# Patient Record
Sex: Female | Born: 1935
Health system: Southern US, Community
[De-identification: ages and names within clinical notes are randomized; demographics above are authoritative.]

## PROBLEM LIST (undated history)

## (undated) DIAGNOSIS — R8281 Pyuria: Secondary | ICD-10-CM

## (undated) DIAGNOSIS — I110 Hypertensive heart disease with heart failure: Secondary | ICD-10-CM

## (undated) DIAGNOSIS — G459 Transient cerebral ischemic attack, unspecified: Secondary | ICD-10-CM

## (undated) DIAGNOSIS — D649 Anemia, unspecified: Secondary | ICD-10-CM

## (undated) DIAGNOSIS — D0512 Intraductal carcinoma in situ of left breast: Secondary | ICD-10-CM

## (undated) DIAGNOSIS — R2990 Unspecified symptoms and signs involving the nervous system: Secondary | ICD-10-CM

## (undated) DIAGNOSIS — S83281A Other tear of lateral meniscus, current injury, right knee, initial encounter: Secondary | ICD-10-CM

## (undated) DIAGNOSIS — E871 Hypo-osmolality and hyponatremia: Secondary | ICD-10-CM

## (undated) DIAGNOSIS — I2510 Atherosclerotic heart disease of native coronary artery without angina pectoris: Secondary | ICD-10-CM

## (undated) DIAGNOSIS — Z9889 Other specified postprocedural states: Secondary | ICD-10-CM

## (undated) DIAGNOSIS — E785 Hyperlipidemia, unspecified: Secondary | ICD-10-CM

## (undated) DIAGNOSIS — N814 Uterovaginal prolapse, unspecified: Secondary | ICD-10-CM

## (undated) DIAGNOSIS — E876 Hypokalemia: Secondary | ICD-10-CM

## (undated) DIAGNOSIS — E118 Type 2 diabetes mellitus with unspecified complications: Secondary | ICD-10-CM

## (undated) DIAGNOSIS — Z6831 Body mass index (BMI) 31.0-31.9, adult: Secondary | ICD-10-CM

## (undated) DIAGNOSIS — S83104A Unspecified dislocation of right knee, initial encounter: Secondary | ICD-10-CM

## (undated) DIAGNOSIS — E119 Type 2 diabetes mellitus without complications: Secondary | ICD-10-CM

## (undated) DIAGNOSIS — I5042 Chronic combined systolic (congestive) and diastolic (congestive) heart failure: Secondary | ICD-10-CM

## (undated) DIAGNOSIS — I10 Essential (primary) hypertension: Secondary | ICD-10-CM

## (undated) DIAGNOSIS — I6523 Occlusion and stenosis of bilateral carotid arteries: Secondary | ICD-10-CM

## (undated) DIAGNOSIS — I639 Cerebral infarction, unspecified: Secondary | ICD-10-CM

## (undated) DIAGNOSIS — K219 Gastro-esophageal reflux disease without esophagitis: Secondary | ICD-10-CM

## (undated) DIAGNOSIS — E669 Obesity, unspecified: Secondary | ICD-10-CM

## (undated) DIAGNOSIS — M25561 Pain in right knee: Secondary | ICD-10-CM

## (undated) DIAGNOSIS — I160 Hypertensive urgency: Secondary | ICD-10-CM

## (undated) DIAGNOSIS — C50919 Malignant neoplasm of unspecified site of unspecified female breast: Secondary | ICD-10-CM

## (undated) HISTORY — DX: Cerebral infarction, unspecified: I63.9

## (undated) HISTORY — DX: Chronic combined systolic (congestive) and diastolic (congestive) heart failure: I50.42

## (undated) HISTORY — PX: BREAST BIOPSY: SHX20

## (undated) HISTORY — PX: EYE SURGERY: SHX253

## (undated) HISTORY — DX: Body mass index (bmi) 31.0-31.9, adult: Z68.31

## (undated) HISTORY — DX: Other specified postprocedural states: Z98.890

## (undated) HISTORY — DX: Obesity, unspecified: E66.9

## (undated) HISTORY — PX: CHOLECYSTECTOMY: SHX55

## (undated) HISTORY — DX: Other tear of lateral meniscus, current injury, right knee, initial encounter: S83.281A

## (undated) HISTORY — DX: Type 2 diabetes mellitus with unspecified complications: E11.8

## (undated) HISTORY — DX: Pyuria: R82.81

## (undated) HISTORY — DX: Atherosclerotic heart disease of native coronary artery without angina pectoris: I25.10

## (undated) HISTORY — DX: Hypokalemia: E87.6

## (undated) HISTORY — DX: Unspecified symptoms and signs involving the nervous system: R29.90

## (undated) HISTORY — DX: Hypertensive heart disease with heart failure: I11.0

## (undated) HISTORY — DX: Unspecified dislocation of right knee, initial encounter: S83.104A

## (undated) HISTORY — PX: CORONARY ANGIOPLASTY WITH STENT PLACEMENT: SHX49

## (undated) HISTORY — DX: Pain in right knee: M25.561

## (undated) HISTORY — DX: Intraductal carcinoma in situ of left breast: D05.12

## (undated) HISTORY — PX: TUBAL LIGATION: SHX77

## (undated) HISTORY — DX: Anemia, unspecified: D64.9

## (undated) HISTORY — DX: Occlusion and stenosis of bilateral carotid arteries: I65.23

## (undated) HISTORY — DX: Uterovaginal prolapse, unspecified: N81.4

## (undated) HISTORY — DX: Hypo-osmolality and hyponatremia: E87.1

## (undated) HISTORY — DX: Hypertensive urgency: I16.0

## (undated) HISTORY — PX: CATARACT EXTRACTION: SUR2

## (undated) HISTORY — DX: Hyperlipidemia, unspecified: E78.5

## (undated) MED FILL — Ferumoxytol Inj 510 MG/17ML (30 MG/ML) (Elemental Fe): INTRAVENOUS | Qty: 17 | Status: AC

---

## 1999-05-20 ENCOUNTER — Encounter: Admission: RE | Admit: 1999-05-20 | Discharge: 1999-05-20 | Payer: Self-pay | Admitting: Obstetrics and Gynecology

## 1999-05-20 ENCOUNTER — Encounter: Payer: Self-pay | Admitting: Obstetrics and Gynecology

## 1999-06-26 ENCOUNTER — Other Ambulatory Visit: Admission: RE | Admit: 1999-06-26 | Discharge: 1999-06-26 | Payer: Self-pay | Admitting: Obstetrics and Gynecology

## 2000-06-07 ENCOUNTER — Encounter: Payer: Self-pay | Admitting: Obstetrics and Gynecology

## 2000-06-07 ENCOUNTER — Encounter: Admission: RE | Admit: 2000-06-07 | Discharge: 2000-06-07 | Payer: Self-pay | Admitting: Obstetrics and Gynecology

## 2000-07-09 ENCOUNTER — Other Ambulatory Visit: Admission: RE | Admit: 2000-07-09 | Discharge: 2000-07-09 | Payer: Self-pay | Admitting: Obstetrics and Gynecology

## 2001-06-08 ENCOUNTER — Encounter: Payer: Self-pay | Admitting: Obstetrics and Gynecology

## 2001-06-08 ENCOUNTER — Encounter: Admission: RE | Admit: 2001-06-08 | Discharge: 2001-06-08 | Payer: Self-pay | Admitting: Obstetrics and Gynecology

## 2002-07-03 ENCOUNTER — Encounter: Payer: Self-pay | Admitting: Obstetrics and Gynecology

## 2002-07-03 ENCOUNTER — Encounter: Admission: RE | Admit: 2002-07-03 | Discharge: 2002-07-03 | Payer: Self-pay | Admitting: Obstetrics and Gynecology

## 2008-07-10 DIAGNOSIS — S32009A Unspecified fracture of unspecified lumbar vertebra, initial encounter for closed fracture: Secondary | ICD-10-CM

## 2008-07-10 HISTORY — DX: Unspecified fracture of unspecified lumbar vertebra, initial encounter for closed fracture: S32.009A

## 2014-03-15 DIAGNOSIS — S83104A Unspecified dislocation of right knee, initial encounter: Secondary | ICD-10-CM

## 2014-03-15 DIAGNOSIS — S83281A Other tear of lateral meniscus, current injury, right knee, initial encounter: Secondary | ICD-10-CM

## 2014-03-15 HISTORY — DX: Other tear of lateral meniscus, current injury, right knee, initial encounter: S83.281A

## 2014-03-15 HISTORY — DX: Unspecified dislocation of right knee, initial encounter: S83.104A

## 2014-05-17 DIAGNOSIS — M25561 Pain in right knee: Secondary | ICD-10-CM | POA: Insufficient documentation

## 2014-05-17 DIAGNOSIS — Z9889 Other specified postprocedural states: Secondary | ICD-10-CM

## 2014-05-17 HISTORY — DX: Other specified postprocedural states: Z98.890

## 2014-05-17 HISTORY — DX: Pain in right knee: M25.561

## 2014-07-17 DIAGNOSIS — I50812 Chronic right heart failure: Secondary | ICD-10-CM | POA: Insufficient documentation

## 2014-07-17 DIAGNOSIS — E785 Hyperlipidemia, unspecified: Secondary | ICD-10-CM

## 2014-07-17 DIAGNOSIS — I2510 Atherosclerotic heart disease of native coronary artery without angina pectoris: Secondary | ICD-10-CM

## 2014-07-17 DIAGNOSIS — I110 Hypertensive heart disease with heart failure: Secondary | ICD-10-CM

## 2014-07-17 DIAGNOSIS — I5042 Chronic combined systolic (congestive) and diastolic (congestive) heart failure: Secondary | ICD-10-CM

## 2014-07-17 HISTORY — DX: Hypertensive heart disease with heart failure: I11.0

## 2014-07-17 HISTORY — DX: Chronic combined systolic (congestive) and diastolic (congestive) heart failure: I50.42

## 2014-07-17 HISTORY — DX: Hyperlipidemia, unspecified: E78.5

## 2014-07-17 HISTORY — DX: Atherosclerotic heart disease of native coronary artery without angina pectoris: I25.10

## 2015-01-02 DIAGNOSIS — Z86000 Personal history of in-situ neoplasm of breast: Secondary | ICD-10-CM | POA: Diagnosis not present

## 2015-05-06 DIAGNOSIS — D0512 Intraductal carcinoma in situ of left breast: Secondary | ICD-10-CM

## 2015-05-06 DIAGNOSIS — Z6831 Body mass index (BMI) 31.0-31.9, adult: Secondary | ICD-10-CM | POA: Insufficient documentation

## 2015-05-06 DIAGNOSIS — E669 Obesity, unspecified: Secondary | ICD-10-CM

## 2015-05-06 HISTORY — DX: Intraductal carcinoma in situ of left breast: D05.12

## 2015-05-06 HISTORY — DX: Body mass index (BMI) 31.0-31.9, adult: Z68.31

## 2015-05-06 HISTORY — DX: Obesity, unspecified: E66.9

## 2015-05-29 ENCOUNTER — Observation Stay (HOSPITAL_COMMUNITY)
Admission: EM | Admit: 2015-05-29 | Discharge: 2015-05-31 | Disposition: A | Discharge status: Home / Self Care | Payer: Medicare PPO | Attending: Internal Medicine | Admitting: Internal Medicine

## 2015-05-29 ENCOUNTER — Encounter (HOSPITAL_COMMUNITY): Payer: Self-pay | Admitting: Neurology

## 2015-05-29 ENCOUNTER — Observation Stay (HOSPITAL_COMMUNITY): Payer: Medicare PPO

## 2015-05-29 DIAGNOSIS — R4781 Slurred speech: Secondary | ICD-10-CM | POA: Diagnosis not present

## 2015-05-29 DIAGNOSIS — E119 Type 2 diabetes mellitus without complications: Secondary | ICD-10-CM | POA: Diagnosis not present

## 2015-05-29 DIAGNOSIS — I639 Cerebral infarction, unspecified: Secondary | ICD-10-CM | POA: Diagnosis not present

## 2015-05-29 DIAGNOSIS — I10 Essential (primary) hypertension: Secondary | ICD-10-CM | POA: Diagnosis not present

## 2015-05-29 DIAGNOSIS — Z8673 Personal history of transient ischemic attack (TIA), and cerebral infarction without residual deficits: Secondary | ICD-10-CM | POA: Insufficient documentation

## 2015-05-29 DIAGNOSIS — Z87891 Personal history of nicotine dependence: Secondary | ICD-10-CM | POA: Insufficient documentation

## 2015-05-29 DIAGNOSIS — Z7984 Long term (current) use of oral hypoglycemic drugs: Secondary | ICD-10-CM | POA: Diagnosis not present

## 2015-05-29 DIAGNOSIS — R531 Weakness: Secondary | ICD-10-CM | POA: Diagnosis present

## 2015-05-29 DIAGNOSIS — E871 Hypo-osmolality and hyponatremia: Secondary | ICD-10-CM

## 2015-05-29 DIAGNOSIS — K219 Gastro-esophageal reflux disease without esophagitis: Secondary | ICD-10-CM | POA: Diagnosis present

## 2015-05-29 DIAGNOSIS — E785 Hyperlipidemia, unspecified: Secondary | ICD-10-CM | POA: Insufficient documentation

## 2015-05-29 DIAGNOSIS — Z79899 Other long term (current) drug therapy: Secondary | ICD-10-CM | POA: Diagnosis not present

## 2015-05-29 DIAGNOSIS — Z853 Personal history of malignant neoplasm of breast: Secondary | ICD-10-CM | POA: Diagnosis not present

## 2015-05-29 DIAGNOSIS — Z7902 Long term (current) use of antithrombotics/antiplatelets: Secondary | ICD-10-CM | POA: Diagnosis not present

## 2015-05-29 DIAGNOSIS — R29818 Other symptoms and signs involving the nervous system: Secondary | ICD-10-CM | POA: Diagnosis not present

## 2015-05-29 DIAGNOSIS — R2990 Unspecified symptoms and signs involving the nervous system: Secondary | ICD-10-CM | POA: Insufficient documentation

## 2015-05-29 DIAGNOSIS — E118 Type 2 diabetes mellitus with unspecified complications: Secondary | ICD-10-CM | POA: Diagnosis not present

## 2015-05-29 HISTORY — DX: Unspecified symptoms and signs involving the nervous system: R29.90

## 2015-05-29 HISTORY — DX: Gastro-esophageal reflux disease without esophagitis: K21.9

## 2015-05-29 HISTORY — DX: Essential (primary) hypertension: I10

## 2015-05-29 HISTORY — DX: Type 2 diabetes mellitus without complications: E11.9

## 2015-05-29 HISTORY — DX: Transient cerebral ischemic attack, unspecified: G45.9

## 2015-05-29 HISTORY — DX: Malignant neoplasm of unspecified site of unspecified female breast: C50.919

## 2015-05-29 HISTORY — DX: Hypo-osmolality and hyponatremia: E87.1

## 2015-05-29 LAB — BASIC METABOLIC PANEL
Anion gap: 12 (ref 5–15)
BUN: 16 mg/dL (ref 6–20)
CO2: 21 mmol/L — ABNORMAL LOW (ref 22–32)
Calcium: 9.2 mg/dL (ref 8.9–10.3)
Chloride: 94 mmol/L — ABNORMAL LOW (ref 101–111)
Creatinine, Ser: 0.81 mg/dL (ref 0.44–1.00)
GFR calc Af Amer: >60 mL/min (ref >60)
GFR calc non Af Amer: >60 mL/min (ref >60)
Glucose, Bld: 106 mg/dL — ABNORMAL HIGH (ref 65–99)
Potassium: 4.5 mmol/L (ref 3.5–5.1)
Sodium: 127 mmol/L — ABNORMAL LOW (ref 135–145)

## 2015-05-29 LAB — LIPID PANEL
CHOLESTEROL: 119 mg/dL (ref 0–200)
HDL: 36 mg/dL — ABNORMAL LOW (ref >40)
LDL Cholesterol: 61 mg/dL (ref 0–99)
TRIGLYCERIDES: 108 mg/dL (ref <150)
Total CHOL/HDL Ratio: 3.3 RATIO
VLDL: 22 mg/dL (ref 0–40)

## 2015-05-29 LAB — GLUCOSE, CAPILLARY: GLUCOSE-CAPILLARY: 136 mg/dL — AB (ref 65–99)

## 2015-05-29 LAB — CBG MONITORING, ED: GLUCOSE-CAPILLARY: 104 mg/dL — AB (ref 65–99)

## 2015-05-29 LAB — OSMOLALITY: Osmolality: 273 mOsm/kg — ABNORMAL LOW (ref 275–295)

## 2015-05-29 MED ORDER — CLOPIDOGREL BISULFATE 75 MG PO TABS
75.0000 mg | ORAL_TABLET | Freq: Every morning | ORAL | Status: DC
  Administered 2015-05-30 – 2015-05-31 (×2): 75 mg via ORAL
  Filled 2015-05-29 (×2): qty 1

## 2015-05-29 MED ORDER — ROSUVASTATIN CALCIUM 10 MG PO TABS
10.0000 mg | ORAL_TABLET | ORAL | Status: DC
  Administered 2015-05-31: 10 mg via ORAL
  Filled 2015-05-29: qty 1

## 2015-05-29 MED ORDER — SODIUM CHLORIDE 0.9 % IV SOLN
INTRAVENOUS | Status: AC
  Administered 2015-05-29: 21:00:00 via INTRAVENOUS

## 2015-05-29 MED ORDER — STROKE: EARLY STAGES OF RECOVERY BOOK
Freq: Once | Status: DC
  Filled 2015-05-29 (×2): qty 1

## 2015-05-29 MED ORDER — INSULIN ASPART 100 UNIT/ML ~~LOC~~ SOLN
0.0000 [IU] | Freq: Three times a day (TID) | SUBCUTANEOUS | Status: DC
  Administered 2015-05-30: 1 [IU] via SUBCUTANEOUS

## 2015-05-29 MED ORDER — ACETAMINOPHEN 325 MG PO TABS: 650.0000 mg | ORAL_TABLET | ORAL | Status: DC | PRN

## 2015-05-29 MED ORDER — PANTOPRAZOLE SODIUM 20 MG PO TBEC
20.0000 mg | DELAYED_RELEASE_TABLET | Freq: Every day | ORAL | Status: DC
  Administered 2015-05-30 – 2015-05-31 (×2): 20 mg via ORAL
  Filled 2015-05-29 (×2): qty 1

## 2015-05-29 MED ORDER — SENNOSIDES-DOCUSATE SODIUM 8.6-50 MG PO TABS
1.0000 | ORAL_TABLET | Freq: Every evening | ORAL | Status: DC | PRN
  Administered 2015-05-30: 1 via ORAL
  Filled 2015-05-29: qty 1

## 2015-05-29 MED ORDER — IOPAMIDOL (ISOVUE-370) INJECTION 76%
INTRAVENOUS | Status: AC
  Administered 2015-05-29: 50 mL
  Filled 2015-05-29: qty 50

## 2015-05-29 MED ORDER — HYDRALAZINE HCL 20 MG/ML IJ SOLN: 5.0000 mg | INTRAMUSCULAR | Status: DC | PRN

## 2015-05-29 MED ORDER — ACETAMINOPHEN 650 MG RE SUPP: 650.0000 mg | RECTAL | Status: DC | PRN

## 2015-05-29 MED ORDER — INSULIN ASPART 100 UNIT/ML ~~LOC~~ SOLN: 0.0000 [IU] | Freq: Every day | SUBCUTANEOUS | Status: DC

## 2015-05-29 NOTE — ED Provider Notes (Signed)
CSN: [id number]     Arrival date & time 05/29/15  1518 History   None    Chief Complaint  Patient presents with  . Stroke Symptoms     (Consider location/radiation/quality/duration/timing/severity/associated sxs/prior Treatment) Patient is a 82 y.o. female presenting with general illness. The history is provided by the patient and medical records.  Illness Location:  Stroke like symptoms Severity:  Moderate Onset quality:  Sudden Duration:  1 hour Timing:  Constant Progression:  Resolved Chronicity:  New Context:  Patient had acute onset of right hand weakness and slurred speech at 11 AM. Last 1 hour. Spontaneously resolved. Went outside [place]. CT head without acute abnormality. Sodium down to be 127. Patient does have a history of chronic hyponatremia. Sent here. Associated symptoms: no abdominal pain, no chest pain, no cough, no diarrhea, no fever, no headaches, no nausea, no shortness of breath and no vomiting     Past Medical History  Diagnosis Date  . Hypertension   . Diabetes mellitus without complication ([place])   . TIA (transient ischemic attack)   . GERD (gastroesophageal reflux disease)   . Breast cancer [facility])    Past Surgical History  Procedure Laterality Date  . Cholecystectomy    . Tubal ligation    . Eye surgery    . Cataract extraction     No family history on file. Social History  Substance Use Topics  . Smoking status: Former Research scientist (life sciences)  . Smokeless tobacco: None  . Alcohol Use: No   OB History    No data available     Review of Systems  Constitutional: Negative for fever and chills.  Respiratory: Negative for cough and shortness of breath.   Cardiovascular: Negative for chest pain.  Gastrointestinal: Negative for nausea, vomiting, abdominal pain and diarrhea.  Musculoskeletal: Negative for back pain and neck pain.  Neurological: Positive for speech difficulty and weakness. Negative for syncope, facial asymmetry, light-headedness, numbness  and headaches.  All other systems reviewed and are negative.     Allergies  Antihistamines, diphenhydramine-type; Aspirin; Lipitor; Penicillins; and Allopurinol  Home Medications   Prior to Admission medications   Not on File   BP 194/66 mmHg  Pulse 56  Temp(Src) 98.7 F (37.1 C) (Oral)  Resp 18  SpO2 100% Physical Exam  Constitutional: She is oriented to person, place, and time. She appears well-developed and well-nourished. No distress.  HENT:  Head: Normocephalic and atraumatic.  Eyes: EOM are normal. Pupils are equal, round, and reactive to light.  Neck: Normal range of motion. No rigidity. Normal range of motion present.  Cardiovascular: Normal rate and regular rhythm.   Pulmonary/Chest: No tachypnea. No respiratory distress.  Abdominal: Soft. Normal appearance. There is no tenderness.  Neurological: She is alert and oriented to person, place, and time. She has normal strength and normal reflexes. No cranial nerve deficit or sensory deficit. She displays a negative Romberg sign. Coordination normal. GCS eye subscore is 4. GCS verbal subscore is 5. GCS motor subscore is 6.  Patient does have some decreased strength in her left lower extremity compared to her right lower extremity; she reports this is consistent with her baseline since she had polio when she was a child.  Skin: Skin is warm and dry.    ED Course  Procedures (including critical care time) Labs Review Labs Reviewed  BASIC METABOLIC PANEL    Imaging Review No results found. I have personally reviewed and evaluated these images and lab results as part of my  medical decision-making.   EKG Interpretation None      MDM   Final diagnoses:  Stroke-like symptom  Stroke-like symptom    Transfer from OSH. R hand weakness and change in speech, lasted 1 hr, resolved. CTH at OSH - normal. Na at OSH - 127. Sent here.   Symptom-free on arrival. No abnormal findings on exam. Afebrile and well appearing. No  meningeal signs. No HA. No n/v. Doubt intracranial bleed, meningitis or encephalitis.   Spoke with neurology Dr Silverio Decamp, who agreed with plan to get CTA H+N, and to admit to hospitalist for TIA r/o.  Maryan Puls, MD 05/29/15 2035  Leo Grosser, MD 05/30/15 [phone]

## 2015-05-29 NOTE — Consult Note (Signed)
Requesting Physician: Dr. Laneta Simmers    Chief Complaint: CVA    HPI:                                                                                                                                         Marilyn Robinson is an 82 y.o. female who was drinking her coffee at 10:45 and noted she was unable to control her right hand and then noted she had slurred speech.  She called her daughter who called EMS and she was brought to [facility]. She was then transferred to [facility] ED. Her symptoms resolved in about one hour and she is asymptomatic. She has had similar symptoms in 2010 but on the right arm and left leg. This was attributed to hyponatremia. She does take Plavix daily  Date last known well: 5.17.2017 Time last known well: Time: 10:45 tPA Given: No: resolved   Past Medical History  Diagnosis Date  . Hypertension   . Diabetes mellitus without complication ([place])   . TIA (transient ischemic attack)   . GERD (gastroesophageal reflux disease)   . Breast cancer [facility])     Past Surgical History  Procedure Laterality Date  . Cholecystectomy    . Tubal ligation    . Eye surgery    . Cataract extraction      No family history on file. Social History:  reports that she has quit smoking. She does not have any smokeless tobacco history on file. She reports that she does not drink alcohol. Her drug history is not on file.  Allergies:  Allergies  Allergen Reactions  . Antihistamines, Diphenhydramine-Type Other (See Comments)    Causes headaches  . Aspirin Nausea And Vomiting  . Lipitor [Atorvastatin] Other (See Comments)    Caused muscle weakness, but can tolerate a low dose of Crestor  . Penicillins     Has patient had a PCN reaction causing immediate rash, facial/tongue/throat swelling, SOB or lightheadedness with hypotension: Yes Has patient had a PCN reaction causing severe rash involving [id number] membranes or skin necrosis: No Has patient had a PCN reaction that  required hospitalization: No Has patient had a PCN reaction occurring within the last 10 years: No If all of the above answers are "NO", then may proceed with Cephalosporin use.   . Allopurinol Rash    Medications:  No current facility-administered medications for this encounter.   No current outpatient prescriptions on file.     ROS:                                                                                                                                       History obtained from the patient  General ROS: negative for - chills, fatigue, fever, night sweats, weight gain or weight loss Psychological ROS: negative for - behavioral disorder, hallucinations, memory difficulties, mood swings or suicidal ideation Ophthalmic ROS: negative for - blurry vision, double vision, eye pain or loss of vision ENT ROS: negative for - epistaxis, nasal discharge, oral lesions, sore throat, tinnitus or vertigo Allergy and Immunology ROS: negative for - hives or itchy/watery eyes Hematological and Lymphatic ROS: negative for - bleeding problems, bruising or swollen lymph nodes Endocrine ROS: negative for - galactorrhea, hair pattern changes, polydipsia/polyuria or temperature intolerance Respiratory ROS: negative for - cough, hemoptysis, shortness of breath or wheezing Cardiovascular ROS: negative for - chest pain, dyspnea on exertion, edema or irregular heartbeat Gastrointestinal ROS: negative for - abdominal pain, diarrhea, hematemesis, nausea/vomiting or stool incontinence Genito-Urinary ROS: negative for - dysuria, hematuria, incontinence or urinary frequency/urgency Musculoskeletal ROS: negative for - joint swelling or muscular weakness Neurological ROS: as noted in HPI Dermatological ROS: negative for rash and skin lesion changes  Neurologic Examination:                                                                                                       Blood pressure 194/66, pulse 56, temperature 98.7 F (37.1 C), temperature source Oral, resp. rate 18, SpO2 100 %.  HEENT-  Normocephalic, no lesions, without obvious abnormality.  Normal external eye and conjunctiva.  Normal TM's bilaterally.  Normal auditory canals and external ears. Normal external nose, mucus membranes and septum.  Normal pharynx. Cardiovascular- S1, S2 normal, pulses palpable throughout   Lungs- chest clear, no wheezing, rales, normal symmetric air entry Abdomen- normal findings: bowel sounds normal Extremities- no edema Lymph-no adenopathy palpable Musculoskeletal-no joint tenderness, deformity or swelling Skin-warm and dry, no hyperpigmentation, vitiligo, or suspicious lesions  Neurological Examination Mental Status: Alert, oriented, thought content appropriate.  Speech fluent without evidence of aphasia.  Able to follow 3 step commands without difficulty. Cranial Nerves: II: Visual fields grossly normal, pupils equal, round, reactive to light and accommodation III,IV, VI: ptosis not present, extra-ocular motions intact bilaterally V,VII: smile asymmetric on the left (old bells), facial light touch  sensation normal bilaterally VIII: hearing normal bilaterally IX,X: uvula rises symmetrically XI: bilateral shoulder shrug XII: midline tongue extension Motor: Right : Upper extremity   5/5    Left:     Upper extremity   5/5  Lower extremity   4/5     Lower extremity   4/5 Tone and bulk:normal tone throughout; no atrophy noted Sensory: Pinprick and light touch intact throughout, bilaterally Deep Tendon Reflexes: 1+ and symmetric throughout Plantars: Right: downgoing   Left: downgoing Cerebellar: normal finger-to-nose Gait: not tested       Lab Results: Basic Metabolic Panel: No results for input(s): NA, K, CL, CO2, GLUCOSE, BUN, CREATININE, CALCIUM, MG, PHOS in the last 168  hours.  Liver Function Tests: No results for input(s): AST, ALT, ALKPHOS, BILITOT, PROT, ALBUMIN in the last 168 hours. No results for input(s): LIPASE, AMYLASE in the last 168 hours. No results for input(s): AMMONIA in the last 168 hours.  CBC: No results for input(s): WBC, NEUTROABS, HGB, HCT, MCV, PLT in the last 168 hours.  Cardiac Enzymes: No results for input(s): CKTOTAL, CKMB, CKMBINDEX, TROPONINI in the last 168 hours.  Lipid Panel: No results for input(s): CHOL, TRIG, HDL, CHOLHDL, VLDL, LDLCALC in the last 168 hours.  CBG: No results for input(s): GLUCAP in the last 168 hours.  Microbiology: No results found for this or any previous visit.  Coagulation Studies: No results for input(s): LABPROT, INR in the last 72 hours.  Imaging: No results found.     Assessment and plan discussed with with attending physician and they are in agreement.    Etta Quill PA-C [facility] [phone]  05/29/2015, 3:54 PM   Assessment: 82 y.o. female with transient slurred speech and right hand clumsiness. This has fully fully resolved. Ca nnot rule out TIA.   Stroke Risk Factors - diabetes mellitus and hypertension  1. HgbA1c, fasting lipid panel 2.  CTA head and neck, MRI brain 3. PT consult, OT consult, Speech consult 4. Echocardiogram 5. Prophylactic therapy-Antiplatelet med: Plavix - dose 75 mg daily 6. Risk factor modification 7. Telemetry monitoring 8. Frequent neuro checks 9 NPO until passes stroke swallow screen 10 please page stroke NP  Or  PA  Or MD from 8am -4 pm  as this patient from this time will be  followed by the stroke.   You can look them up on [email or web address]  Password [email or web address]

## 2015-05-29 NOTE — ED Notes (Signed)
Attempted report 

## 2015-05-29 NOTE — ED Notes (Signed)
Patient transported to X-ray 

## 2015-05-29 NOTE — ED Notes (Signed)
Per ems- Pt was LSN at 1055 while talking on the phone noticed her right hand wouldn't work right, and wouldn't do what she wanted it to do. Also had slurred speech. BP was 200, given 10 mg labetalol, recent BP [id number] systolic. Pt is a x 4. No deficits at current.

## 2015-05-29 NOTE — H&P (Signed)
[facility] History and Physical  Marilyn Robinson [id number] DOB: Nov 23, 1933 DOA: 05/29/2015  Referring physician:  PCP: No primary care provider on file. [place]  Chief Complaint:   HPI: Marilyn Robinson is a delightful 82 y.o. female with past medical history that includes hyponatremia, hypertension, diabetes, GERD since emergency department from [place] and [place] with the chief complaint of slurred speech. Initial evaluation concerning for CVA so patient was transported to the [facility] emergency department.  Information is obtained from the patient and the chart. She states she was in her usual state of health until this morning around 1045 when she noted difficulty maneuvering her right hand. She states it was week she had difficulty holding her coffee cup or using the mouse on her computer. Associated symptoms include slurred speech and generalized weakness. She denied any weakness of her right arm shoulder right leg right foot. She denied any gait disturbances. She denied headache visual disturbances numbness or tingling of extremities. She denies chest pain palpitation headache syncope or near-syncope. She denies abdominal pain nausea vomiting dysuria hematuria frequency or urgency. She does report some intermittent constipation. States she called her daughter who called EMS and the patient was taken to [facility]. The time of admission symptoms are resolved. She reports having had a similar episode 7 years ago and right arm and left leg that was attributed to low sodium. Reports this episode lasted about an hour.  Emergency department she's afebrile hemodynamically stable and not hypoxic. Volume [place] chest CT of the head that showed no acute abnormality EKG without acute changes with bradycardia. Lab work revealed a hyponatremia   Review of Systems:  10 point review of systems complete and all systems are negative except as indicated in the history of present  illness   Past Medical History  Diagnosis Date  . Hypertension   . Diabetes mellitus without complication ([place])   . TIA (transient ischemic attack)   . GERD (gastroesophageal reflux disease)   . Breast cancer [facility])    Past Surgical History  Procedure Laterality Date  . Cholecystectomy    . Tubal ligation    . Eye surgery    . Cataract extraction     Social History:  reports that she has quit smoking. She does not have any smokeless tobacco history on file. She reports that she does not drink alcohol. Her drug history is not on file. To his home alone she is independent with ADLs she ambulates unassisted Allergies  Allergen Reactions  . Antihistamines, Diphenhydramine-Type Other (See Comments)    Causes headaches  . Aspirin Nausea And Vomiting  . Lipitor [Atorvastatin] Other (See Comments)    Caused muscle weakness, but can tolerate a low dose of Crestor  . Penicillins     Has patient had a PCN reaction causing immediate rash, facial/tongue/throat swelling, SOB or lightheadedness with hypotension: Yes Has patient had a PCN reaction causing severe rash involving [id number] membranes or skin necrosis: No Has patient had a PCN reaction that required hospitalization: No Has patient had a PCN reaction occurring within the last 10 years: No If all of the above answers are "NO", then may proceed with Cephalosporin use.   . Allopurinol Rash    No family history on file. family medical hx reviewed and non contributory to admission of this elderly lady  Prior to Admission medications   Medication Sig Start Date End Date Taking? Authorizing Provider  acetaminophen (TYLENOL) 500 MG tablet Take 500 mg by mouth every  6 (six) hours as needed for mild pain.   Yes Historical Provider, MD  carvedilol (COREG) 25 MG tablet Take 37.5 mg by mouth 2 (two) times daily.   Yes Historical Provider, MD  celecoxib (CELEBREX) 200 MG capsule Take 200 mg by mouth daily as needed for mild pain.  05/17/14   Yes Historical Provider, MD  clopidogrel (PLAVIX) 75 MG tablet Take 75 mg by mouth every morning. 01/08/14  Yes Historical Provider, MD  cyanocobalamin (,VITAMIN B-12,) 1000 MCG/ML injection Inject 1 mL into the muscle every 30 (thirty) days.   Yes Historical Provider, MD  fluconazole (DIFLUCAN) 100 MG tablet Take 100 mg by mouth daily. 05/20/15  Yes Historical Provider, MD  furosemide (LASIX) 20 MG tablet Take 20 mg by mouth daily as needed.   Yes Historical Provider, MD  hydrALAZINE (APRESOLINE) 10 MG tablet Take 5 mg by mouth 2 (two) times daily.   Yes Historical Provider, MD  losartan (COZAAR) 50 MG tablet Take 50 mg by mouth 2 (two) times daily. 02/06/14  Yes Historical Provider, MD  metFORMIN (GLUCOPHAGE-XR) 500 MG 24 hr tablet Take 500-1,000 mg by mouth 2 (two) times daily. 500 mg in the morning and 1,000 at night 12/04/13  Yes Historical Provider, MD  pantoprazole (PROTONIX) 20 MG tablet Take 20 mg by mouth daily before breakfast.   Yes Historical Provider, MD  rosuvastatin (CRESTOR) 10 MG tablet Take 10 mg by mouth every other day. 01/26/14   Historical Provider, MD   Physical Exam: Filed Vitals:   05/29/15 1529 05/29/15 1600  BP: 194/66 156/65  Pulse: 56 57  Temp: 98.7 F (37.1 C)   TempSrc: Oral   Resp: 18 12  SpO2: 100% 100%    Wt Readings from Last 3 Encounters:  No data found for Wt    General:  Appears calm and comfortable, sitting up in bed Eyes: PERRL, normal lids, irises & conjunctiva ENT: grossly normal hearing, lips & tongue, his membranes of her mouth are moist and pink Neck: no LAD, masses or thyromegaly Cardiovascular: RRR, no m/r/g. No LE edema. Pedal pulses present and palpable  Respiratory: CTA bilaterally, no w/r/r. Normal respiratory effort. Abdomen: soft, ntnd positive bowel sounds no guarding or rebounding Skin: no rash or induration seen on limited exam Musculoskeletal: grossly normal tone BUE/BLE, joints without swelling/erythema Psychiatric: grossly  normal mood and affect, speech fluent and appropriate Neurologic: grossly non-focal. Speech clear facial symmetry tongue midline. Bilateral grip 5 out of 5 lower extremity strength bilaterally 5 out of 5 no pronator drift sensation intact          Labs on Admission:  Basic Metabolic Panel: No results for input(s): NA, K, CL, CO2, GLUCOSE, BUN, CREATININE, CALCIUM, MG, PHOS in the last 168 hours. Liver Function Tests: No results for input(s): AST, ALT, ALKPHOS, BILITOT, PROT, ALBUMIN in the last 168 hours. No results for input(s): LIPASE, AMYLASE in the last 168 hours. No results for input(s): AMMONIA in the last 168 hours. CBC: No results for input(s): WBC, NEUTROABS, HGB, HCT, MCV, PLT in the last 168 hours. Cardiac Enzymes: No results for input(s): CKTOTAL, CKMB, CKMBINDEX, TROPONINI in the last 168 hours.  BNP (last 3 results) No results for input(s): BNP in the last 8760 hours.  ProBNP (last 3 results) No results for input(s): PROBNP in the last 8760 hours.  CBG: No results for input(s): GLUCAP in the last 168 hours.  Radiological Exams on Admission: No results found.  EKG:   Assessment/Plan Principal Problem:  Stroke-like episode ([place]) Active Problems:   Hyponatremia   Hypertension   Diabetes mellitus without complication ([facility])   GERD (gastroesophageal reflux disease)  #1. Stroke like episode. History of same. Risk factors include hypertension and diabetes and currently has hyponatremia. CT head completed at [facility] reveals no acute abnormality. Slurred speech and right hand weakness resolved within one hour. Evaluated by neurology in the emergency department who recommend TIA workup -Admit to telemetry -MRI of the brain -CTA head and neck -2-D echo -Lipid panel and hemoglobin A1c -Continue home Plavix -Physical therapy, occupational therapy -She passed bedside swallow eval so we'll provide heart healthy carb modified diet -Continue statin  #2. Hyponatremia.  Sodium 127 her [employer]. Patient reports a history of hyponatremia is unsure of baseline. Home medications to include Lasix -Gentle IV fluids -Serum osmolality -urine sodium and osmolality -recheck in am  #3. Hypertension. Fair control while in the emergency department. Home medications include Coreg, Lasix, hydralazine, losartan. -We will hold these for now. Will likely start with hydralazine when indicated -Hold Lasix due to #2 hold beta blocker secondary to intermittent bradycardia -Monitor closely  4. Diabetes. She is on metformin at home. Serum glucose 130 on admission -We'll hold metformin for now -Carb modified diet -Obtain a hemoglobin A1c -Use sliding scale insulin for optimal control  #5. GERD. Stable at baseline. She reports she hasn't had her PPI in over a week. -Continue home medications   neurology  Code Status: limited DVT Prophylaxis: Family Communication: daughter at bedside Disposition Plan: home when ready  Time spent: 1 minutes  [facility]

## 2015-05-30 ENCOUNTER — Observation Stay (HOSPITAL_COMMUNITY): Payer: Medicare PPO

## 2015-05-30 DIAGNOSIS — R29818 Other symptoms and signs involving the nervous system: Secondary | ICD-10-CM | POA: Diagnosis not present

## 2015-05-30 DIAGNOSIS — I639 Cerebral infarction, unspecified: Secondary | ICD-10-CM | POA: Diagnosis not present

## 2015-05-30 DIAGNOSIS — E119 Type 2 diabetes mellitus without complications: Secondary | ICD-10-CM

## 2015-05-30 DIAGNOSIS — R4182 Altered mental status, unspecified: Secondary | ICD-10-CM | POA: Diagnosis not present

## 2015-05-30 DIAGNOSIS — E871 Hypo-osmolality and hyponatremia: Secondary | ICD-10-CM | POA: Diagnosis not present

## 2015-05-30 DIAGNOSIS — I10 Essential (primary) hypertension: Secondary | ICD-10-CM | POA: Diagnosis not present

## 2015-05-30 DIAGNOSIS — K219 Gastro-esophageal reflux disease without esophagitis: Secondary | ICD-10-CM | POA: Diagnosis not present

## 2015-05-30 LAB — ECHOCARDIOGRAM COMPLETE: WEIGHTICAEL: 2912 [oz_av]

## 2015-05-30 LAB — GLUCOSE, CAPILLARY
GLUCOSE-CAPILLARY: 119 mg/dL — AB (ref 65–99)
Glucose-Capillary: 144 mg/dL — ABNORMAL HIGH (ref 65–99)
Glucose-Capillary: 110 mg/dL — ABNORMAL HIGH (ref 65–99)
Glucose-Capillary: 218 mg/dL — ABNORMAL HIGH (ref 65–99)

## 2015-05-30 LAB — BASIC METABOLIC PANEL
Anion gap: 10 (ref 5–15)
BUN: 12 mg/dL (ref 6–20)
CALCIUM: 8.8 mg/dL — AB (ref 8.9–10.3)
CO2: 21 mmol/L — ABNORMAL LOW (ref 22–32)
CREATININE: 0.79 mg/dL (ref 0.44–1.00)
Chloride: 94 mmol/L — ABNORMAL LOW (ref 101–111)
GFR calc Af Amer: >60 mL/min (ref >60)
GLUCOSE: 189 mg/dL — AB (ref 65–99)
Potassium: 4.3 mmol/L (ref 3.5–5.1)
Sodium: 125 mmol/L — ABNORMAL LOW (ref 135–145)

## 2015-05-30 LAB — HEMOGLOBIN A1C
Hgb A1c MFr Bld: 7.2 % — ABNORMAL HIGH (ref 4.8–5.6)
MEAN PLASMA GLUCOSE: 160 mg/dL

## 2015-05-30 MED ORDER — SODIUM CHLORIDE 0.9 % IV SOLN
INTRAVENOUS | Status: DC
  Administered 2015-05-30 – 2015-05-31 (×2): via INTRAVENOUS

## 2015-05-30 MED ORDER — ASPIRIN EC 81 MG PO TBEC
81.0000 mg | DELAYED_RELEASE_TABLET | Freq: Every day | ORAL | Status: DC
  Administered 2015-05-30 – 2015-05-31 (×2): 81 mg via ORAL
  Filled 2015-05-30 (×2): qty 1

## 2015-05-30 NOTE — Progress Notes (Signed)
STROKE TEAM PROGRESS NOTE   HISTORY OF PRESENT ILLNESS Marilyn Robinson is an 82 y.o. female who was drinking her coffee at 10:45 05/29/2015 (LKW) and noted she was unable to control her right hand and then noted she had slurred speech. She called her daughter who called EMS and she was brought to [facility]. She was then transferred to [facility] ED. Her symptoms resolved in about one hour and she is asymptomatic. She has had similar symptoms in 2010 but on the right arm and left leg. This was attributed to hyponatremia. She does take Plavix daily. Patient was not administered IV t-PA secondary to resolved symptoms. She was admitted for further evaluation and treatment.   SUBJECTIVE (INTERVAL HISTORY) Her daughter is at the bedside.  Overall she feels her condition is completely resolved. She has right hand weakness with word finding difficulty and then generalized weakness lasted one hour yesterday. Her Na 125. Not sure about her Na baseline. She also had similar episode in 2010 with whole body weakness and difficulty words out. At that time her Na was low.    OBJECTIVE Temp:  [98.2 F (36.8 C)-98.9 F (37.2 C)] 98.9 F (37.2 C) (05/18 0758) Pulse Rate:  [51-65] 61 (05/18 0918) Cardiac Rhythm:  [-] Normal sinus rhythm (05/18 0704) Resp:  [12-19] 18 (05/18 0918) BP: (124-194)/(43-70) 155/51 mmHg (05/18 0918) SpO2:  [95 %-100 %] 95 % (05/18 0918) Weight:  [82.555 kg (182 lb)] 82.555 kg (182 lb) (05/17 2108)  CBC: No results for input(s): WBC, NEUTROABS, HGB, HCT, MCV, PLT in the last 168 hours.  Basic Metabolic Panel:  Recent Labs Lab 05/29/15 1549  NA 127*  K 4.5  CL 94*  CO2 21*  GLUCOSE 106*  BUN 16  CREATININE 0.81  CALCIUM 9.2    Lipid Panel:    Component Value Date/Time   CHOL 119 05/29/2015 1549   TRIG 108 05/29/2015 1549   HDL 36* 05/29/2015 1549   CHOLHDL 3.3 05/29/2015 1549   VLDL 22 05/29/2015 1549   LDLCALC 61 05/29/2015 1549   HgbA1c:  Lab Results   Component Value Date   HGBA1C 7.2* 05/29/2015   Urine Drug Screen: No results found for: LABOPIA, COCAINSCRNUR, LABBENZ, AMPHETMU, THCU, LABBARB    IMAGING I have personally reviewed the radiological images below and agree with the radiology interpretations.  Ct Angio Head & Neck W/cm &/or Wo/cm 05/29/2015  1. Moderate stenosis at the origin of the left common carotid artery. 2. Approximately 50% stenosis at the origin of the right internal carotid artery with atherosclerotic disease and a focal ulceration. A 3 mm pseudoaneurysm is present. 3. Atherosclerotic calcifications and ulceration at the proximal left internal carotid artery without a significant stenosis. 4. Atherosclerotic disease within the cavernous internal carotid arteries bilaterally without significant stenosis.  Mr Brain Wo Contrast 05/29/2015  1. No acute intracranial abnormality to explain the patient's acute symptoms. 2. Advanced diffuse white matter disease with multiple remote lacunar infarcts compatible with chronic microvascular ischemic change. 3. Moderate generalized atrophy.   TTE - - Left ventricle: The cavity size was normal. Wall thickness was  increased in a pattern of moderate LVH. There was moderate  concentric hypertrophy. Systolic function was normal. The  estimated ejection fraction was in the range of 55% to 60%. Wall  motion was normal; there were no regional wall motion  abnormalities. - Mitral valve: Mildly calcified annulus. There was mild  regurgitation. - Left atrium: The atrium was mildly dilated. Impressions: - No  cardiac source of emboli was indentified.  EEG pending  PHYSICAL EXAM  Temp:  [97.9 F (36.6 C)-98.9 F (37.2 C)] 98 F (36.7 C) (05/18 1800) Pulse Rate:  [51-88] 88 (05/18 1800) Resp:  [13-19] 18 (05/18 1800) BP: (124-166)/(43-78) 152/78 mmHg (05/18 1407) SpO2:  [95 %-100 %] 99 % (05/18 1800) Weight:  [182 lb (82.555 kg)] 182 lb (82.555 kg) (05/17 2108)  General -  Well nourished, well developed, in no apparent distress.  Ophthalmologic - Fundi not visualized due to eye movement.  Cardiovascular - Regular rate and rhythm.  Mental Status -  Level of arousal and orientation to time, place, and person were intact. Language including expression, naming, repetition, comprehension was assessed and found intact.  Cranial Nerves II - XII - II - Visual field intact OU. III, IV, VI - Extraocular movements intact. V - Facial sensation intact bilaterally. VII - Facial movement intact bilaterally. VIII - Hearing & vestibular intact bilaterally. X - Palate elevates symmetrically. XI - Chin turning & shoulder shrug intact bilaterally. XII - Tongue protrusion intact.  Motor Strength - The patient's strength was normal in all extremities and pronator drift was absent.  Bulk was normal and fasciculations were absent.   Motor Tone - Muscle tone was assessed at the neck and appendages and was normal.  Reflexes - The patient's reflexes were 1+ in all extremities and she had no pathological reflexes.  Sensory - Light touch, temperature/pinprick were assessed and were symmetrical.    Coordination - The patient had normal movements in the hands and feet with no ataxia or dysmetria.  Tremor was absent.  Gait and Station - not tested due to safety concerns.   ASSESSMENT/PLAN Ms. Marilyn Robinson is a 82 y.o. female with history of HTN, DM, TIA, GERD, breast cancer presenting with transient slurred speech and right hand clumsiness. She did not receive IV t-PA due to deficits resolved.   Left brain cortical TIA - with right hand weakness and aphasia. Pt has similar episode in 2010. Recommend 30 day cardiac event monitoring.  Resultant  Deficit resolved  MRI  No acute stroke, but microbleeds left BG  CTA H&N  R ICA 50% w/ ulceration , L ICA ulceration without stenosis  2D Echo  EF 55-60%  Recommend 30 day cardiac event monitoring as outpt  LDL 61  HgbA1c  7.2  SCDs for VTE prophylaxis.   Diet Heart Room service appropriate?: Yes; Fluid consistency:: Thin  clopidogrel 75 mg daily prior to admission, now on clopidogrel 75 mg daily. Due to intracranial stenosis, we recommend DAPT. However, pt seems not able to tolerate ASA in the past, but she is willing to try again ASA EC 81mg  for 3 months together with plavix. After 3 months, plavix alone.  Patient counseled to be compliant with her antithrombotic medications  Ongoing aggressive stroke risk factor management  Therapy recommendations:  pending  Disposition:  Pending  Hyponatremia  Pt stated that her first episode in 2010 was related with low Na  This admission, Na 125  Pt Na 10/2012 was 132 and 01/2015 was 130  EEG pending to rule out seizure due to low Na  Hypertension  Stable  Permissive hypertension (OK if < 220/120) but gradually normalize in 5-7 days  Hyperlipidemia  Home meds:  crestor 10, resumed in hospital  LDL 61, at goal < 70  Continue statin at discharge  Diabetes  HgbA1c 7.2 , at goal < 7.0  Controlled  SSI   CBG monitoring  Other Stroke Risk Factors  Advanced age  Former Cigarette smoker  Hx stroke/TIA  2010 word finding difficulty with generalized weakness  Other Active Problems  Hyponatremia  GERD  Hospital day # 1  Rosalin Hawking, MD PhD Stroke Neurology 05/30/2015 6:25 PM      To contact Stroke Continuity provider, please refer to [email or web address] After hours, contact General Neurology

## 2015-05-30 NOTE — Progress Notes (Signed)
EEG completed; results pending.    

## 2015-05-30 NOTE — Progress Notes (Signed)
Echocardiogram 2D Echocardiogram has been performed.  Marilyn Robinson 05/30/2015, 1:02 PM

## 2015-05-30 NOTE — Progress Notes (Signed)
PROGRESS NOTE    Marilyn Robinson  [id number] DOB: November 02, 1933 DOA: 05/29/2015 PCP: No primary care provider on file.   Brief Narrative:  On 05/29/2015 by Ms. Dyanne Carrel, NP Marilyn Robinson is a delightful 82 y.o. female with past medical history that includes hyponatremia, hypertension, diabetes, GERD since emergency department from [facility] and [place] with the chief complaint of slurred speech. Initial evaluation concerning for CVA so patient was transported to the [facility] emergency department.  Information is obtained from the patient and the chart. She states she was in her usual state of health until this morning around 1045 when she noted difficulty maneuvering her right hand. She states it was week she had difficulty holding her coffee cup or using the mouse on her computer. Associated symptoms include slurred speech and generalized weakness. She denied any weakness of her right arm shoulder right leg right foot. She denied any gait disturbances. She denied headache visual disturbances numbness or tingling of extremities. She denies chest pain palpitation headache syncope or near-syncope. She denies abdominal pain nausea vomiting dysuria hematuria frequency or urgency. She does report some intermittent constipation. States she called her daughter who called EMS and the patient was taken to [facility]. The time of admission symptoms are resolved. She reports having had a similar episode 7 years ago and right arm and left leg that was attributed to low sodium. Reports this episode lasted about an hour.  Emergency department she's afebrile hemodynamically stable and not hypoxic. Volume [place] chest CT of the head that showed no acute abnormality EKG without acute changes with bradycardia. Lab work revealed a hyponatremia Assessment & Plan   Slurred speech/right-hand weakness, suspect TIA versus CVA -CT head showed no acute abnormality -MRI brain: No acute intracranial  abnormality, advanced diffuse white matter disease with multiple remote lacunar infarcts -Neurology consulted and appreciated -Echocardiogram pending -Continue Plavix, statin -LDL 61 -Hemoglobin A1c 7.2  Hyponatremia -Sodium 125 -Will obtain TSH  Diabetes mellitus, type II -Metformin held -Placed on insulin sliding scale CBG monitoring -Hemoglobin A1c 7.2  Essential hypertension -Home medications held, allowing for permissive hypertension  GERD -Continue PPI  DVT Prophylaxis    Code Status: Limited: DNI/DNR, only BiPAP  Family Communication: Daughter at bedsdie  Disposition Plan: Admitted. Pending further workup  Consultants Neurology  Procedures  Echocardiogram  Antibiotics   Anti-infectives    None      Subjective:   Marilyn Robinson seen and examined today.  Patient denies any further confusion, speech problems or right hand weakness. Denies any headache, shortness of breath, chest pain,, pain, nausea or vomiting, diarrhea or constipation.  Objective:   Filed Vitals:   05/30/15 0423 05/30/15 0555 05/30/15 0758 05/30/15 0918  BP: 133/70 146/59 152/47 155/51  Pulse: 52 52 57 61  Temp: 98.2 F (36.8 C)  98.9 F (37.2 C)   TempSrc: Oral  Oral   Resp: 19 18 18 18   Weight:      SpO2: 100% 98%  95%    Intake/Output Summary (Last 24 hours) at 05/30/15 1321 Last data filed at 05/30/15 0931  Gross per 24 hour  Intake    240 ml  Output      0 ml  Net    240 ml   Filed Weights   05/29/15 2108  Weight: 82.555 kg (182 lb)    Exam  General: Well developed, well nourished, NAD, appears stated age  HEENT: NCAT, PERRLA, EOMI, Anicteic Sclera, mucous membranes moist.   Neck: Supple,  no JVD, no masses  Cardiovascular: S1 S2 auscultated, no rubs, murmurs or gallops. Regular rate and rhythm.  Respiratory: Clear to auscultation bilaterally  Abdomen: Soft, nontender, nondistended, + bowel sounds  Extremities: warm dry without cyanosis clubbing or  edema  Neuro: AAOx3, cranial nerves grossly intact. Strength 5/5 in patient's upper and lower extremities bilaterally  Psych: Normal affect and demeanor    Data Reviewed: I have personally reviewed following labs and imaging studies  CBC: No results for input(s): WBC, NEUTROABS, HGB, HCT, MCV, PLT in the last 168 hours. Basic Metabolic Panel:  Recent Labs Lab 05/29/15 1549 05/30/15 1036  NA 127* 125*  K 4.5 4.3  CL 94* 94*  CO2 21* 21*  GLUCOSE 106* 189*  BUN 16 12  CREATININE 0.81 0.79  CALCIUM 9.2 8.8*   GFR: CrCl cannot be calculated (Unknown ideal weight.). Liver Function Tests: No results for input(s): AST, ALT, ALKPHOS, BILITOT, PROT, ALBUMIN in the last 168 hours. No results for input(s): LIPASE, AMYLASE in the last 168 hours. No results for input(s): AMMONIA in the last 168 hours. Coagulation Profile: No results for input(s): INR, PROTIME in the last 168 hours. Cardiac Enzymes: No results for input(s): CKTOTAL, CKMB, CKMBINDEX, TROPONINI in the last 168 hours. BNP (last 3 results) No results for input(s): PROBNP in the last 8760 hours. HbA1C:  Recent Labs  05/29/15 1549  HGBA1C 7.2*   CBG:  Recent Labs Lab 05/29/15 1913 05/29/15 2124 05/30/15 0756 05/30/15 1149  GLUCAP 104* 136* 119* 144*   Lipid Profile:  Recent Labs  05/29/15 1549  CHOL 119  HDL 36*  LDLCALC 61  TRIG 108  CHOLHDL 3.3   Thyroid Function Tests: No results for input(s): TSH, T4TOTAL, FREET4, T3FREE, THYROIDAB in the last 72 hours. Anemia Panel: No results for input(s): VITAMINB12, FOLATE, FERRITIN, TIBC, IRON, RETICCTPCT in the last 72 hours. Urine analysis: No results found for: COLORURINE, APPEARANCEUR, LABSPEC, PHURINE, GLUCOSEU, HGBUR, BILIRUBINUR, KETONESUR, PROTEINUR, UROBILINOGEN, NITRITE, LEUKOCYTESUR Sepsis Labs: @LABRCNTIP (procalcitonin:4,lacticidven:4)  )No results found for this or any previous visit (from the past 240 hour(s)).    Radiology Studies: Ct  Angio Head W/cm &/or Wo Cm  05/29/2015  CLINICAL DATA:  Code stroke earlier today with right-sided weakness and dysphagia. Symptoms have since resolved. EXAM: CT ANGIOGRAPHY HEAD AND NECK TECHNIQUE: Multidetector CT imaging of the head and neck was performed using the standard protocol during bolus administration of intravenous contrast. Multiplanar CT image reconstructions and MIPs were obtained to evaluate the vascular anatomy. Carotid stenosis measurements (when applicable) are obtained utilizing NASCET criteria, using the distal internal carotid diameter as the denominator. CONTRAST:  50 mL Isovue 370 COMPARISON:  MRI brain and CT head without contrast from the same day. FINDINGS: CT HEAD Brain: Remote lacunar infarcts basal ganglia are again noted bilaterally. Extensive white matter disease is noted bilaterally. No acute cortical infarct, hemorrhage, or mass lesion is present. The ventricles are of normal size. No significant extra-axial fluid collection is present. Calvarium and skull base: Within normal limits. Atherosclerotic calcifications are seen within the cavernous internal carotid arteries bilaterally. Paranasal sinuses: Clear Orbits: Bilateral lens replacements are present. The globes and orbits are otherwise intact. CTA NECK Aortic arch: Atherosclerotic calcifications are present at the aortic arch. There is a moderate stenosis at the origin of the left common carotid artery. No other significant stenosis is present. Right carotid system: The right common carotid artery is tortuous. Calcifications are present in the distal right common carotid artery and at the bifurcation.  There is a focal ulceration at the right carotid bifurcation with a 3 mm pseudoaneurysm. The native lumen is narrowed to 2 mm. Focal tortuosity is present in the more distal cervical right ICA with some regularity but no other focal stenosis. Left carotid system: The left common carotid artery is within normal limits.  Calcifications are present at the carotid bifurcation. There is a focal ulceration without significant stenosis. The cervical left ICA is tortuous. Vertebral arteries:The vertebral arteries both originate from the subclavian arteries. There is no significant stenosis of either vertebral artery in the neck. Calcifications are present at dural margin on the left without significant stenosis. Skeleton: Grade 1 anterolisthesis at C3-4 measures 3 mm. Endplate change in uncovertebral disease is worse on the left at C4-5 and C5-6 on the right at C5-6 and C6-7. Vertebral body heights alignment are maintained. No focal lytic or blastic lesions are present. Other neck: The thyroid is mildly enlarged, without a dominant lesion. No significant cervical adenopathy is present. The a salivary glands are within normal limits. No focal mucosal or submucosal lesions are evident. The lung apices demonstrate centrilobular emphysematous change without focal nodule, mass, or airspace disease. CTA HEAD Anterior circulation: Dense calcifications are present within the cavernous internal carotid arteries bilaterally. There is no significant focal stenosis. The ICA termini are intact. The A1 and M1 segments are within normal limits. The right A1 segment is dominant. The anterior communicating artery is patent. MCA bifurcations are intact bilaterally. MCA and ACA branch vessels are within normal limits. Posterior circulation: Left vertebral artery is dominant vessel. The PICA origins are visualized and normal bilaterally. The vertebral basilar junction is intact. A fetal type left posterior cerebral artery is present. A smaller right posterior communicating artery is present with primary feeding to the right PCA from a P1 segment. The PCA branch vessels are intact. Venous sinuses: The dural sinuses are patent. The right transverse sinus is dominant. The straight sinus and deep cerebral veins are intact. The cortical veins are within normal  limits. Anatomic variants: Fetal type left posterior cerebral artery. Prominent right posterior communicating artery. Delayed phase: The postcontrast images demonstrate no pathologic enhancement. IMPRESSION: 1. Moderate stenosis at the origin of the left common carotid artery. 2. Approximately 50% stenosis at the origin of the right internal carotid artery with atherosclerotic disease and a focal ulceration. A 3 mm pseudoaneurysm is present. 3. Atherosclerotic calcifications and ulceration at the proximal left internal carotid artery without a significant stenosis. 4. Atherosclerotic disease within the cavernous internal carotid arteries bilaterally without significant stenosis. Electronically Signed   By: San Morelle M.D.   On: 05/29/2015 19:50   Ct Angio Neck W/cm &/or Wo/cm  05/29/2015  CLINICAL DATA:  Code stroke earlier today with right-sided weakness and dysphagia. Symptoms have since resolved. EXAM: CT ANGIOGRAPHY HEAD AND NECK TECHNIQUE: Multidetector CT imaging of the head and neck was performed using the standard protocol during bolus administration of intravenous contrast. Multiplanar CT image reconstructions and MIPs were obtained to evaluate the vascular anatomy. Carotid stenosis measurements (when applicable) are obtained utilizing NASCET criteria, using the distal internal carotid diameter as the denominator. CONTRAST:  50 mL Isovue 370 COMPARISON:  MRI brain and CT head without contrast from the same day. FINDINGS: CT HEAD Brain: Remote lacunar infarcts basal ganglia are again noted bilaterally. Extensive white matter disease is noted bilaterally. No acute cortical infarct, hemorrhage, or mass lesion is present. The ventricles are of normal size. No significant extra-axial fluid collection is present.  Calvarium and skull base: Within normal limits. Atherosclerotic calcifications are seen within the cavernous internal carotid arteries bilaterally. Paranasal sinuses: Clear Orbits: Bilateral  lens replacements are present. The globes and orbits are otherwise intact. CTA NECK Aortic arch: Atherosclerotic calcifications are present at the aortic arch. There is a moderate stenosis at the origin of the left common carotid artery. No other significant stenosis is present. Right carotid system: The right common carotid artery is tortuous. Calcifications are present in the distal right common carotid artery and at the bifurcation. There is a focal ulceration at the right carotid bifurcation with a 3 mm pseudoaneurysm. The native lumen is narrowed to 2 mm. Focal tortuosity is present in the more distal cervical right ICA with some regularity but no other focal stenosis. Left carotid system: The left common carotid artery is within normal limits. Calcifications are present at the carotid bifurcation. There is a focal ulceration without significant stenosis. The cervical left ICA is tortuous. Vertebral arteries:The vertebral arteries both originate from the subclavian arteries. There is no significant stenosis of either vertebral artery in the neck. Calcifications are present at dural margin on the left without significant stenosis. Skeleton: Grade 1 anterolisthesis at C3-4 measures 3 mm. Endplate change in uncovertebral disease is worse on the left at C4-5 and C5-6 on the right at C5-6 and C6-7. Vertebral body heights alignment are maintained. No focal lytic or blastic lesions are present. Other neck: The thyroid is mildly enlarged, without a dominant lesion. No significant cervical adenopathy is present. The a salivary glands are within normal limits. No focal mucosal or submucosal lesions are evident. The lung apices demonstrate centrilobular emphysematous change without focal nodule, mass, or airspace disease. CTA HEAD Anterior circulation: Dense calcifications are present within the cavernous internal carotid arteries bilaterally. There is no significant focal stenosis. The ICA termini are intact. The A1 and  M1 segments are within normal limits. The right A1 segment is dominant. The anterior communicating artery is patent. MCA bifurcations are intact bilaterally. MCA and ACA branch vessels are within normal limits. Posterior circulation: Left vertebral artery is dominant vessel. The PICA origins are visualized and normal bilaterally. The vertebral basilar junction is intact. A fetal type left posterior cerebral artery is present. A smaller right posterior communicating artery is present with primary feeding to the right PCA from a P1 segment. The PCA branch vessels are intact. Venous sinuses: The dural sinuses are patent. The right transverse sinus is dominant. The straight sinus and deep cerebral veins are intact. The cortical veins are within normal limits. Anatomic variants: Fetal type left posterior cerebral artery. Prominent right posterior communicating artery. Delayed phase: The postcontrast images demonstrate no pathologic enhancement. IMPRESSION: 1. Moderate stenosis at the origin of the left common carotid artery. 2. Approximately 50% stenosis at the origin of the right internal carotid artery with atherosclerotic disease and a focal ulceration. A 3 mm pseudoaneurysm is present. 3. Atherosclerotic calcifications and ulceration at the proximal left internal carotid artery without a significant stenosis. 4. Atherosclerotic disease within the cavernous internal carotid arteries bilaterally without significant stenosis. Electronically Signed   By: Marin Roberts M.D.   On: 05/29/2015 19:50   Mr Brain Wo Contrast  05/29/2015  CLINICAL DATA:  New onset slurred speech and difficulty using her right hand. She denies weakness. Symptoms were resolved at the time of presentation. EXAM: MRI HEAD WITHOUT CONTRAST TECHNIQUE: Multiplanar, multiecho pulse sequences of the brain and surrounding structures were obtained without intravenous contrast. COMPARISON:  CT head  without contrast 05/29/2015 FINDINGS: The  diffusion-weighted images demonstrate no evidence for acute or subacute infarction. No acute hemorrhage or mass lesion is present. Advanced confluent periventricular and subcortical white matter changes are present bilaterally. There are remote lacunar infarcts of the basal ganglia bilaterally, left thalamus, in the right lateral pons. A remote lacunar infarct is present in the posterior left cerebellum. White matter changes extend into the brainstem. No significant extraaxial fluid collection is present. The internal auditory canals are within normal limits bilaterally. Flow is present in the major intracranial arteries. Bilateral lens replacements are present. The globes and orbits are intact. The paranasal sinuses and the mastoid air cells are clear. IMPRESSION: 1. No acute intracranial abnormality to explain the patient's acute symptoms. 2. Advanced diffuse white matter disease with multiple remote lacunar infarcts compatible with chronic microvascular ischemic change. 3. Moderate generalized atrophy. Electronically Signed   By: San Morelle M.D.   On: 05/29/2015 17:55     Scheduled Meds: .  stroke: mapping our early stages of recovery book   Does not apply Once  . clopidogrel  75 mg Oral q morning - 10a  . insulin aspart  0-5 Units Subcutaneous QHS  . insulin aspart  0-9 Units Subcutaneous TID WC  . pantoprazole  20 mg Oral QAC breakfast  . rosuvastatin  10 mg Oral QODAY   Continuous Infusions:      Time Spent in minutes   30 minutes  Lynnwood Beckford D.O. on 05/30/2015 at 1:21 PM  Between 7am to 7pm - Pager - [phone]  After 7pm go to [email or web address] - password [email or web address]  And look for the night coverage person covering for me after hours  [facility] Office  [phone]

## 2015-05-30 NOTE — Progress Notes (Signed)
PT Cancellation Note  Patient Details Name: Marilyn Robinson MRN: [id number] DOB: 13-Jul-1933   Cancelled Treatment:    Reason Eval/Treat Not Completed: PT screened, no needs identified, will sign off. The patient and family member both report pt is at baseline.   [name] 05/30/2015, 3:30 PM [place]

## 2015-05-30 NOTE — Progress Notes (Signed)
Pt admitted from ED per stretcher accompanied by a nurse tech and pt family, self introduced to pt, ID bracelet checked, fall prevention plan discuss with pt and family,admission package given, pt oriented to the unit and equipment,treatment started as prescribed, call light and phone within reach and pt able to demonstrate how to use them, will continue to monitor

## 2015-05-30 NOTE — Care Management Obs Status (Signed)
MEDICARE OBSERVATION STATUS NOTIFICATION   Patient Details  Name: Marilyn Robinson MRN: [id number] Date of Birth: [birth year]   Medicare Observation Status Notification Given:  Yes    Sharin Mons, RN 05/30/2015, 11:55 AM

## 2015-05-31 DIAGNOSIS — E871 Hypo-osmolality and hyponatremia: Secondary | ICD-10-CM | POA: Diagnosis not present

## 2015-05-31 DIAGNOSIS — E119 Type 2 diabetes mellitus without complications: Secondary | ICD-10-CM | POA: Diagnosis not present

## 2015-05-31 DIAGNOSIS — I10 Essential (primary) hypertension: Secondary | ICD-10-CM | POA: Diagnosis not present

## 2015-05-31 DIAGNOSIS — R29818 Other symptoms and signs involving the nervous system: Secondary | ICD-10-CM | POA: Diagnosis not present

## 2015-05-31 DIAGNOSIS — K219 Gastro-esophageal reflux disease without esophagitis: Secondary | ICD-10-CM | POA: Diagnosis not present

## 2015-05-31 DIAGNOSIS — I639 Cerebral infarction, unspecified: Secondary | ICD-10-CM | POA: Diagnosis not present

## 2015-05-31 LAB — BASIC METABOLIC PANEL
Anion gap: 8 (ref 5–15)
BUN: 8 mg/dL (ref 6–20)
CHLORIDE: 99 mmol/L — AB (ref 101–111)
CO2: 26 mmol/L (ref 22–32)
CREATININE: 0.82 mg/dL (ref 0.44–1.00)
Calcium: 9.4 mg/dL (ref 8.9–10.3)
GFR calc Af Amer: >60 mL/min (ref >60)
Glucose, Bld: 125 mg/dL — ABNORMAL HIGH (ref 65–99)
Potassium: 5 mmol/L (ref 3.5–5.1)
SODIUM: 133 mmol/L — AB (ref 135–145)

## 2015-05-31 LAB — GLUCOSE, CAPILLARY
GLUCOSE-CAPILLARY: 132 mg/dL — AB (ref 65–99)
GLUCOSE-CAPILLARY: 164 mg/dL — AB (ref 65–99)
Glucose-Capillary: 127 mg/dL — ABNORMAL HIGH (ref 65–99)

## 2015-05-31 LAB — SODIUM, URINE, RANDOM: Sodium, Ur: 58 mmol/L

## 2015-05-31 LAB — OSMOLALITY, URINE: OSMOLALITY UR: 458 mosm/kg (ref 300–900)

## 2015-05-31 MED ORDER — PANTOPRAZOLE SODIUM 20 MG PO TBEC: 20.0000 mg | DELAYED_RELEASE_TABLET | Freq: Every day | ORAL | Status: DC

## 2015-05-31 MED ORDER — GLYCERIN (LAXATIVE) 2.1 G RE SUPP
1.0000 | Freq: Once | RECTAL | Status: AC
  Administered 2015-05-31: 1 via RECTAL
  Filled 2015-05-31: qty 1

## 2015-05-31 MED ORDER — ASPIRIN 81 MG PO TBEC: 81.0000 mg | DELAYED_RELEASE_TABLET | Freq: Every day | ORAL | Status: DC

## 2015-05-31 NOTE — Progress Notes (Signed)
OT Cancellation Note  Patient Details Name: MONTEEN MELIS MRN: [id number] DOB: 09/01/1933   Cancelled Treatment:    Reason Eval/Treat Not Completed: OT screened, no needs identified, will sign off. Pt ambulating in room upon therapist arrival. Balance and RUE ROM/strength appear to be at baseline. Pt in agreement with no OT needs identified. Will sign off. Thank you for the referral.   Clover Mealy OTR/L Pager: [phone]  05/31/2015, 10:33 AM

## 2015-05-31 NOTE — Progress Notes (Signed)
STROKE TEAM PROGRESS NOTE   HISTORY OF PRESENT ILLNESS Marilyn Robinson is an 82 y.o. female who was drinking her coffee at 10:45 05/29/2015 (LKW) and noted she was unable to control her right hand and then noted she had slurred speech. She called her daughter who called EMS and she was brought to [facility]. She was then transferred to [facility] ED. Her symptoms resolved in about one hour and she is asymptomatic. She has had similar symptoms in 2010 but on the right arm and left leg. This was attributed to hyponatremia. She does take Plavix daily. Patient was not administered IV t-PA secondary to resolved symptoms. She was admitted for further evaluation and treatment.   SUBJECTIVE (INTERVAL HISTORY) No family is at the bedside.  Overall she feels her condition is completely resolved. Overnight no acute events. Na today 133. EEG unremarkable.   OBJECTIVE Temp:  [97.5 F (36.4 C)-98.1 F (36.7 C)] 98 F (36.7 C) (05/19 0538) Pulse Rate:  [58-88] 58 (05/19 0538) Cardiac Rhythm:  [-] Normal sinus rhythm;Heart block (05/19 0703) Resp:  [16-18] 18 (05/19 1006) BP: (152-172)/(47-80) 160/80 mmHg (05/19 1006) SpO2:  [95 %-99 %] 95 % (05/19 1006)  CBC: No results for input(s): WBC, NEUTROABS, HGB, HCT, MCV, PLT in the last 168 hours.  Basic Metabolic Panel:   Recent Labs Lab 05/30/15 1036 05/31/15 0718  NA 125* 133*  K 4.3 5.0  CL 94* 99*  CO2 21* 26  GLUCOSE 189* 125*  BUN 12 8  CREATININE 0.79 0.82  CALCIUM 8.8* 9.4    Lipid Panel:     Component Value Date/Time   CHOL 119 05/29/2015 1549   TRIG 108 05/29/2015 1549   HDL 36* 05/29/2015 1549   CHOLHDL 3.3 05/29/2015 1549   VLDL 22 05/29/2015 1549   LDLCALC 61 05/29/2015 1549   HgbA1c:  Lab Results  Component Value Date   HGBA1C 7.2* 05/29/2015   Urine Drug Screen: No results found for: LABOPIA, COCAINSCRNUR, LABBENZ, AMPHETMU, THCU, LABBARB    IMAGING I have personally reviewed the radiological images below and  agree with the radiology interpretations.  Ct Angio Head & Neck W/cm &/or Wo/cm 05/29/2015  1. Moderate stenosis at the origin of the left common carotid artery. 2. Approximately 50% stenosis at the origin of the right internal carotid artery with atherosclerotic disease and a focal ulceration. A 3 mm pseudoaneurysm is present. 3. Atherosclerotic calcifications and ulceration at the proximal left internal carotid artery without a significant stenosis. 4. Atherosclerotic disease within the cavernous internal carotid arteries bilaterally without significant stenosis.  Mr Brain Wo Contrast 05/29/2015  1. No acute intracranial abnormality to explain the patient's acute symptoms. 2. Advanced diffuse white matter disease with multiple remote lacunar infarcts compatible with chronic microvascular ischemic change. 3. Moderate generalized atrophy.   TTE - - Left ventricle: The cavity size was normal. Wall thickness was  increased in a pattern of moderate LVH. There was moderate  concentric hypertrophy. Systolic function was normal. The  estimated ejection fraction was in the range of 55% to 60%. Wall  motion was normal; there were no regional wall motion  abnormalities. - Mitral valve: Mildly calcified annulus. There was mild  regurgitation. - Left atrium: The atrium was mildly dilated. Impressions: - No cardiac source of emboli was indentified.  EEG Unremarkable awake and drowsy routine inpatient EEG. Clinical correlation is recommended .   PHYSICAL EXAM  Temp:  [97.5 F (36.4 C)-98.1 F (36.7 C)] 98 F (36.7 C) (05/19  [id number]) Pulse Rate:  [58-88] 58 (05/19 0538) Resp:  [16-18] 18 (05/19 1006) BP: (152-172)/(47-80) 160/80 mmHg (05/19 1006) SpO2:  [95 %-99 %] 95 % (05/19 1006)  General - Well nourished, well developed, in no apparent distress.  Ophthalmologic - Fundi not visualized due to eye movement.  Cardiovascular - Regular rate and rhythm.  Mental Status -  Level of arousal and  orientation to time, place, and person were intact. Language including expression, naming, repetition, comprehension was assessed and found intact.  Cranial Nerves II - XII - II - Visual field intact OU. III, IV, VI - Extraocular movements intact. V - Facial sensation intact bilaterally. VII - Facial movement intact bilaterally. VIII - Hearing & vestibular intact bilaterally. X - Palate elevates symmetrically. XI - Chin turning & shoulder shrug intact bilaterally. XII - Tongue protrusion intact.  Motor Strength - The patient's strength was normal in all extremities and pronator drift was absent.  Bulk was normal and fasciculations were absent.   Motor Tone - Muscle tone was assessed at the neck and appendages and was normal.  Reflexes - The patient's reflexes were 1+ in all extremities and she had no pathological reflexes.  Sensory - Light touch, temperature/pinprick were assessed and were symmetrical.    Coordination - The patient had normal movements in the hands and feet with no ataxia or dysmetria.  Tremor was absent.  Gait and Station - not tested due to safety concerns.   ASSESSMENT/PLAN [name] is a 82 y.o. female with history of HTN, DM, TIA, GERD, breast cancer presenting with transient slurred speech and right hand clumsiness. She did not receive IV t-PA due to deficits resolved.   Left brain cortical TIA - with right hand weakness and aphasia. Pt has similar episode in 2010. Recommend 30 day cardiac event monitoring.  Resultant  Deficit resolved  MRI  No acute stroke, but microbleeds left BG  CTA H&N  R ICA 50% w/ ulceration , L ICA ulceration without stenosis  2D Echo  EF 55-60%  Recommend 30 day cardiac event monitoring as outpt, pt wish to do it with her cardiologist in [place].  LDL 61  HgbA1c 7.2  SCDs for VTE prophylaxis.  Diet Heart Room service appropriate?: Yes; Fluid consistency:: Thin  clopidogrel 75 mg daily prior to admission, now  on clopidogrel 75 mg daily. Due to intracranial stenosis, we recommend DAPT. However, pt seems not able to tolerate ASA in the past, but she is willing to try again ASA EC 81mg  for 3 months together with plavix. After 3 months, plavix alone.  Patient counseled to be compliant with her antithrombotic medications  Ongoing aggressive stroke risk factor management  Therapy recommendations:  pending  Disposition:  Pending  Hyponatremia  Pt stated that her first episode in 2010 was related with low Na  This admission, Na 125, today Na 133  Pt Na 10/2012 was 132 and 01/2015 was 130  EEG no seizure   Hypertension  Stable  Hyperlipidemia  Home meds:  crestor 10, resumed in hospital  LDL 61, at goal < 70  Continue statin at discharge  Diabetes  HgbA1c 7.2 , at goal < 7.0  Controlled  SSI   CBG monitoring  Other Stroke Risk Factors  Advanced age  Former Cigarette smoker  Hx stroke/TIA  2010 word finding difficulty with generalized weakness  Other Active Problems  Hyponatremia  GERD  Hospital day # 2  Neurology will sign off. Please call with questions. Pt  will follow up with Dr. Erlinda Hong at [facility] in about 2 months. Thanks for the consult.   Rosalin Hawking, MD PhD Stroke Neurology 05/31/2015 1:54 PM      To contact Stroke Continuity provider, please refer to [email or web address] After hours, contact General Neurology

## 2015-05-31 NOTE — Progress Notes (Signed)
Marilyn Robinson to be D/C'd Home per MD order.  Discussed with the patient and all questions fully answered.  VSS, Skin clean, dry and intact without evidence of skin break down, no evidence of skin tears noted. IV catheter discontinued intact. Site without signs and symptoms of complications. Dressing and pressure applied.  An After Visit Summary was printed and given to the patient. Patient received prescription.  D/c education completed with patient/family including follow up instructions, medication list, d/c activities limitations if indicated, with other d/c instructions as indicated by MD - patient able to verbalize understanding, all questions fully answered.   Patient instructed to return to ED, call 911, or call MD for any changes in condition.   Patient to be escorted via [place], and D/C home via private auto.  L'ESPERANCE, [name] 05/31/2015 1:10 PM

## 2015-05-31 NOTE — Discharge Summary (Signed)
Physician Discharge Summary  Marilyn Robinson [id number] DOB: [birth year] DOA: 05/29/2015  PCP: No primary care provider on file.  Admit date: 05/29/2015 Discharge date: 05/31/2015  Time spent: 45 minutes  Recommendations for Outpatient Follow-up:  Patient will be discharged to home.  Patient will need to follow up with primary care provider within one week of discharge. Patient follow need to follow up with her cardiologist, Dr. Shirlee More, in [place] for a 30 day event monitor. Patient should continue medications as prescribed.  Patient should follow a heart healthy/carb modified diet.   Discharge Diagnoses:  Principal Problem:   Stroke-like episode ([place]) Active Problems:   Hyponatremia   Hypertension   Diabetes mellitus without complication (HCC)   GERD (gastroesophageal reflux disease)   Discharge Condition: Stable   Diet recommendation: heart healthy/carb modified  Filed Weights   05/29/15 2108  Weight: 82.555 kg (182 lb)    History of present illness:  On 05/29/2015 by Ms. Dyanne Carrel, NP Marilyn Robinson is a delightful 82 y.o. female with past medical history that includes hyponatremia, hypertension, diabetes, GERD since emergency department from [place] [facility] [place] with the chief complaint of slurred speech. Initial evaluation concerning for CVA so patient was transported to the [facility] emergency department.  Information is obtained from the patient and the chart. She states she was in her usual state of health until this morning around 1045 when she noted difficulty maneuvering her right hand. She states it was week she had difficulty holding her coffee cup or using the mouse on her computer. Associated symptoms include slurred speech and generalized weakness. She denied any weakness of her right arm shoulder right leg right foot. She denied any gait disturbances. She denied headache visual disturbances numbness or tingling of extremities. She denies chest pain  palpitation headache syncope or near-syncope. She denies abdominal pain nausea vomiting dysuria hematuria frequency or urgency. She does report some intermittent constipation. States she called her daughter who called EMS and the patient was [facility] to [facility]. The time of admission symptoms are resolved. She reports having had a similar episode 7 years ago and right arm and left leg that was attributed to low sodium. Reports this episode lasted about an hour.  Emergency department she's afebrile hemodynamically stable and not hypoxic. Volume [place] chest CT of the head that showed no acute abnormality EKG without acute changes with bradycardia. Lab work revealed a hyponatremia  Hospital Course:  Slurred speech/right-hand weakness, suspect TIA versus CVA -CT head showed no acute abnormality -MRI brain: No acute intracranial abnormality, advanced diffuse white matter disease with multiple remote lacunar infarcts -CTA neck: moderate stenosis at the origin of the left CCA -Neurology consulted and appreciated -Echocardiogram EF 55-60%, no cardiac source of emboli -Continue Plavix, statin -LDL 61 -Hemoglobin A1c 7.2 -Follow up with Dr. Erlinda Hong in 2 months -Patient is to continue plavix and aspirin (she has had an intolerance to aspirin in the past) -Patient has been under a lot of stress lately (lost 2 of her children to pancreatic cancer and her husband) -PT and OT evaluated patient and she does not need further therapy -EEG unremarkable  Hyponatremia -Sodium 133  Diabetes mellitus, type II -Metformin held -Placed on insulin sliding scale CBG monitoring -Hemoglobin A1c 7.2  Essential hypertension -Home medications held, allowing for permissive hypertension  GERD -Continue PPI  Code Status: Limited: DNI/DNR, only BiPAP  Consultants Neurology  Procedures  Echocardiogram EEG  Discharge Exam: Filed Vitals:   05/31/15 0538 05/31/15 1006  BP: 154/53 160/80  Pulse: 58     Temp: 98 F (36.7 C)   Resp: 16 18    Exam  General: Well developed, well nourished, NAD, appears stated age  HEENT: NCAT, mucous membranes moist.   Cardiovascular: S1 S2 auscultated, RRR  Respiratory: Clear to auscultation bilaterally  Abdomen: Soft, nontender, nondistended, + bowel sounds  Extremities: warm dry without cyanosis clubbing or edema  Neuro: AAOx3, nonfocal   Psych: Normal affect and demeanor, pleasant  Discharge Instructions      Discharge Instructions    Discharge instructions    Complete by:  As directed   Patient will be discharged to home.  Patient will need to follow up with primary care provider within one week of discharge. Patient follow need to follow up with her cardiologist, Dr. Shirlee More, in [place] for a 30 day event monitor. Patient should continue medications as prescribed.  Patient should follow a heart healthy/carb modified diet.            Medication List    STOP taking these medications        CELEBREX 200 MG capsule  Generic drug:  celecoxib      TAKE these medications        acetaminophen 500 MG tablet  Commonly known as:  TYLENOL  Take 500 mg by mouth every 6 (six) hours as needed for mild pain.     aspirin 81 MG EC tablet  Take 1 tablet (81 mg total) by mouth daily.     carvedilol 25 MG tablet  Commonly known as:  COREG  Take 37.5 mg by mouth 2 (two) times daily.     clopidogrel 75 MG tablet  Commonly known as:  PLAVIX  Take 75 mg by mouth every morning.     CRESTOR 10 MG tablet  Generic drug:  rosuvastatin  Take 10 mg by mouth every other day.     cyanocobalamin 1000 MCG/ML injection  Commonly known as:  (VITAMIN B-12)  Inject 1 mL into the muscle every 30 (thirty) days.     fluconazole 100 MG tablet  Commonly known as:  DIFLUCAN  Take 100 mg by mouth daily.     furosemide 20 MG tablet  Commonly known as:  LASIX  Take 20 mg by mouth daily as needed for fluid or edema.     hydrALAZINE 10 MG tablet   Commonly known as:  APRESOLINE  Take 5 mg by mouth 2 (two) times daily.     losartan 50 MG tablet  Commonly known as:  COZAAR  Take 50 mg by mouth 2 (two) times daily.     metFORMIN 500 MG 24 hr tablet  Commonly known as:  GLUCOPHAGE-XR  Take 500-1,000 mg by mouth 2 (two) times daily. 500 mg in the morning and 1,000 at night     pantoprazole 20 MG tablet  Commonly known as:  PROTONIX  Take 1 tablet (20 mg total) by mouth daily before breakfast.       Allergies  Allergen Reactions  . Antihistamines, Diphenhydramine-Type Other (See Comments)    Causes headaches  . Aspirin Nausea And Vomiting  . Lipitor [Atorvastatin] Other (See Comments)    Caused muscle weakness, but can tolerate a low dose of Crestor  . Penicillins     Has patient had a PCN reaction causing immediate rash, facial/tongue/throat swelling, SOB or lightheadedness with hypotension: Yes Has patient had a PCN reaction causing severe rash involving [id number] membranes or skin necrosis: No  Has patient had a PCN reaction that required hospitalization: No Has patient had a PCN reaction occurring within the last 10 years: No If all of the above answers are "NO", then may proceed with Cephalosporin use.   . Allopurinol Rash   Follow-up Information    Follow up with Primary care provider. Schedule an appointment as soon as possible for a visit in 1 week.   Why:  Hospital follow up      Follow up with Xu,Jindong, MD. Schedule an appointment as soon as possible for a visit in 2 months.   Specialty:  Neurology   Why:  Hospital follow up, stroke clinic   Contact information:   [address] [place] [address] [phone]       Follow up with Shirlee More, MD. Schedule an appointment as soon as possible for a visit in 1 week.   Specialty:  Cardiology   Why:  To obtain 30 day event monitor   Contact information:   [address] [place] [address] [phone]        The results of  significant diagnostics from this hospitalization (including imaging, microbiology, ancillary and laboratory) are listed below for reference.    Significant Diagnostic Studies: Ct Angio Head W/cm &/or Wo Cm  05/29/2015  CLINICAL DATA:  Code stroke earlier today with right-sided weakness and dysphagia. Symptoms have since resolved. EXAM: CT ANGIOGRAPHY HEAD AND NECK TECHNIQUE: Multidetector CT imaging of the head and neck was performed using the standard protocol during bolus administration of intravenous contrast. Multiplanar CT image reconstructions and MIPs were obtained to evaluate the vascular anatomy. Carotid stenosis measurements (when applicable) are obtained utilizing NASCET criteria, using the distal internal carotid diameter as the denominator. CONTRAST:  50 mL Isovue 370 COMPARISON:  MRI brain and CT head without contrast from the same day. FINDINGS: CT HEAD Brain: Remote lacunar infarcts basal ganglia are again noted bilaterally. Extensive white matter disease is noted bilaterally. No acute cortical infarct, hemorrhage, or mass lesion is present. The ventricles are of normal size. No significant extra-axial fluid collection is present. Calvarium and skull base: Within normal limits. Atherosclerotic calcifications are seen within the cavernous internal carotid arteries bilaterally. Paranasal sinuses: Clear Orbits: Bilateral lens replacements are present. The globes and orbits are otherwise intact. CTA NECK Aortic arch: Atherosclerotic calcifications are present at the aortic arch. There is a moderate stenosis at the origin of the left common carotid artery. No other significant stenosis is present. Right carotid system: The right common carotid artery is tortuous. Calcifications are present in the distal right common carotid artery and at the bifurcation. There is a focal ulceration at the right carotid bifurcation with a 3 mm pseudoaneurysm. The native lumen is narrowed to 2 mm. Focal tortuosity is  present in the more distal cervical right ICA with some regularity but no other focal stenosis. Left carotid system: The left common carotid artery is within normal limits. Calcifications are present at the carotid bifurcation. There is a focal ulceration without significant stenosis. The cervical left ICA is tortuous. Vertebral arteries:The vertebral arteries both originate from the subclavian arteries. There is no significant stenosis of either vertebral artery in the neck. Calcifications are present at dural margin on the left without significant stenosis. Skeleton: Grade 1 anterolisthesis at C3-4 measures 3 mm. Endplate change in uncovertebral disease is worse on the left at C4-5 and C5-6 on the right at C5-6 and C6-7. Vertebral body heights alignment are maintained. No focal lytic or blastic  lesions are present. Other neck: The thyroid is mildly enlarged, without a dominant lesion. No significant cervical adenopathy is present. The a salivary glands are within normal limits. No focal mucosal or submucosal lesions are evident. The lung apices demonstrate centrilobular emphysematous change without focal nodule, mass, or airspace disease. CTA HEAD Anterior circulation: Dense calcifications are present within the cavernous internal carotid arteries bilaterally. There is no significant focal stenosis. The ICA termini are intact. The A1 and M1 segments are within normal limits. The right A1 segment is dominant. The anterior communicating artery is patent. MCA bifurcations are intact bilaterally. MCA and ACA branch vessels are within normal limits. Posterior circulation: Left vertebral artery is dominant vessel. The PICA origins are visualized and normal bilaterally. The vertebral basilar junction is intact. A fetal type left posterior cerebral artery is present. A smaller right posterior communicating artery is present with primary feeding to the right PCA from a P1 segment. The PCA branch vessels are intact. Venous  sinuses: The dural sinuses are patent. The right transverse sinus is dominant. The straight sinus and deep cerebral veins are intact. The cortical veins are within normal limits. Anatomic variants: Fetal type left posterior cerebral artery. Prominent right posterior communicating artery. Delayed phase: The postcontrast images demonstrate no pathologic enhancement. IMPRESSION: 1. Moderate stenosis at the origin of the left common carotid artery. 2. Approximately 50% stenosis at the origin of the right internal carotid artery with atherosclerotic disease and a focal ulceration. A 3 mm pseudoaneurysm is present. 3. Atherosclerotic calcifications and ulceration at the proximal left internal carotid artery without a significant stenosis. 4. Atherosclerotic disease within the cavernous internal carotid arteries bilaterally without significant stenosis. Electronically Signed   By: San Morelle M.D.   On: 05/29/2015 19:50   Ct Angio Neck W/cm &/or Wo/cm  05/29/2015  CLINICAL DATA:  Code stroke earlier today with right-sided weakness and dysphagia. Symptoms have since resolved. EXAM: CT ANGIOGRAPHY HEAD AND NECK TECHNIQUE: Multidetector CT imaging of the head and neck was performed using the standard protocol during bolus administration of intravenous contrast. Multiplanar CT image reconstructions and MIPs were obtained to evaluate the vascular anatomy. Carotid stenosis measurements (when applicable) are obtained utilizing NASCET criteria, using the distal internal carotid diameter as the denominator. CONTRAST:  50 mL Isovue 370 COMPARISON:  MRI brain and CT head without contrast from the same day. FINDINGS: CT HEAD Brain: Remote lacunar infarcts basal ganglia are again noted bilaterally. Extensive white matter disease is noted bilaterally. No acute cortical infarct, hemorrhage, or mass lesion is present. The ventricles are of normal size. No significant extra-axial fluid collection is present. Calvarium and skull  base: Within normal limits. Atherosclerotic calcifications are seen within the cavernous internal carotid arteries bilaterally. Paranasal sinuses: Clear Orbits: Bilateral lens replacements are present. The globes and orbits are otherwise intact. CTA NECK Aortic arch: Atherosclerotic calcifications are present at the aortic arch. There is a moderate stenosis at the origin of the left common carotid artery. No other significant stenosis is present. Right carotid system: The right common carotid artery is tortuous. Calcifications are present in the distal right common carotid artery and at the bifurcation. There is a focal ulceration at the right carotid bifurcation with a 3 mm pseudoaneurysm. The native lumen is narrowed to 2 mm. Focal tortuosity is present in the more distal cervical right ICA with some regularity but no other focal stenosis. Left carotid system: The left common carotid artery is within normal limits. Calcifications are present at the carotid  bifurcation. There is a focal ulceration without significant stenosis. The cervical left ICA is tortuous. Vertebral arteries:The vertebral arteries both originate from the subclavian arteries. There is no significant stenosis of either vertebral artery in the neck. Calcifications are present at dural margin on the left without significant stenosis. Skeleton: Grade 1 anterolisthesis at C3-4 measures 3 mm. Endplate change in uncovertebral disease is worse on the left at C4-5 and C5-6 on the right at C5-6 and C6-7. Vertebral body heights alignment are maintained. No focal lytic or blastic lesions are present. Other neck: The thyroid is mildly enlarged, without a dominant lesion. No significant cervical adenopathy is present. The a salivary glands are within normal limits. No focal mucosal or submucosal lesions are evident. The lung apices demonstrate centrilobular emphysematous change without focal nodule, mass, or airspace disease. CTA HEAD Anterior circulation:  Dense calcifications are present within the cavernous internal carotid arteries bilaterally. There is no significant focal stenosis. The ICA termini are intact. The A1 and M1 segments are within normal limits. The right A1 segment is dominant. The anterior communicating artery is patent. MCA bifurcations are intact bilaterally. MCA and ACA branch vessels are within normal limits. Posterior circulation: Left vertebral artery is dominant vessel. The PICA origins are visualized and normal bilaterally. The vertebral basilar junction is intact. A fetal type left posterior cerebral artery is present. A smaller right posterior communicating artery is present with primary feeding to the right PCA from a P1 segment. The PCA branch vessels are intact. Venous sinuses: The dural sinuses are patent. The right transverse sinus is dominant. The straight sinus and deep cerebral veins are intact. The cortical veins are within normal limits. Anatomic variants: Fetal type left posterior cerebral artery. Prominent right posterior communicating artery. Delayed phase: The postcontrast images demonstrate no pathologic enhancement. IMPRESSION: 1. Moderate stenosis at the origin of the left common carotid artery. 2. Approximately 50% stenosis at the origin of the right internal carotid artery with atherosclerotic disease and a focal ulceration. A 3 mm pseudoaneurysm is present. 3. Atherosclerotic calcifications and ulceration at the proximal left internal carotid artery without a significant stenosis. 4. Atherosclerotic disease within the cavernous internal carotid arteries bilaterally without significant stenosis. Electronically Signed   By: San Morelle M.D.   On: 05/29/2015 19:50   Mr Brain Wo Contrast  05/29/2015  CLINICAL DATA:  New onset slurred speech and difficulty using her right hand. She denies weakness. Symptoms were resolved at the time of presentation. EXAM: MRI HEAD WITHOUT CONTRAST TECHNIQUE: Multiplanar,  multiecho pulse sequences of the brain and surrounding structures were obtained without intravenous contrast. COMPARISON:  CT head without contrast 05/29/2015 FINDINGS: The diffusion-weighted images demonstrate no evidence for acute or subacute infarction. No acute hemorrhage or mass lesion is present. Advanced confluent periventricular and subcortical white matter changes are present bilaterally. There are remote lacunar infarcts of the basal ganglia bilaterally, left thalamus, in the right lateral pons. A remote lacunar infarct is present in the posterior left cerebellum. White matter changes extend into the brainstem. No significant extraaxial fluid collection is present. The internal auditory canals are within normal limits bilaterally. Flow is present in the major intracranial arteries. Bilateral lens replacements are present. The globes and orbits are intact. The paranasal sinuses and the mastoid air cells are clear. IMPRESSION: 1. No acute intracranial abnormality to explain the patient's acute symptoms. 2. Advanced diffuse white matter disease with multiple remote lacunar infarcts compatible with chronic microvascular ischemic change. 3. Moderate generalized atrophy. Electronically Signed  By: San Morelle M.D.   On: 05/29/2015 17:55    Microbiology: No results found for this or any previous visit (from the past 240 hour(s)).   Labs: Basic Metabolic Panel:  Recent Labs Lab 05/29/15 1549 05/30/15 1036 05/31/15 0718  NA 127* 125* 133*  K 4.5 4.3 5.0  CL 94* 94* 99*  CO2 21* 21* 26  GLUCOSE 106* 189* 125*  BUN 16 12 8   CREATININE 0.81 0.79 0.82  CALCIUM 9.2 8.8* 9.4   Liver Function Tests: No results for input(s): AST, ALT, ALKPHOS, BILITOT, PROT, ALBUMIN in the last 168 hours. No results for input(s): LIPASE, AMYLASE in the last 168 hours. No results for input(s): AMMONIA in the last 168 hours. CBC: No results for input(s): WBC, NEUTROABS, HGB, HCT, MCV, PLT in the last 168  hours. Cardiac Enzymes: No results for input(s): CKTOTAL, CKMB, CKMBINDEX, TROPONINI in the last 168 hours. BNP: BNP (last 3 results) No results for input(s): BNP in the last 8760 hours.  ProBNP (last 3 results) No results for input(s): PROBNP in the last 8760 hours.  CBG:  Recent Labs Lab 05/30/15 1705 05/30/15 2110 05/31/15 0025 05/31/15 0727 05/31/15 1135  GLUCAP 110* 218* 164* 127* 132*       Signed:  Deronte Solis  [facility] 05/31/2015, 1:05 PM

## 2015-05-31 NOTE — Procedures (Signed)
History: Marilyn Robinson is an 82 y.o. female patient with altered mental status. Routine inpatient EEG was performed for further evaluation.   Patient Active Problem List   Diagnosis Date Noted  . Stroke-like episode ([place]) 05/29/2015  . Hyponatremia 05/29/2015  . Stroke-like symptoms 05/29/2015  . Hypertension   . Diabetes mellitus without complication ([place])   . GERD (gastroesophageal reflux disease)   . Diabetes mellitus with complication ([place])      Current facility-administered medications:  .   stroke: mapping our early stages of recovery book, , Does not apply, Once, Radene Gunning, NP .  0.9 %  sodium chloride infusion, , Intravenous, Continuous, Maryann Mikhail, DO, Last Rate: 75 mL/hr at 05/31/15 0014 .  acetaminophen (TYLENOL) tablet 650 mg, 650 mg, Oral, Q4H PRN **OR** acetaminophen (TYLENOL) suppository 650 mg, 650 mg, Rectal, Q4H PRN, Radene Gunning, NP .  aspirin EC tablet 81 mg, 81 mg, Oral, Daily, Maryann Mikhail, DO, 81 mg at 05/31/15 1003 .  clopidogrel (PLAVIX) tablet 75 mg, 75 mg, Oral, q morning - 10a, Lezlie Octave Black, NP, 75 mg at 05/31/15 1003 .  hydrALAZINE (APRESOLINE) injection 5-10 mg, 5-10 mg, Intravenous, Q4H PRN, Waldemar Dickens, MD .  insulin aspart (novoLOG) injection 0-5 Units, 0-5 Units, Subcutaneous, QHS, Lezlie Octave Black, NP, 0 Units at 05/29/15 2200 .  insulin aspart (novoLOG) injection 0-9 Units, 0-9 Units, Subcutaneous, TID WC, Radene Gunning, NP, 1 Units at 05/30/15 1319 .  pantoprazole (PROTONIX) EC tablet 20 mg, 20 mg, Oral, QAC breakfast, Lezlie Octave Black, NP, 20 mg at 05/31/15 1003 .  rosuvastatin (CRESTOR) tablet 10 mg, 10 mg, Oral, QODAY, Lezlie Octave Black, NP, 10 mg at 05/31/15 1003 .  senna-docusate (Senokot-S) tablet 1 tablet, 1 tablet, Oral, QHS PRN, Radene Gunning, NP, 1 tablet at 05/30/15 2124   Introduction:  This is a 19 channel routine scalp EEG performed at the bedside with bipolar and monopolar montages arranged in accordance to the international  10/20 system of electrode placement. One channel was dedicated to EKG recording.   Findings:  The background rhythm was normal 9-10 Hz alpha . No definite evidence of abnormal epileptiform discharges or electrographic seizures were noted during this recording.   Impression:  Unremarkable awake and drowsy routine inpatient EEG. Clinical correlation is recommended .

## 2015-07-03 DIAGNOSIS — D0590 Unspecified type of carcinoma in situ of unspecified breast: Secondary | ICD-10-CM | POA: Diagnosis not present

## 2015-08-08 ENCOUNTER — Ambulatory Visit: Payer: Medicare PPO | Admitting: Neurology

## 2015-09-22 DIAGNOSIS — I160 Hypertensive urgency: Secondary | ICD-10-CM | POA: Insufficient documentation

## 2015-09-22 DIAGNOSIS — I6523 Occlusion and stenosis of bilateral carotid arteries: Secondary | ICD-10-CM | POA: Insufficient documentation

## 2015-09-22 DIAGNOSIS — G459 Transient cerebral ischemic attack, unspecified: Secondary | ICD-10-CM | POA: Insufficient documentation

## 2015-09-22 DIAGNOSIS — R8281 Pyuria: Secondary | ICD-10-CM

## 2015-09-22 DIAGNOSIS — E876 Hypokalemia: Secondary | ICD-10-CM

## 2015-09-22 HISTORY — DX: Occlusion and stenosis of bilateral carotid arteries: I65.23

## 2015-09-22 HISTORY — DX: Hypertensive urgency: I16.0

## 2015-09-22 HISTORY — DX: Pyuria: R82.81

## 2015-09-22 HISTORY — DX: Hypokalemia: E87.6

## 2016-01-02 DIAGNOSIS — I10 Essential (primary) hypertension: Secondary | ICD-10-CM

## 2016-01-02 DIAGNOSIS — Z86000 Personal history of in-situ neoplasm of breast: Secondary | ICD-10-CM | POA: Diagnosis not present

## 2016-01-02 DIAGNOSIS — Z170 Estrogen receptor positive status [ER+]: Secondary | ICD-10-CM

## 2016-07-02 DIAGNOSIS — D649 Anemia, unspecified: Secondary | ICD-10-CM

## 2016-07-02 DIAGNOSIS — N814 Uterovaginal prolapse, unspecified: Secondary | ICD-10-CM

## 2016-07-02 HISTORY — DX: Uterovaginal prolapse, unspecified: N81.4

## 2016-07-02 HISTORY — DX: Anemia, unspecified: D64.9

## 2016-07-13 DIAGNOSIS — I10 Essential (primary) hypertension: Secondary | ICD-10-CM

## 2016-07-13 DIAGNOSIS — Z86000 Personal history of in-situ neoplasm of breast: Secondary | ICD-10-CM

## 2016-07-13 DIAGNOSIS — Z170 Estrogen receptor positive status [ER+]: Secondary | ICD-10-CM

## 2016-08-19 ENCOUNTER — Encounter: Payer: Self-pay | Admitting: Cardiology

## 2016-08-19 ENCOUNTER — Ambulatory Visit (INDEPENDENT_AMBULATORY_CARE_PROVIDER_SITE_OTHER): Payer: Medicare PPO | Admitting: Cardiology

## 2016-08-19 VITALS — BP 162/70 | HR 55 | Ht 68.5 in | Wt 190.0 lb

## 2016-08-19 DIAGNOSIS — E785 Hyperlipidemia, unspecified: Secondary | ICD-10-CM

## 2016-08-19 DIAGNOSIS — I110 Hypertensive heart disease with heart failure: Secondary | ICD-10-CM

## 2016-08-19 DIAGNOSIS — I5042 Chronic combined systolic (congestive) and diastolic (congestive) heart failure: Secondary | ICD-10-CM

## 2016-08-19 DIAGNOSIS — E871 Hypo-osmolality and hyponatremia: Secondary | ICD-10-CM

## 2016-08-19 DIAGNOSIS — I2510 Atherosclerotic heart disease of native coronary artery without angina pectoris: Secondary | ICD-10-CM

## 2016-08-19 MED ORDER — IRBESARTAN 150 MG PO TABS: 150.0000 mg | ORAL_TABLET | Freq: Every day | ORAL | 3 refills | Status: DC

## 2016-08-19 NOTE — Patient Instructions (Addendum)
Medication Instructions:  Your physician has recommended you make the following change in your medication:  Take an additional tablet of Avapro in the evening if systolic is > 676 mm Hg   Labwork: Your physician recommends that you return for lab work in: today. CMP, lipid   Testing/Procedures: None  Follow-Up: Your physician recommends that you schedule a follow-up appointment in: 3 months.   Any Other Special Instructions Will Be Listed Below (If Applicable).     If you need a refill on your cardiac medications before your next appointment, please call your pharmacy.    Take an additional tablet of Avapro in the evening if systolic is > 720 mm Hg

## 2016-08-19 NOTE — Progress Notes (Signed)
Cardiology Office Note:    Date:  08/19/2016   ID:  Marilyn Robinson, DOB Feb 07, 1933, MRN [id number]  PCP:  Myrlene Broker, MD  Cardiologist:  Shirlee More, MD    Referring MD: Myrlene Broker, MD    ASSESSMENT:    1. Chronic combined systolic and diastolic heart failure ([place])   2. Hypertensive heart failure ([place])   3. Mild CAD   4. Hyperlipidemia, unspecified hyperlipidemia type   5. Hyponatremia    PLAN:    In order of problems listed above:  1. Stable compensated continue current loop diuretic as well as beta blocker ARB and hydralazine 2. Blood pressures been erratic since she has been switched from Diovan, I asked her to check blood pressure in the afternoon take an extra dose of ARB if needed. She'll continue current diuretic and beta blocker and hydralazine 3. Stable continue current medical treatment she has aspirin intolerance with recurrent GI bleeding 4. Stable continue her statin 5. Stable I drew labs today including a CMP look at sodium liver function for toxicity of statin and LDL for efficacy with a copy to her PCP   Next appointment: 3 months   Medication Adjustments/Labs and Tests Ordered: Current medicines are reviewed at length with the patient today.  Concerns regarding medicines are outlined above.  Orders Placed This Encounter  Procedures  . Comprehensive Metabolic Panel (CMET)  . Lipid Panel w/o Chol/HDL Ratio   Meds ordered this encounter  Medications  . irbesartan (AVAPRO) 150 MG tablet    Sig: Take 1 tablet (150 mg total) by mouth daily. Take an additional tablet in the evening if BP> 706 systolic    Dispense:  237 tablet    Refill:  3    Chief Complaint  Patient presents with  . Follow-up    Follow up to discuss BP medications     History of Present Illness:    Marilyn Robinson is a 83 y.o. female with a hx of CHF, CAD, Dyslipidemia, HTN and hyponatremia  last seen 6 months ago. Compliance with diet, lifestyle and medications:  Yes Past Medical History:  Diagnosis Date  . Anemia 07/02/2016  . Bilateral carotid artery stenosis 09/22/2015  . BMI 31.0-31.9,adult 05/06/2015  . Breast cancer ([place])   . Chronic combined systolic and diastolic heart failure ([place]) 07/17/2014   Overview:  EF 56% 09/23/10  . Diabetes mellitus without complication ([place])   . Dislocation of right knee with lateral meniscus tear 03/15/2014  . Ductal carcinoma in situ (DCIS) of left breast 05/06/2015  . GERD (gastroesophageal reflux disease)   . Hyperlipidemia 07/17/2014  . Hypertension   . Hypertensive heart disease with heart failure ([place]) 07/17/2014  . Hypertensive urgency 09/22/2015  . Hypokalemia 09/22/2015  . Hyponatremia 05/29/2015  . Mild CAD 07/17/2014   Overview:  50% RCA stenosis in 2005  . Non morbid obesity 05/06/2015  . Pyuria 09/22/2015  . Right knee pain 05/17/2014  . S/P arthroscopy of knee 05/17/2014  . Stroke-like episode ([place]) 05/29/2015  . Stroke-like symptoms 05/29/2015  . TIA (transient ischemic attack)   . Uterine prolapse 07/02/2016    Past Surgical History:  Procedure Laterality Date  . BREAST BIOPSY    . CATARACT EXTRACTION    . CHOLECYSTECTOMY    . CORONARY ANGIOPLASTY WITH STENT PLACEMENT    . EYE SURGERY    . TUBAL LIGATION      Current Medications: Current Meds  Medication Sig  . acetaminophen (TYLENOL) 500 MG  tablet Take 500 mg by mouth every 6 (six) hours as needed for mild pain.  . carvedilol (COREG) 12.5 MG tablet Take 12.5 mg by mouth 2 (two) times daily.  . carvedilol (COREG) 25 MG tablet Take 25 mg by mouth 2 (two) times daily.   . clopidogrel (PLAVIX) 75 MG tablet Take 75 mg by mouth every morning.  . cyanocobalamin (,VITAMIN B-12,) 1000 MCG/ML injection Inject 1 mL into the muscle every 30 (thirty) days.  . furosemide (LASIX) 20 MG tablet Take 20 mg by mouth daily as needed for fluid or edema.   . hydrALAZINE (APRESOLINE) 25 MG tablet Take 25 mg by mouth 3 (three) times daily.  . hydrOXYzine  (ATARAX/VISTARIL) 25 MG tablet Take 25 mg by mouth 3 (three) times daily.  . irbesartan (AVAPRO) 150 MG tablet Take 1 tablet (150 mg total) by mouth daily. Take an additional tablet in the evening if BP> 637 systolic  . metFORMIN (GLUCOPHAGE-XR) 500 MG 24 hr tablet Take 500 mg by mouth daily with breakfast. 500 mg in the morning and 1,000 at night  . pantoprazole (PROTONIX) 40 MG tablet Take 40 mg by mouth daily.  . rosuvastatin (CRESTOR) 10 MG tablet Take 10 mg by mouth every other day.  . [DISCONTINUED] irbesartan (AVAPRO) 150 MG tablet Take 150 mg by mouth daily.     Allergies:   Antihistamines, diphenhydramine-type; Lipitor [atorvastatin]; Penicillins; Allopurinol; and Aspirin   Social History   Social History  . Marital status: Married    Spouse name: N/A  . Number of children: N/A  . Years of education: N/A   Social History Main Topics  . Smoking status: Former Research scientist (life sciences)  . Smokeless tobacco: Never Used  . Alcohol use No  . Drug use: No  . Sexual activity: Not Asked   Other Topics Concern  . None   Social History Narrative  . None     Family History: The patient's family history includes CAD in her father and mother; Diabetes in her father and mother; Stroke in her mother. ROS:   Please see the history of present illness.    All other systems reviewed and are negative.  EKGs/Labs/Other Studies Reviewed:      Recent Labs:07/02/16 na 130 k 5.1 Cr 0.83 No results found for requested labs within last 8760 hours.  Recent Lipid Panel    Component Value Date/Time   CHOL 119 05/29/2015 1549   TRIG 108 05/29/2015 1549   HDL 36 (L) 05/29/2015 1549   CHOLHDL 3.3 05/29/2015 1549   VLDL 22 05/29/2015 1549   LDLCALC 61 05/29/2015 1549    Physical Exam:    VS:  BP (!) 182/82 (BP Location: Right Arm, Patient Position: Sitting)   Pulse (!) 55   Ht 5' 8.5" (1.74 m)   Wt 190 lb (86.2 kg)   SpO2 98%   BMI 28.47 kg/m     Wt Readings from Last 3 Encounters:  08/19/16  190 lb (86.2 kg)  05/29/15 182 lb (82.6 kg)     GEN:  Well nourished, well developed in no acute distress HEENT: Normal NECK: No JVD; No carotid bruits LYMPHATICS: No lymphadenopathy CARDIAC: RRR, no murmurs, rubs, gallops RESPIRATORY:  Clear to auscultation without rales, wheezing or rhonchi  ABDOMEN: Soft, non-tender, non-distended MUSCULOSKELETAL:  No edema; No deformity  SKIN: Warm and dry NEUROLOGIC:  Alert and oriented x 3 PSYCHIATRIC:  Normal affect    Signed, Shirlee More, MD  08/19/2016 10:21 AM    [facility]

## 2016-08-20 LAB — COMPREHENSIVE METABOLIC PANEL
A/G RATIO: 1.9 (ref 1.2–2.2)
ALBUMIN: 4.5 g/dL (ref 3.5–4.7)
ALT: 15 IU/L (ref 0–32)
AST: 19 IU/L (ref 0–40)
Alkaline Phosphatase: 57 IU/L (ref 39–117)
BILIRUBIN TOTAL: 0.4 mg/dL (ref 0.0–1.2)
BUN / CREAT RATIO: 23 (ref 12–28)
BUN: 19 mg/dL (ref 8–27)
CHLORIDE: 97 mmol/L (ref 96–106)
CO2: 21 mmol/L (ref 20–29)
Calcium: 9.9 mg/dL (ref 8.7–10.3)
Creatinine, Ser: 0.81 mg/dL (ref 0.57–1.00)
GFR, EST AFRICAN AMERICAN: 79 mL/min/{1.73_m2} (ref >59)
GFR, EST NON AFRICAN AMERICAN: 69 mL/min/{1.73_m2} (ref >59)
GLOBULIN, TOTAL: 2.4 g/dL (ref 1.5–4.5)
Glucose: 115 mg/dL — ABNORMAL HIGH (ref 65–99)
POTASSIUM: 5.3 mmol/L — AB (ref 3.5–5.2)
SODIUM: 133 mmol/L — AB (ref 134–144)
TOTAL PROTEIN: 6.9 g/dL (ref 6.0–8.5)

## 2016-08-20 LAB — LIPID PANEL W/O CHOL/HDL RATIO
Cholesterol, Total: 119 mg/dL (ref 100–199)
HDL: 41 mg/dL (ref >39)
LDL Calculated: 57 mg/dL (ref 0–99)
Triglycerides: 107 mg/dL (ref 0–149)
VLDL Cholesterol Cal: 21 mg/dL (ref 5–40)

## 2016-08-20 NOTE — Addendum Note (Signed)
Addended by: Jossie Ng on: 08/20/2016 09:38 AM   Modules accepted: Orders

## 2016-09-03 ENCOUNTER — Ambulatory Visit: Payer: Medicare PPO | Admitting: Cardiology

## 2016-09-04 ENCOUNTER — Telehealth: Payer: Self-pay | Admitting: Cardiology

## 2016-09-04 ENCOUNTER — Other Ambulatory Visit: Payer: Self-pay

## 2016-09-04 MED ORDER — TELMISARTAN 20 MG PO TABS: 20.0000 mg | ORAL_TABLET | Freq: Every day | ORAL | 11 refills | Status: DC

## 2016-09-04 MED ORDER — TELMISARTAN 20 MG PO TABS
20.0000 mg | ORAL_TABLET | Freq: Every day | ORAL | 11 refills | Status: DC
Start: 2016-09-04 — End: 2016-11-25

## 2016-09-04 NOTE — Telephone Encounter (Signed)
Stop avapro=Irbesartin  New micardis 20 mg a day, take a second in PM if systolic BP > 320

## 2016-09-04 NOTE — Telephone Encounter (Signed)
Patient states that Avapro is making her blood pressure drop in the morning (yesterday 117/64) and feel weak. In the afternoon it goes up. Yesterday in the afternoon BP was 211/94 and effected her vision (blurry). Last night she had a dull headache. Please advise if there another medication she can take or a dosage change.

## 2016-09-04 NOTE — Telephone Encounter (Signed)
Patient states that her BP med is making her feel better, she states her BP was 211/94 and going up and down all day. She also states some eye blurriness and mild headache. She wants to talk about switching new med, she does not like the Generic Avapro. Please call her.

## 2016-09-04 NOTE — Telephone Encounter (Signed)
Patient advised to start Micardis 20 mg daily. Patient advised to take a second dose in the evening if blood pressure greater than 170. Patient verbalized understanding.

## 2016-10-05 DIAGNOSIS — Z7901 Long term (current) use of anticoagulants: Secondary | ICD-10-CM | POA: Insufficient documentation

## 2016-10-05 DIAGNOSIS — J3089 Other allergic rhinitis: Secondary | ICD-10-CM | POA: Insufficient documentation

## 2016-10-05 DIAGNOSIS — Z23 Encounter for immunization: Secondary | ICD-10-CM | POA: Insufficient documentation

## 2016-10-05 DIAGNOSIS — N3945 Continuous leakage: Secondary | ICD-10-CM

## 2016-10-05 DIAGNOSIS — Z9181 History of falling: Secondary | ICD-10-CM | POA: Insufficient documentation

## 2016-10-05 HISTORY — DX: History of falling: Z91.81

## 2016-10-05 HISTORY — DX: Other allergic rhinitis: J30.89

## 2016-10-05 HISTORY — DX: Long term (current) use of anticoagulants: Z79.01

## 2016-10-05 HISTORY — DX: Continuous leakage: N39.45

## 2016-10-05 HISTORY — DX: Encounter for immunization: Z23

## 2016-10-19 DIAGNOSIS — I6381 Other cerebral infarction due to occlusion or stenosis of small artery: Secondary | ICD-10-CM

## 2016-10-19 HISTORY — DX: Other cerebral infarction due to occlusion or stenosis of small artery: I63.81

## 2016-10-23 DIAGNOSIS — Z8673 Personal history of transient ischemic attack (TIA), and cerebral infarction without residual deficits: Secondary | ICD-10-CM

## 2016-10-23 DIAGNOSIS — Z6379 Other stressful life events affecting family and household: Secondary | ICD-10-CM

## 2016-10-23 HISTORY — DX: Other stressful life events affecting family and household: Z63.79

## 2016-10-23 HISTORY — DX: Personal history of transient ischemic attack (TIA), and cerebral infarction without residual deficits: Z86.73

## 2016-10-29 DIAGNOSIS — R531 Weakness: Secondary | ICD-10-CM | POA: Diagnosis not present

## 2016-10-29 DIAGNOSIS — I10 Essential (primary) hypertension: Secondary | ICD-10-CM | POA: Diagnosis not present

## 2016-10-29 DIAGNOSIS — R55 Syncope and collapse: Secondary | ICD-10-CM | POA: Diagnosis not present

## 2016-10-29 DIAGNOSIS — E871 Hypo-osmolality and hyponatremia: Secondary | ICD-10-CM | POA: Diagnosis not present

## 2016-10-29 DIAGNOSIS — W19XXXA Unspecified fall, initial encounter: Secondary | ICD-10-CM | POA: Diagnosis not present

## 2016-10-30 DIAGNOSIS — I10 Essential (primary) hypertension: Secondary | ICD-10-CM | POA: Diagnosis not present

## 2016-10-30 DIAGNOSIS — E871 Hypo-osmolality and hyponatremia: Secondary | ICD-10-CM | POA: Diagnosis not present

## 2016-10-30 DIAGNOSIS — R531 Weakness: Secondary | ICD-10-CM | POA: Diagnosis not present

## 2016-10-30 DIAGNOSIS — W19XXXA Unspecified fall, initial encounter: Secondary | ICD-10-CM | POA: Diagnosis not present

## 2016-10-30 DIAGNOSIS — R55 Syncope and collapse: Secondary | ICD-10-CM | POA: Diagnosis not present

## 2016-11-09 DIAGNOSIS — Z09 Encounter for follow-up examination after completed treatment for conditions other than malignant neoplasm: Secondary | ICD-10-CM

## 2016-11-09 HISTORY — DX: Encounter for follow-up examination after completed treatment for conditions other than malignant neoplasm: Z09

## 2016-11-19 ENCOUNTER — Ambulatory Visit: Payer: Medicare PPO | Admitting: Cardiology

## 2016-11-24 NOTE — Progress Notes (Signed)
Cardiology Office Note:    Date:  11/25/2016   ID:  Marilyn Robinson, DOB 11-Jan-1934, MRN [id number]  PCP:  Myrlene Broker, MD  Cardiologist:  Shirlee More, MD    Referring MD: Myrlene Broker, MD    ASSESSMENT:    1. Chronic combined systolic and diastolic heart failure ([place])   2. Hypertensive heart failure ([place])   3. Hyperlipidemia, unspecified hyperlipidemia type   4. Hyponatremia    PLAN:    In order of problems listed above:  1. Stable compensated continue her current diuretic 2. Not well controlled, in the evenings her systolic blood pressure is been elevated as high as 160-170 to check her blood pressure several times a day and take additional dose of hydralazine as needed goal systolic blood pressure 2:44 to 010 systolic 3. Stable continue high intensity statin 4. Stable continue sodium restriction   Next appointment: 3 months   Medication Adjustments/Labs and Tests Ordered: Current medicines are reviewed at length with the patient today.  Concerns regarding medicines are outlined above.  No orders of the defined types were placed in this encounter.  No orders of the defined types were placed in this encounter.   Chief Complaint  Patient presents with  . Follow-up    hypertension  . Congestive Heart Failure  . Atrial Fibrillation  . Hypertension  . Hyperlipidemia    History of Present Illness:    Marilyn Robinson is a 83 y.o. female with a hx of CHF, CAD, Dyslipidemia, HTN and symptomatic hyponatremia last seen 3 months ago. Compliance with diet, lifestyle and medications: yes She is very concerned with a recent lacunar stroke and was given the impression by the physicians who cared for that blood pressure not being well controlled was a precipitant. Since then she finds in the afternoon and early evening systolic blood pressures of 272-536 systolic. She has a history of resistant hypertension and takes multiple antihypertensive agents and is back on  her ARB valsartan. She is not having angina dyspnea palpitation or syncope.  Admit date: 10/19/2016 Discharge date: 10/20/2016  Discharge Service: [facility] Hospital[facility] Discharge Attending Physician:Binaya Raman Dahal, MD Discharge to: Home with home health services Discharge Diagnoses: Principal Problem: Acute lacunar infarction Active Problems: Diabetes mellitus without complication ([place]) Hyperlipidemia Hypertension History of TIA (transient ischemic attack) and stroke  Past Medical History:  Diagnosis Date  . Anemia 07/02/2016  . Bilateral carotid artery stenosis 09/22/2015  . BMI 31.0-31.9,adult 05/06/2015  . Breast cancer ([place])   . Chronic combined systolic and diastolic heart failure ([place]) 07/17/2014   Overview:  EF 56% 09/23/10  . Diabetes mellitus without complication ([place])   . Dislocation of right knee with lateral meniscus tear 03/15/2014  . Ductal carcinoma in situ (DCIS) of left breast 05/06/2015  . GERD (gastroesophageal reflux disease)   . Hyperlipidemia 07/17/2014  . Hypertension   . Hypertensive heart disease with heart failure ([place]) 07/17/2014  . Hypertensive urgency 09/22/2015  . Hypokalemia 09/22/2015  . Hyponatremia 05/29/2015  . Mild CAD 07/17/2014   Overview:  50% RCA stenosis in 2005  . Non morbid obesity 05/06/2015  . Pyuria 09/22/2015  . Right knee pain 05/17/2014  . S/P arthroscopy of knee 05/17/2014  . Stroke-like episode ([place]) 05/29/2015  . Stroke-like symptoms 05/29/2015  . TIA (transient ischemic attack)   . Uterine prolapse 07/02/2016    Past Surgical History:  Procedure Laterality Date  . BREAST BIOPSY    . CATARACT EXTRACTION    . CHOLECYSTECTOMY    .  CORONARY ANGIOPLASTY WITH STENT PLACEMENT    . EYE SURGERY    . TUBAL LIGATION      Current Medications: Current Meds  Medication Sig  . acetaminophen (TYLENOL) 500 MG tablet Take 500 mg by mouth every 6 (six) hours as needed for mild pain.  . carvedilol (COREG) 12.5 MG tablet Take 12.5 mg by mouth 2 (two)  times daily.  . carvedilol (COREG) 25 MG tablet Take 25 mg by mouth 2 (two) times daily.   . clopidogrel (PLAVIX) 75 MG tablet Take 75 mg by mouth every morning.  . cyanocobalamin (,VITAMIN B-12,) 1000 MCG/ML injection Inject 1 mL into the muscle every 30 (thirty) days.  . furosemide (LASIX) 20 MG tablet Take 20 mg by mouth daily as needed for fluid or edema.   . hydrALAZINE (APRESOLINE) 25 MG tablet Take 25 mg by mouth 3 (three) times daily.  . metFORMIN (GLUCOPHAGE-XR) 500 MG 24 hr tablet Take 500 mg by mouth daily with breakfast. 500 mg in the morning and 1,000 at night  . mirtazapine (REMERON) 15 MG tablet Take 15 mg by mouth.  . pantoprazole (PROTONIX) 40 MG tablet Take 40 mg by mouth daily.  . rosuvastatin (CRESTOR) 10 MG tablet Take 10 mg by mouth every other day.  . valsartan (DIOVAN) 320 MG tablet Take 320 mg daily by mouth.  . [DISCONTINUED] telmisartan (MICARDIS) 20 MG tablet Take 1 tablet (20 mg total) by mouth daily. Take 1 extra tablet in the evening if BP>170     Allergies:   Antihistamines, diphenhydramine-type; Lipitor [atorvastatin]; Penicillins; Allopurinol; and Aspirin   Social History   Socioeconomic History  . Marital status: Married    Spouse name: None  . Number of children: None  . Years of education: None  . Highest education level: None  Social Needs  . Financial resource strain: None  . Food insecurity - worry: None  . Food insecurity - inability: None  . Transportation needs - medical: None  . Transportation needs - non-medical: None  Occupational History  . None  Tobacco Use  . Smoking status: Former Research scientist (life sciences)  . Smokeless tobacco: Never Used  Substance and Sexual Activity  . Alcohol use: No  . Drug use: No  . Sexual activity: None  Other Topics Concern  . None  Social History Narrative  . None     Family History: The patient's family history includes CAD in her father and mother; Diabetes in her father and mother; Stroke in her mother. ROS:     Please see the history of present illness.    All other systems reviewed and are negative.  EKGs/Labs/Other Studies Reviewed:    The following studies were reviewed today:   MRI: IMPRESSION: 1. Acute lacunar infarct in the low right thalamus. 2. Chronic small vessel ischemia with multiple remote lacunar infarcts in the deep gray nuclei and right pons. Recent Labs: 08/19/2016: ALT 15; BUN 19; Creatinine, Ser 0.81; Potassium 5.3; Sodium 133  Recent Lipid Panel    Component Value Date/Time   CHOL 119 08/19/2016 1042   TRIG 107 08/19/2016 1042   HDL 41 08/19/2016 1042   CHOLHDL 3.3 05/29/2015 1549   VLDL 22 05/29/2015 1549   LDLCALC 57 08/19/2016 1042    Physical Exam:    VS:  BP (!) 146/64 (BP Location: Left Arm, Patient Position: Sitting, Cuff Size: Normal)   Pulse 60   Ht 5' 8.5" (1.74 m)   Wt 190 lb (86.2 kg)   SpO2 96%  BMI 28.47 kg/m     Wt Readings from Last 3 Encounters:  11/25/16 190 lb (86.2 kg)  08/19/16 190 lb (86.2 kg)  05/29/15 182 lb (82.6 kg)    152/60 RUE 138/60 LUE GEN:  Well nourished, well developed in no acute distress HEENT: Normal NECK: No JVD; No carotid bruits LYMPHATICS: No lymphadenopathy CARDIAC: RRR, no murmurs, rubs, gallops RESPIRATORY:  Clear to auscultation without rales, wheezing or rhonchi  ABDOMEN: Soft, non-tender, non-distended MUSCULOSKELETAL:  No edema; No deformity  SKIN: Warm and dry NEUROLOGIC:  Alert and oriented x 3 PSYCHIATRIC:  Normal affect    Signed, Shirlee More, MD  11/25/2016 10:09 AM    [place]

## 2016-11-25 ENCOUNTER — Ambulatory Visit (INDEPENDENT_AMBULATORY_CARE_PROVIDER_SITE_OTHER): Payer: Medicare PPO | Admitting: Cardiology

## 2016-11-25 ENCOUNTER — Encounter: Payer: Self-pay | Admitting: Cardiology

## 2016-11-25 VITALS — BP 146/64 | HR 60 | Ht 68.5 in | Wt 190.0 lb

## 2016-11-25 DIAGNOSIS — I5042 Chronic combined systolic (congestive) and diastolic (congestive) heart failure: Secondary | ICD-10-CM

## 2016-11-25 DIAGNOSIS — E871 Hypo-osmolality and hyponatremia: Secondary | ICD-10-CM | POA: Diagnosis not present

## 2016-11-25 DIAGNOSIS — I110 Hypertensive heart disease with heart failure: Secondary | ICD-10-CM

## 2016-11-25 DIAGNOSIS — E785 Hyperlipidemia, unspecified: Secondary | ICD-10-CM | POA: Diagnosis not present

## 2016-11-25 NOTE — Patient Instructions (Signed)
Medication Instructions:  Your physician has recommended you make the following change in your medication:  INCREASE hydralazine to 37.5 mg (1.5 tablets) if systolic blood pressure greater than 165.  Labwork: None  Testing/Procedures: None  Follow-Up: Your physician wants you to follow-up in: 3 months. You will receive a reminder letter in the mail two months in advance. If you don't receive a letter, please call our office to schedule the follow-up appointment.  Any Other Special Instructions Will Be Listed Below (If Applicable).     If you need a refill on your cardiac medications before your next appointment, please call your pharmacy.

## 2017-01-07 DIAGNOSIS — M109 Gout, unspecified: Secondary | ICD-10-CM | POA: Insufficient documentation

## 2017-01-07 DIAGNOSIS — M75120 Complete rotator cuff tear or rupture of unspecified shoulder, not specified as traumatic: Secondary | ICD-10-CM

## 2017-01-07 DIAGNOSIS — I6529 Occlusion and stenosis of unspecified carotid artery: Secondary | ICD-10-CM | POA: Insufficient documentation

## 2017-01-07 DIAGNOSIS — R55 Syncope and collapse: Secondary | ICD-10-CM | POA: Insufficient documentation

## 2017-01-07 DIAGNOSIS — E038 Other specified hypothyroidism: Secondary | ICD-10-CM | POA: Insufficient documentation

## 2017-01-07 DIAGNOSIS — H8110 Benign paroxysmal vertigo, unspecified ear: Secondary | ICD-10-CM | POA: Insufficient documentation

## 2017-01-07 DIAGNOSIS — K8070 Calculus of gallbladder and bile duct without cholecystitis without obstruction: Secondary | ICD-10-CM

## 2017-01-07 DIAGNOSIS — Z01810 Encounter for preprocedural cardiovascular examination: Secondary | ICD-10-CM | POA: Insufficient documentation

## 2017-01-07 DIAGNOSIS — M5030 Other cervical disc degeneration, unspecified cervical region: Secondary | ICD-10-CM | POA: Insufficient documentation

## 2017-01-07 DIAGNOSIS — Z9181 History of falling: Secondary | ICD-10-CM | POA: Insufficient documentation

## 2017-01-07 DIAGNOSIS — I429 Cardiomyopathy, unspecified: Secondary | ICD-10-CM | POA: Insufficient documentation

## 2017-01-07 HISTORY — DX: Encounter for preprocedural cardiovascular examination: Z01.810

## 2017-01-07 HISTORY — DX: Occlusion and stenosis of unspecified carotid artery: I65.29

## 2017-01-07 HISTORY — DX: Other cervical disc degeneration, unspecified cervical region: M50.30

## 2017-01-07 HISTORY — DX: Cardiomyopathy, unspecified: I42.9

## 2017-01-07 HISTORY — DX: Syncope and collapse: R55

## 2017-01-07 HISTORY — DX: History of falling: Z91.81

## 2017-01-07 HISTORY — DX: Benign paroxysmal vertigo, unspecified ear: H81.10

## 2017-01-07 HISTORY — DX: Calculus of gallbladder and bile duct without cholecystitis without obstruction: K80.70

## 2017-01-07 HISTORY — DX: Complete rotator cuff tear or rupture of unspecified shoulder, not specified as traumatic: M75.120

## 2017-01-07 HISTORY — DX: Gout, unspecified: M10.9

## 2017-01-07 HISTORY — DX: Other specified hypothyroidism: E03.8

## 2017-02-08 DIAGNOSIS — Z170 Estrogen receptor positive status [ER+]: Secondary | ICD-10-CM

## 2017-02-08 DIAGNOSIS — Z86000 Personal history of in-situ neoplasm of breast: Secondary | ICD-10-CM | POA: Diagnosis not present

## 2017-02-08 DIAGNOSIS — I10 Essential (primary) hypertension: Secondary | ICD-10-CM | POA: Diagnosis not present

## 2017-02-22 ENCOUNTER — Ambulatory Visit: Payer: Medicare PPO | Admitting: Cardiology

## 2017-04-30 DIAGNOSIS — R600 Localized edema: Secondary | ICD-10-CM

## 2017-04-30 DIAGNOSIS — R609 Edema, unspecified: Secondary | ICD-10-CM | POA: Insufficient documentation

## 2017-04-30 HISTORY — DX: Localized edema: R60.0

## 2017-08-10 DIAGNOSIS — Z86000 Personal history of in-situ neoplasm of breast: Secondary | ICD-10-CM | POA: Diagnosis not present

## 2017-08-10 DIAGNOSIS — Z170 Estrogen receptor positive status [ER+]: Secondary | ICD-10-CM

## 2017-08-25 DIAGNOSIS — E782 Mixed hyperlipidemia: Secondary | ICD-10-CM | POA: Insufficient documentation

## 2017-08-25 HISTORY — DX: Mixed hyperlipidemia: E78.2

## 2017-11-04 ENCOUNTER — Ambulatory Visit (INDEPENDENT_AMBULATORY_CARE_PROVIDER_SITE_OTHER): Payer: Medicare PPO | Admitting: Cardiology

## 2017-11-04 ENCOUNTER — Encounter: Payer: Self-pay | Admitting: Cardiology

## 2017-11-04 VITALS — BP 170/60 | HR 68 | Ht 68.5 in | Wt 186.0 lb

## 2017-11-04 DIAGNOSIS — I5042 Chronic combined systolic (congestive) and diastolic (congestive) heart failure: Secondary | ICD-10-CM | POA: Diagnosis not present

## 2017-11-04 DIAGNOSIS — I110 Hypertensive heart disease with heart failure: Secondary | ICD-10-CM | POA: Diagnosis not present

## 2017-11-04 DIAGNOSIS — I2510 Atherosclerotic heart disease of native coronary artery without angina pectoris: Secondary | ICD-10-CM

## 2017-11-04 NOTE — Patient Instructions (Signed)
Medication Instructions:  Your physician recommends that you continue on your current medications as directed. Please refer to the Current Medication list given to you today.  If you need a refill on your cardiac medications before your next appointment, please call your pharmacy.   Lab work: NONE If you have labs (blood work) drawn today and your tests are completely normal, you will receive your results only by: . MyChart Message (if you have MyChart) OR . A paper copy in the mail If you have any lab test that is abnormal or we need to change your treatment, we will call you to review the results.  Testing/Procedures: You had an EKG today.  Follow-Up: At [facility], you and your health needs are our priority.  As part of our continuing mission to provide you with exceptional heart care, we have created designated Provider Care Teams.  These Care Teams include your primary Cardiologist (physician) and Advanced Practice Providers (APPs -  Physician Assistants and Nurse Practitioners) who all work together to provide you with the care you need, when you need it. . You will need a follow up appointment in 6 months.  Please call our office 2 months in advance to schedule this appointment.    Any Other Special Instructions Will Be Listed Below (If Applicable).   

## 2017-11-04 NOTE — Progress Notes (Signed)
Cardiology Office Note:    Date:  11/04/2017   ID:  Marilyn Robinson, DOB May 17, 1933, MRN [id number]  PCP:  Myrlene Broker, MD  Cardiologist:  Shirlee More, MD    Referring MD: Myrlene Broker, MD    ASSESSMENT:    1. Chronic combined systolic and diastolic heart failure ([place])   2. Hypertensive heart failure ([place])   3. Mild CAD    PLAN:    In order of problems listed above:  1. Heart failure is compensated she has no fluid overload New York Heart Association class I continue current treatment including a loop diuretic weight-based beta-blocker hydralazine and ARB. 2. Stable blood pressures mildly elevated in the office but is in range at home continue current treatment 3. Stable having no anginal discomfort New York Heart Association class I continue medical therapy her lipids are ideal at this time at home she requires an ischemia evaluation   Next appointment: 6 months   Medication Adjustments/Labs and Tests Ordered: Current medicines are reviewed at length with the patient today.  Concerns regarding medicines are outlined above.  Orders Placed This Encounter  Procedures  . EKG 12-Lead   No orders of the defined types were placed in this encounter.   Chief Complaint  Patient presents with  . Medication Management    When should pt stop plavix for dental work  . Follow-up  . Hypertension  . Coronary Artery Disease    History of Present Illness:    Marilyn Robinson is a 84 y.o. female with a hx of CHF, CAD, Dyslipidemia, HTN and symptomatic hyponatremia  last seen 11/25/16 after a lacunar stroke. Compliance with diet, lifestyle and medications: Yes  She is currently having oral surgery her dentist asked if she can be withdrawn from clopidogrel I think a compromise here is to hold 3 to 5 days before the procedure and resume 24 hours later.  Overall is done well serum sodium is normal no chest pain shortness of breath palpitation edema or recurrent TIA or  stroke. Past Medical History:  Diagnosis Date  . Anemia 07/02/2016  . Bilateral carotid artery stenosis 09/22/2015  . BMI 31.0-31.9,adult 05/06/2015  . Breast cancer ([place])   . Chronic combined systolic and diastolic heart failure ([place]) 07/17/2014   Overview:  EF 56% 09/23/10  . Diabetes mellitus without complication ([place])   . Dislocation of right knee with lateral meniscus tear 03/15/2014  . Ductal carcinoma in situ (DCIS) of left breast 05/06/2015  . GERD (gastroesophageal reflux disease)   . Hyperlipidemia 07/17/2014  . Hypertension   . Hypertensive heart disease with heart failure ([place]) 07/17/2014  . Hypertensive urgency 09/22/2015  . Hypokalemia 09/22/2015  . Hyponatremia 05/29/2015  . Mild CAD 07/17/2014   Overview:  50% RCA stenosis in 2005  . Non morbid obesity 05/06/2015  . Pyuria 09/22/2015  . Right knee pain 05/17/2014  . S/P arthroscopy of knee 05/17/2014  . Stroke-like episode ([place]) 05/29/2015  . Stroke-like symptoms 05/29/2015  . TIA (transient ischemic attack)   . Uterine prolapse 07/02/2016    Past Surgical History:  Procedure Laterality Date  . BREAST BIOPSY    . CATARACT EXTRACTION    . CHOLECYSTECTOMY    . CORONARY ANGIOPLASTY WITH STENT PLACEMENT    . EYE SURGERY    . TUBAL LIGATION      Current Medications: Current Meds  Medication Sig  . acetaminophen (TYLENOL) 500 MG tablet Take 500 mg by mouth every 6 (six) hours as  needed for mild pain.  . carvedilol (COREG) 12.5 MG tablet Take 12.5 mg by mouth 2 (two) times daily.  . carvedilol (COREG) 25 MG tablet Take 25 mg by mouth 2 (two) times daily.   . clopidogrel (PLAVIX) 75 MG tablet Take 75 mg by mouth every morning.  . cyanocobalamin (,VITAMIN B-12,) 1000 MCG/ML injection Inject 1 mL into the muscle every 30 (thirty) days.  . furosemide (LASIX) 20 MG tablet Take 20 mg by mouth daily as needed for fluid or edema.   . hydrALAZINE (APRESOLINE) 25 MG tablet Take 25 mg 3 (three) times daily by mouth. BP > 165 take an extra 1/2  of hydralazine   . metFORMIN (GLUCOPHAGE-XR) 500 MG 24 hr tablet Take 500 mg by mouth daily with breakfast. 500 mg in the morning and 1,000 at night  . mirtazapine (REMERON) 15 MG tablet Take 15 mg by mouth.  . pantoprazole (PROTONIX) 40 MG tablet Take 40 mg by mouth daily.  . rosuvastatin (CRESTOR) 10 MG tablet Take 10 mg by mouth every other day.  . valsartan (DIOVAN) 320 MG tablet Take 320 mg daily by mouth.     Allergies:   Antihistamines, diphenhydramine-type; Lipitor [atorvastatin]; Penicillins; Allopurinol; Aspirin; and Fenofibrate   Social History   Socioeconomic History  . Marital status: Married    Spouse name: Not on file  . Number of children: Not on file  . Years of education: Not on file  . Highest education level: Not on file  Occupational History  . Not on file  Social Needs  . Financial resource strain: Not on file  . Food insecurity:    Worry: Not on file    Inability: Not on file  . Transportation needs:    Medical: Not on file    Non-medical: Not on file  Tobacco Use  . Smoking status: Former Research scientist (life sciences)  . Smokeless tobacco: Never Used  Substance and Sexual Activity  . Alcohol use: No  . Drug use: No  . Sexual activity: Not on file  Lifestyle  . Physical activity:    Days per week: Not on file    Minutes per session: Not on file  . Stress: Not on file  Relationships  . Social connections:    Talks on phone: Not on file    Gets together: Not on file    Attends religious service: Not on file    Active member of club or organization: Not on file    Attends meetings of clubs or organizations: Not on file    Relationship status: Not on file  Other Topics Concern  . Not on file  Social History Narrative  . Not on file     Family History: The patient's family history includes CAD in her father and mother; Diabetes in her father and mother; Stroke in her mother. ROS:   Please see the history of present illness.    All other systems reviewed and are  negative.  EKGs/Labs/Other Studies Reviewed:    The following studies were reviewed today:  EKG:  EKG ordered today.  The ekg ordered today demonstrates sinus rhythm normal EKG  Recent Labs:   10/08/2017 CMP was normal potassium 5.5 lipid profile cholesterol 130 LDL 77 No results found for requested labs within last 8760 hours.  Recent Lipid Panel    Component Value Date/Time   CHOL 119 08/19/2016 1042   TRIG 107 08/19/2016 1042   HDL 41 08/19/2016 1042   CHOLHDL 3.3 05/29/2015 1549  VLDL 22 05/29/2015 1549   LDLCALC 57 08/19/2016 1042    Physical Exam:    VS:  BP (!) 170/60   Pulse 68   Ht 5' 8.5" (1.74 m)   Wt 186 lb (84.4 kg)   SpO2 94%   BMI 27.87 kg/m     Wt Readings from Last 3 Encounters:  11/04/17 186 lb (84.4 kg)  11/25/16 190 lb (86.2 kg)  08/19/16 190 lb (86.2 kg)     GEN:  Well nourished, well developed in no acute distress HEENT: Normal NECK: No JVD; No carotid bruits LYMPHATICS: No lymphadenopathy CARDIAC: RRR, no murmurs, rubs, gallops RESPIRATORY:  Clear to auscultation without rales, wheezing or rhonchi  ABDOMEN: Soft, non-tender, non-distended MUSCULOSKELETAL:  No edema; No deformity  SKIN: Warm and dry NEUROLOGIC:  Alert and oriented x 3 PSYCHIATRIC:  Normal affect    Signed, Shirlee More, MD  11/04/2017 3:57 PM    [place]

## 2018-02-04 DIAGNOSIS — Z4689 Encounter for fitting and adjustment of other specified devices: Secondary | ICD-10-CM | POA: Diagnosis not present

## 2018-02-10 DIAGNOSIS — Z86000 Personal history of in-situ neoplasm of breast: Secondary | ICD-10-CM | POA: Diagnosis not present

## 2018-02-11 DIAGNOSIS — H524 Presbyopia: Secondary | ICD-10-CM | POA: Diagnosis not present

## 2018-02-11 DIAGNOSIS — H43813 Vitreous degeneration, bilateral: Secondary | ICD-10-CM | POA: Diagnosis not present

## 2018-02-11 DIAGNOSIS — E113293 Type 2 diabetes mellitus with mild nonproliferative diabetic retinopathy without macular edema, bilateral: Secondary | ICD-10-CM | POA: Diagnosis not present

## 2018-02-11 DIAGNOSIS — H52223 Regular astigmatism, bilateral: Secondary | ICD-10-CM | POA: Diagnosis not present

## 2018-04-14 DIAGNOSIS — F329 Major depressive disorder, single episode, unspecified: Secondary | ICD-10-CM | POA: Diagnosis not present

## 2018-04-14 DIAGNOSIS — C9000 Multiple myeloma not having achieved remission: Secondary | ICD-10-CM | POA: Diagnosis not present

## 2018-04-14 DIAGNOSIS — I119 Hypertensive heart disease without heart failure: Secondary | ICD-10-CM | POA: Diagnosis not present

## 2018-04-14 DIAGNOSIS — F5104 Psychophysiologic insomnia: Secondary | ICD-10-CM

## 2018-04-14 DIAGNOSIS — Z7984 Long term (current) use of oral hypoglycemic drugs: Secondary | ICD-10-CM | POA: Diagnosis not present

## 2018-04-14 DIAGNOSIS — G459 Transient cerebral ischemic attack, unspecified: Secondary | ICD-10-CM | POA: Diagnosis not present

## 2018-04-14 DIAGNOSIS — E1165 Type 2 diabetes mellitus with hyperglycemia: Secondary | ICD-10-CM | POA: Diagnosis not present

## 2018-04-14 DIAGNOSIS — M4712 Other spondylosis with myelopathy, cervical region: Secondary | ICD-10-CM | POA: Diagnosis not present

## 2018-04-14 DIAGNOSIS — M9902 Segmental and somatic dysfunction of thoracic region: Secondary | ICD-10-CM | POA: Diagnosis not present

## 2018-04-14 DIAGNOSIS — M542 Cervicalgia: Secondary | ICD-10-CM | POA: Diagnosis not present

## 2018-04-14 DIAGNOSIS — F419 Anxiety disorder, unspecified: Secondary | ICD-10-CM | POA: Diagnosis not present

## 2018-04-14 DIAGNOSIS — E782 Mixed hyperlipidemia: Secondary | ICD-10-CM | POA: Diagnosis not present

## 2018-04-14 DIAGNOSIS — M9901 Segmental and somatic dysfunction of cervical region: Secondary | ICD-10-CM | POA: Diagnosis not present

## 2018-04-14 DIAGNOSIS — J3089 Other allergic rhinitis: Secondary | ICD-10-CM | POA: Diagnosis not present

## 2018-04-14 DIAGNOSIS — I110 Hypertensive heart disease with heart failure: Secondary | ICD-10-CM | POA: Diagnosis not present

## 2018-04-14 HISTORY — DX: Psychophysiologic insomnia: F51.04

## 2018-04-25 ENCOUNTER — Telehealth: Payer: Self-pay | Admitting: Cardiology

## 2018-04-25 NOTE — Telephone Encounter (Signed)
Virtual Visit Pre-Appointment Phone Call  Steps For Call:  1. Confirm consent - "In the setting of the current Covid19 crisis, you are scheduled for a (phone or video) visit with your provider on (date) at (time).  Just as we do with many in-office visits, in order for you to participate in this visit, we must obtain consent.  If you'd like, I can send this to your mychart (if signed up) or email for you to review.  Otherwise, I can obtain your verbal consent now.  All virtual visits are billed to your insurance company just like a normal visit would be.  By agreeing to a virtual visit, we'd like you to understand that the technology does not allow for your provider to perform an examination, and thus may limit your provider's ability to fully assess your condition.  Finally, though the technology is pretty good, we cannot assure that it will always work on either your or our end, and in the setting of a video visit, we may have to convert it to a phone-only visit.  In either situation, we cannot ensure that we have a secure connection.  Are you willing to proceed?"  2. Give patient instructions for WebEx download to smartphone as below if video visit  3. Advise patient to be prepared with any vital sign or heart rhythm information, their current medicines, and a piece of paper and pen handy for any instructions they may receive the day of their visit  4. Inform patient they will receive a phone call 15 minutes prior to their appointment time (may be from unknown caller ID) so they should be prepared to answer  5. Confirm that appointment type is correct in Epic appointment notes (video vs telephone)    TELEPHONE CALL NOTE  Marilyn Robinson has been deemed a candidate for a follow-up tele-health visit to limit community exposure during the Covid-19 pandemic. I spoke with the patient via phone to ensure availability of phone/video source, confirm preferred email & phone number, and discuss  instructions and expectations.  I reminded Marilyn Robinson to be prepared with any vital sign and/or heart rhythm information that could potentially be obtained via home monitoring, at the time of her visit. I reminded Marilyn Robinson to expect a phone call at the time of her visit if her visit.  Did the patient verbally acknowledge consent to treatment? YES  Frederic Jericho 04/25/2018 4:16 PM   DOWNLOADING THE [place], go to [employer] and type in WebEx in the search bar. [place] [employer], the blue/green circle. The app is free but as with any other app downloads, their phone may require them to verify saved payment information or Apple password. The patient does NOT have to create an account.  - If Android, ask patient to go to [employer] and type in WebEx in the search bar. [place] [employer], the blue/green circle. The app is free but as with any other app downloads, their phone may require them to verify saved payment information or Android password. The patient does NOT have to create an account.   CONSENT FOR TELE-HEALTH VISIT - PLEASE REVIEW  I hereby voluntarily request, consent and authorize [facility] and its employed or contracted physicians, physician assistants, nurse practitioners or other licensed health care professionals (the Practitioner), to provide me with telemedicine health care services (the Services") as deemed necessary by the treating Practitioner. I  acknowledge and consent to receive the Services by the Practitioner via telemedicine. I understand that the telemedicine visit will involve communicating with the Practitioner through live audiovisual communication technology and the disclosure of certain medical information by electronic transmission. I acknowledge that I have been given the opportunity to request an in-person assessment or other available alternative prior to the telemedicine visit and am  voluntarily participating in the telemedicine visit.  I understand that I have the right to withhold or withdraw my consent to the use of telemedicine in the course of my care at any time, without affecting my right to future care or treatment, and that the Practitioner or I may terminate the telemedicine visit at any time. I understand that I have the right to inspect all information obtained and/or recorded in the course of the telemedicine visit and may receive copies of available information for a reasonable fee.  I understand that some of the potential risks of receiving the Services via telemedicine include:   Delay or interruption in medical evaluation due to technological equipment failure or disruption;  Information transmitted may not be sufficient (e.g. poor resolution of images) to allow for appropriate medical decision making by the Practitioner; and/or   In rare instances, security protocols could fail, causing a breach of personal health information.  Furthermore, I acknowledge that it is my responsibility to provide information about my medical history, conditions and care that is complete and accurate to the best of my ability. I acknowledge that Practitioner's advice, recommendations, and/or decision may be based on factors not within their control, such as incomplete or inaccurate data provided by me or distortions of diagnostic images or specimens that may result from electronic transmissions. I understand that the practice of medicine is not an exact science and that Practitioner makes no warranties or guarantees regarding treatment outcomes. I acknowledge that I will receive a copy of this consent concurrently upon execution via email to the email address I last provided but may also request a printed copy by calling the office of [facility].    I understand that my insurance will be billed for this visit.   I have read or had this consent read to me.  I understand the  contents of this consent, which adequately explains the benefits and risks of the Services being provided via telemedicine.   I have been provided ample opportunity to ask questions regarding this consent and the Services and have had my questions answered to my satisfaction.  I give my informed consent for the services to be provided through the use of telemedicine in my medical care  By participating in this telemedicine visit I agree to the above.

## 2018-05-03 ENCOUNTER — Other Ambulatory Visit: Payer: Self-pay

## 2018-05-03 ENCOUNTER — Telehealth (INDEPENDENT_AMBULATORY_CARE_PROVIDER_SITE_OTHER): Payer: PPO | Admitting: Cardiology

## 2018-05-03 ENCOUNTER — Encounter: Payer: Self-pay | Admitting: Cardiology

## 2018-05-03 VITALS — BP 128/82 | Ht 68.5 in | Wt 183.4 lb

## 2018-05-03 DIAGNOSIS — Z7189 Other specified counseling: Secondary | ICD-10-CM

## 2018-05-03 DIAGNOSIS — I110 Hypertensive heart disease with heart failure: Secondary | ICD-10-CM

## 2018-05-03 DIAGNOSIS — E785 Hyperlipidemia, unspecified: Secondary | ICD-10-CM

## 2018-05-03 DIAGNOSIS — E119 Type 2 diabetes mellitus without complications: Secondary | ICD-10-CM

## 2018-05-03 DIAGNOSIS — I5042 Chronic combined systolic (congestive) and diastolic (congestive) heart failure: Secondary | ICD-10-CM

## 2018-05-03 DIAGNOSIS — I2510 Atherosclerotic heart disease of native coronary artery without angina pectoris: Secondary | ICD-10-CM

## 2018-05-03 DIAGNOSIS — E871 Hypo-osmolality and hyponatremia: Secondary | ICD-10-CM

## 2018-05-03 NOTE — Patient Instructions (Addendum)
Medication Instructions:  Your physician recommends that you continue on your current medications as directed. Please refer to the Current Medication list given to you today.  If you need a refill on your cardiac medications before your next appointment, please call your pharmacy.   Lab work: None  If you have labs (blood work) drawn today and your tests are completely normal, you will receive your results only by: Marland Kitchen MyChart Message (if you have MyChart) OR . A paper copy in the mail If you have any lab test that is abnormal or we need to change your treatment, we will call you to review the results.  Testing/Procedures: None  Follow-Up: At [facility], you and your health needs are our priority.  As part of our continuing mission to provide you with exceptional heart care, we have created designated Provider Care Teams.  These Care Teams include your primary Cardiologist (physician) and Advanced Practice Providers (APPs -  Physician Assistants and Nurse Practitioners) who all work together to provide you with the care you need, when you need it. You will need a follow up virtual appointment in 2 months: Tuesday, 07/05/2018, at 11:20 am.

## 2018-05-03 NOTE — Progress Notes (Signed)
Virtual Visit via Video Note   This visit type was conducted due to national recommendations for restrictions regarding the COVID-19 Pandemic (e.g. social distancing) in an effort to limit this patient's exposure and mitigate transmission in our community.  Due to her co-morbid illnesses, this patient is at least at moderate risk for complications without adequate follow up.  This format is felt to be most appropriate for this patient at this time.  All issues noted in this document were discussed and addressed.  A limited physical exam was performed with this format.  Please refer to the patient's chart for her consent to telehealth for [facility].   Evaluation Performed:  Follow-up visit  Date:  05/03/2018   ID:  Marilyn Robinson, DOB 20-Aug-1933, MRN [id number]  Patient Location: Home Provider Location: Home  PCP:  Myrlene Broker, MD  Cardiologist:  No primary care provider on file. Dr Bettina Gavia Electrophysiologist:  None   Chief Complaint:  Heart failure and hypertension  History of Present Illness:    Marilyn Robinson is a 85 y.o. female with a hx of CHF, CAD, Dyslipidemia, HTN, symptomatic hyponatremia  and  lacunar stroke .  She was last seen 11/04/2017.  She is doing well rarely leaves the home has groceries brought to the house and her daughter checks on her.  She has good handwashing and sanitation technique practices social isolation wears a mask when outdoors.  She has been put on Remeron and has had a marked improvement in the quality of her life.  Blood pressures been consistently less than 782 systolic and her weight is stable at 183 pounds.  She has had no edema orthopnea shortness of breath chest pain palpitation or syncope.  She has had no TIA.  The patient does not have symptoms concerning for COVID-19 infection (fever, chills, cough, or new shortness of breath).    Past Medical History:  Diagnosis Date  . Anemia 07/02/2016  . Bilateral carotid artery  stenosis 09/22/2015  . BMI 31.0-31.9,adult 05/06/2015  . Breast cancer ([place])   . Chronic combined systolic and diastolic heart failure ([place]) 07/17/2014   Overview:  EF 56% 09/23/10  . Diabetes mellitus without complication ([place])   . Dislocation of right knee with lateral meniscus tear 03/15/2014  . Ductal carcinoma in situ (DCIS) of left breast 05/06/2015  . GERD (gastroesophageal reflux disease)   . Hyperlipidemia 07/17/2014  . Hypertension   . Hypertensive heart disease with heart failure ([place]) 07/17/2014  . Hypertensive urgency 09/22/2015  . Hypokalemia 09/22/2015  . Hyponatremia 05/29/2015  . Mild CAD 07/17/2014   Overview:  50% RCA stenosis in 2005  . Non morbid obesity 05/06/2015  . Pyuria 09/22/2015  . Right knee pain 05/17/2014  . S/P arthroscopy of knee 05/17/2014  . Stroke-like episode ([place]) 05/29/2015  . Stroke-like symptoms 05/29/2015  . TIA (transient ischemic attack)   . Uterine prolapse 07/02/2016   Past Surgical History:  Procedure Laterality Date  . BREAST BIOPSY    . CATARACT EXTRACTION    . CHOLECYSTECTOMY    . CORONARY ANGIOPLASTY WITH STENT PLACEMENT    . EYE SURGERY    . TUBAL LIGATION       No outpatient medications have been marked as taking for the 05/03/18 encounter (Appointment) with Richardo Priest, MD.     Allergies:   Antihistamines, diphenhydramine-type; Lipitor [atorvastatin]; Penicillins; Allopurinol; Aspirin; and Fenofibrate   Social History   Tobacco Use  . Smoking status: Former Research scientist (life sciences)  .  Smokeless tobacco: Never Used  Substance Use Topics  . Alcohol use: No  . Drug use: No     Family Hx: The patient's family history includes CAD in her father and mother; Diabetes in her father and mother; Stroke in her mother.  ROS:   Please see the history of present illness.     All other systems reviewed and are negative.   Prior CV studies:   The following studies were reviewed today:    Labs/Other Tests and Data Reviewed:    EKG:  No ECG reviewed.   Recent Labs: 04/14/2018 A1c 6.8% sodium 132 potassium 5.5 creatinine 1.01  08/25/2017 LDL cholesterol 77  Recent Lipid Panel Lab Results  Component Value Date/Time   CHOL 119 08/19/2016 10:42 AM   TRIG 107 08/19/2016 10:42 AM   HDL 41 08/19/2016 10:42 AM   CHOLHDL 3.3 05/29/2015 03:49 PM   LDLCALC 57 08/19/2016 10:42 AM    Wt Readings from Last 3 Encounters:  11/04/17 186 lb (84.4 kg)  11/25/16 190 lb (86.2 kg)  08/19/16 190 lb (86.2 kg)     Objective:    Vital Signs:  There were no vitals taken for this visit.   VITAL SIGNS:  reviewed GEN:  no acute distress EYES:  sclerae anicteric, EOMI - Extraocular Movements Intact RESPIRATORY:  normal respiratory effort, symmetric expansion CARDIOVASCULAR:  no peripheral edema SKIN:  no rash, lesions or ulcers. MUSCULOSKELETAL:  no obvious deformities. NEURO:  alert and oriented x 3, no obvious focal deficit PSYCH:  normal affect  ASSESSMENT & PLAN:    1. Heart failure combined systolic and diastolic stable New York Heart Association class I continue her current diuretic and home self-management. 2. CAD stable asymptomatic New York Heart Association class I continue medical therapy including clopidogrel beta-blocker and statin.  She will need labs at her next office visit including a lipid profile proBNP 3. Hypertensive heart disease stable good blood pressure control continue current treatment including diuretic beta-blocker hydralazine and valsartan.  Her serum potassium is top normal and I asked her to avoid over-the-counter potassium supplements and will recheck renal function potassium at next office visit 4. Hyponatremia stable mild she restrict sodium 5. Hyperlipidemia stable continue high intensity statin recheck lipid profile last LDL was at target 6. Type 2 diabetes stable managed by her PCP  COVID-19 Education: The signs and symptoms of COVID-19 were discussed with the patient and how to seek care for testing (follow up  with PCP or arrange E-visit).  The importance of social distancing was discussed today.  Time:   Today, I have spent 28 minutes with the patient with telehealth technology discussing the above problems.     Medication Adjustments/Labs and Tests Ordered: Current medicines are reviewed at length with the patient today.  Concerns regarding medicines are outlined above.   Tests Ordered: No orders of the defined types were placed in this encounter.   Medication Changes: No orders of the defined types were placed in this encounter.   Disposition:  Follow up June 2020  SignedShirlee More, MD  05/03/2018 11:10 AM    [place]

## 2018-05-16 DIAGNOSIS — D0512 Intraductal carcinoma in situ of left breast: Secondary | ICD-10-CM | POA: Diagnosis not present

## 2018-05-16 DIAGNOSIS — Z853 Personal history of malignant neoplasm of breast: Secondary | ICD-10-CM | POA: Diagnosis not present

## 2018-05-16 DIAGNOSIS — R928 Other abnormal and inconclusive findings on diagnostic imaging of breast: Secondary | ICD-10-CM | POA: Diagnosis not present

## 2018-06-07 DIAGNOSIS — D0512 Intraductal carcinoma in situ of left breast: Secondary | ICD-10-CM | POA: Diagnosis not present

## 2018-06-22 DIAGNOSIS — Z4689 Encounter for fitting and adjustment of other specified devices: Secondary | ICD-10-CM | POA: Diagnosis not present

## 2018-07-05 ENCOUNTER — Telehealth: Payer: BC Managed Care – PPO | Admitting: Cardiology

## 2018-07-05 DIAGNOSIS — E538 Deficiency of other specified B group vitamins: Secondary | ICD-10-CM | POA: Diagnosis not present

## 2018-07-17 NOTE — Progress Notes (Deleted)
Cardiology Office Note:    Date:  07/17/2018   ID:  Marilyn Robinson, DOB [birth year], MRN [id number]  PCP:  Myrlene Broker, MD  Cardiologist:  Shirlee More, MD    Referring MD: Myrlene Broker, MD    ASSESSMENT:    No diagnosis found. PLAN:    In order of problems listed above:  1. ***   Next appointment: ***   Medication Adjustments/Labs and Tests Ordered: Current medicines are reviewed at length with the patient today.  Concerns regarding medicines are outlined above.  No orders of the defined types were placed in this encounter.  No orders of the defined types were placed in this encounter.   No chief complaint on file.   History of Present Illness:    Marilyn Robinson is a 85 y.o. female with a hx of CHF, CAD, Dyslipidemia, HTN and symptomatic hyponatremia and a lacunar stroke  last seen 05/03/2018 a virtual visit.. Compliance with diet, lifestyle and medications: *** Past Medical History:  Diagnosis Date  . Anemia 07/02/2016  . Bilateral carotid artery stenosis 09/22/2015  . BMI 31.0-31.9,adult 05/06/2015  . Breast cancer ([place])   . Chronic combined systolic and diastolic heart failure ([place]) 07/17/2014   Overview:  EF 56% 09/23/10  . Diabetes mellitus without complication ([place])   . Dislocation of right knee with lateral meniscus tear 03/15/2014  . Ductal carcinoma in situ (DCIS) of left breast 05/06/2015  . GERD (gastroesophageal reflux disease)   . Hyperlipidemia 07/17/2014  . Hypertension   . Hypertensive heart disease with heart failure ([place]) 07/17/2014  . Hypertensive urgency 09/22/2015  . Hypokalemia 09/22/2015  . Hyponatremia 05/29/2015  . Mild CAD 07/17/2014   Overview:  50% RCA stenosis in 2005  . Non morbid obesity 05/06/2015  . Pyuria 09/22/2015  . Right knee pain 05/17/2014  . S/P arthroscopy of knee 05/17/2014  . Stroke-like episode ([place]) 05/29/2015  . Stroke-like symptoms 05/29/2015  . TIA (transient ischemic attack)   . Uterine prolapse 07/02/2016     Past Surgical History:  Procedure Laterality Date  . BREAST BIOPSY    . CATARACT EXTRACTION    . CHOLECYSTECTOMY    . CORONARY ANGIOPLASTY WITH STENT PLACEMENT    . EYE SURGERY    . TUBAL LIGATION      Current Medications: No outpatient medications have been marked as taking for the 07/18/18 encounter (Appointment) with Richardo Priest, MD.     Allergies:   Antihistamines, diphenhydramine-type; Lipitor [atorvastatin]; Penicillins; Allopurinol; Aspirin; and Fenofibrate   Social History   Socioeconomic History  . Marital status: Married    Spouse name: Not on file  . Number of children: Not on file  . Years of education: Not on file  . Highest education level: Not on file  Occupational History  . Not on file  Social Needs  . Financial resource strain: Not on file  . Food insecurity    Worry: Not on file    Inability: Not on file  . Transportation needs    Medical: Not on file    Non-medical: Not on file  Tobacco Use  . Smoking status: Former Research scientist (life sciences)  . Smokeless tobacco: Never Used  Substance and Sexual Activity  . Alcohol use: No  . Drug use: No  . Sexual activity: Not on file  Lifestyle  . Physical activity    Days per week: Not on file    Minutes per session: Not on file  . Stress: Not on file  Relationships  . Social Herbalist on phone: Not on file    Gets together: Not on file    Attends religious service: Not on file    Active member of club or organization: Not on file    Attends meetings of clubs or organizations: Not on file    Relationship status: Not on file  Other Topics Concern  . Not on file  Social History Narrative  . Not on file     Family History: The patient's ***family history includes CAD in her father and mother; Diabetes in her father and mother; Stroke in her mother. ROS:   Please see the history of present illness.    All other systems reviewed and are negative.  EKGs/Labs/Other Studies Reviewed:    The following  studies were reviewed today:  EKG:  EKG ordered today and personally reviewed.  The ekg ordered today demonstrates ***  Recent Labs: No results found for requested labs within last 8760 hours.  Recent Lipid Panel    Component Value Date/Time   CHOL 119 08/19/2016 1042   TRIG 107 08/19/2016 1042   HDL 41 08/19/2016 1042   CHOLHDL 3.3 05/29/2015 1549   VLDL 22 05/29/2015 1549   LDLCALC 57 08/19/2016 1042    Physical Exam:    VS:  There were no vitals taken for this visit.    Wt Readings from Last 3 Encounters:  05/03/18 183 lb 6.4 oz (83.2 kg)  11/04/17 186 lb (84.4 kg)  11/25/16 190 lb (86.2 kg)     GEN: *** Well nourished, well developed in no acute distress HEENT: Normal NECK: No JVD; No carotid bruits LYMPHATICS: No lymphadenopathy CARDIAC: ***RRR, no murmurs, rubs, gallops RESPIRATORY:  Clear to auscultation without rales, wheezing or rhonchi  ABDOMEN: Soft, non-tender, non-distended MUSCULOSKELETAL:  No edema; No deformity  SKIN: Warm and dry NEUROLOGIC:  Alert and oriented x 3 PSYCHIATRIC:  Normal affect    Signed, Shirlee More, MD  07/17/2018 12:23 PM    [facility]

## 2018-07-18 ENCOUNTER — Ambulatory Visit (INDEPENDENT_AMBULATORY_CARE_PROVIDER_SITE_OTHER): Payer: PPO | Admitting: Cardiology

## 2018-07-18 ENCOUNTER — Encounter: Payer: Self-pay | Admitting: Cardiology

## 2018-07-18 ENCOUNTER — Ambulatory Visit: Payer: PPO | Admitting: Cardiology

## 2018-07-18 ENCOUNTER — Other Ambulatory Visit: Payer: Self-pay

## 2018-07-18 VITALS — BP 138/80 | HR 68 | Ht 66.5 in | Wt 183.6 lb

## 2018-07-18 DIAGNOSIS — I110 Hypertensive heart disease with heart failure: Secondary | ICD-10-CM

## 2018-07-18 DIAGNOSIS — E118 Type 2 diabetes mellitus with unspecified complications: Secondary | ICD-10-CM | POA: Diagnosis not present

## 2018-07-18 DIAGNOSIS — I5042 Chronic combined systolic (congestive) and diastolic (congestive) heart failure: Secondary | ICD-10-CM

## 2018-07-18 DIAGNOSIS — I2510 Atherosclerotic heart disease of native coronary artery without angina pectoris: Secondary | ICD-10-CM | POA: Diagnosis not present

## 2018-07-18 DIAGNOSIS — E871 Hypo-osmolality and hyponatremia: Secondary | ICD-10-CM | POA: Diagnosis not present

## 2018-07-18 DIAGNOSIS — E782 Mixed hyperlipidemia: Secondary | ICD-10-CM

## 2018-07-18 NOTE — Progress Notes (Signed)
Cardiology Office Note:    Date:  07/18/2018   ID:  Marilyn Robinson, DOB 06/17/1933, MRN [id number]  PCP:  Myrlene Broker, MD  Cardiologist:  Shirlee More, MD    Referring MD: Myrlene Broker, MD    ASSESSMENT:    1. Chronic combined systolic and diastolic heart failure ([place])   2. Hypertensive heart disease with heart failure ([place])   3. Mild CAD   4. Mixed hyperlipidemia   5. Hyponatremia    PLAN:    In order of problems listed above:  1. Heart failure is compensated New [place] Heart Association class I continue current medical treatment including carvedilol ARB hydralazine and loop diuretic.  She has no volume overload.  Recheck renal function proBNP level continue sodium restriction water restriction and home self-management Daily weights 2. Hypertension stable repeat blood pressure at target Home blood pressures consistently less than 782 systolic 3. Stable CAD New [place] Heart Association class I continue medical treatment including clopidogrel high intensity statin beta-blocker 4. Stable dyslipidemia continue her current high intensity statin high risk with CAD 5. Continue water restriction 2 to 3 L/day recheck serum sodium today   Next appointment: 6 months   Medication Adjustments/Labs and Tests Ordered: Current medicines are reviewed at length with the patient today.  Concerns regarding medicines are outlined above.  No orders of the defined types were placed in this encounter.  No orders of the defined types were placed in this encounter.   Chief Complaint  Patient presents with  . Follow-up  . Congestive Heart Failure  . Coronary Artery Disease  . Hypertension    History of Present Illness:    Marilyn Robinson is a 85 y.o. female with a hx ofCHF, CAD, Dyslipidemia, HTN and symptomatic hyponatremia   and a lacunar stroke last seen 05/03/2018. Compliance with diet, lifestyle and medications: Yes  Overall she is doing well she is careful with her exposures  in the community wears a mask practices distancing and good handwashing technique.  She has not had edema shortness of breath chest pain palpitation or syncope.  Recently seen her PCP office for vitamin B12.  She is taking Remeron again and is able to sleep. Past Medical History:  Diagnosis Date  . Anemia 07/02/2016  . Bilateral carotid artery stenosis 09/22/2015  . BMI 31.0-31.9,adult 05/06/2015  . Breast cancer ([place])   . Chronic combined systolic and diastolic heart failure ([place]) 07/17/2014   Overview:  EF 56% 09/23/10  . Diabetes mellitus without complication ([place])   . Dislocation of right knee with lateral meniscus tear 03/15/2014  . Ductal carcinoma in situ (DCIS) of left breast 05/06/2015  . GERD (gastroesophageal reflux disease)   . Hyperlipidemia 07/17/2014  . Hypertension   . Hypertensive heart disease with heart failure ([place]) 07/17/2014  . Hypertensive urgency 09/22/2015  . Hypokalemia 09/22/2015  . Hyponatremia 05/29/2015  . Mild CAD 07/17/2014   Overview:  50% RCA stenosis in 2005  . Non morbid obesity 05/06/2015  . Pyuria 09/22/2015  . Right knee pain 05/17/2014  . S/P arthroscopy of knee 05/17/2014  . Stroke-like episode ([place]) 05/29/2015  . Stroke-like symptoms 05/29/2015  . TIA (transient ischemic attack)   . Uterine prolapse 07/02/2016    Past Surgical History:  Procedure Laterality Date  . BREAST BIOPSY    . CATARACT EXTRACTION    . CHOLECYSTECTOMY    . CORONARY ANGIOPLASTY WITH STENT PLACEMENT    . EYE SURGERY    . TUBAL LIGATION  Current Medications: Current Meds  Medication Sig  . acetaminophen (TYLENOL) 500 MG tablet Take 500 mg by mouth every 6 (six) hours as needed for mild pain.  . carvedilol (COREG) 12.5 MG tablet Take 12.5 mg by mouth 2 (two) times daily.  . carvedilol (COREG) 25 MG tablet Take 25 mg by mouth 2 (two) times daily.   . clopidogrel (PLAVIX) 75 MG tablet Take 75 mg by mouth every morning.  . cyanocobalamin (,VITAMIN B-12,) 1000 MCG/ML injection Inject  1 mL into the muscle every 30 (thirty) days.  . furosemide (LASIX) 20 MG tablet Take 20 mg by mouth daily as needed for fluid or edema.   . hydrALAZINE (APRESOLINE) 25 MG tablet Take 25 mg 3 (three) times daily by mouth. BP > 165 take an extra 1/2 of hydralazine   . metFORMIN (GLUCOPHAGE-XR) 500 MG 24 hr tablet Take 500 mg by mouth daily with breakfast. 500 mg in the morning and 1,000 at night  . mirtazapine (REMERON) 15 MG tablet Take 15 mg by mouth daily.   . pantoprazole (PROTONIX) 40 MG tablet Take 40 mg by mouth daily.  . rosuvastatin (CRESTOR) 10 MG tablet Take 10 mg by mouth every other day.  . valsartan (DIOVAN) 160 MG tablet Take 160 mg by mouth 2 (two) times a day.      Allergies:   Antihistamines, diphenhydramine-type; Lipitor [atorvastatin]; Penicillins; Allopurinol; Aspirin; and Fenofibrate   Social History   Socioeconomic History  . Marital status: Married    Spouse name: Not on file  . Number of children: Not on file  . Years of education: Not on file  . Highest education level: Not on file  Occupational History  . Not on file  Social Needs  . Financial resource strain: Not on file  . Food insecurity    Worry: Not on file    Inability: Not on file  . Transportation needs    Medical: Not on file    Non-medical: Not on file  Tobacco Use  . Smoking status: Former Research scientist (life sciences)  . Smokeless tobacco: Never Used  Substance and Sexual Activity  . Alcohol use: No  . Drug use: No  . Sexual activity: Not on file  Lifestyle  . Physical activity    Days per week: Not on file    Minutes per session: Not on file  . Stress: Not on file  Relationships  . Social Herbalist on phone: Not on file    Gets together: Not on file    Attends religious service: Not on file    Active member of club or organization: Not on file    Attends meetings of clubs or organizations: Not on file    Relationship status: Not on file  Other Topics Concern  . Not on file  Social History  Narrative  . Not on file     Family History: The patient's family history includes CAD in her father and mother; Diabetes in her father and mother; Stroke in her mother. ROS:   Please see the history of present illness.    All other systems reviewed and are negative.  EKGs/Labs/Other Studies Reviewed:    The following studies were reviewed today  Recent Labs 04/14/2018 A1c 6.8% sodium 132 potassium 5.5 creatinine 1.01 08/25/2017 LDL cholesterol 77   Physical Exam:    VS:  BP (!) 184/82 (BP Location: Left Arm, Patient Position: Sitting, Cuff Size: Normal)   Pulse 68   Ht 5' 6.5" (1.689  m)   Wt 183 lb 9.6 oz (83.3 kg)   SpO2 98%   BMI 29.19 kg/m     Wt Readings from Last 3 Encounters:  07/18/18 183 lb 9.6 oz (83.3 kg)  05/03/18 183 lb 6.4 oz (83.2 kg)  11/04/17 186 lb (84.4 kg)     GEN:  Well nourished, well developed in no acute distress HEENT: Normal NECK: No JVD; No carotid bruits LYMPHATICS: No lymphadenopathy CARDIAC: RRR, no murmurs, rubs, gallops RESPIRATORY:  Clear to auscultation without rales, wheezing or rhonchi  ABDOMEN: Soft, non-tender, non-distended MUSCULOSKELETAL:  No edema; No deformity  SKIN: Warm and dry NEUROLOGIC:  Alert and oriented x 3 PSYCHIATRIC:  Normal affect    Signed, Shirlee More, MD  07/18/2018 11:34 AM    [place]

## 2018-07-18 NOTE — Addendum Note (Signed)
Addended by: Austin Miles on: 07/18/2018 11:42 AM   Modules accepted: Orders

## 2018-07-18 NOTE — Patient Instructions (Addendum)
Medication Instructions:  Your physician recommends that you continue on your current medications as directed. Please refer to the Current Medication list given to you today.  If you need a refill on your cardiac medications before your next appointment, please call your pharmacy.   Lab work: Your physician recommends that you return for lab work today: BMP, ProBNP, Hemoglobin A1C.   If you have labs (blood work) drawn today and your tests are completely normal, you will receive your results only by: Marland Kitchen MyChart Message (if you have MyChart) OR . A paper copy in the mail If you have any lab test that is abnormal or we need to change your treatment, we will call you to review the results.  Testing/Procedures: None  Follow-Up: At [facility], you and your health needs are our priority.  As part of our continuing mission to provide you with exceptional heart care, we have created designated Provider Care Teams.  These Care Teams include your primary Cardiologist (physician) and Advanced Practice Providers (APPs -  Physician Assistants and Nurse Practitioners) who all work together to provide you with the care you need, when you need it. You will need a follow up appointment in 6 months.  Please call our office 2 months in advance to schedule this appointment.

## 2018-07-21 ENCOUNTER — Telehealth: Payer: Self-pay

## 2018-07-21 LAB — BASIC METABOLIC PANEL
BUN/Creatinine Ratio: 18 (ref 12–28)
BUN: 18 mg/dL (ref 8–27)
CO2: 22 mmol/L (ref 20–29)
Calcium: 9.2 mg/dL (ref 8.7–10.3)
Chloride: 98 mmol/L (ref 96–106)
Creatinine, Ser: 0.98 mg/dL (ref 0.57–1.00)
GFR calc Af Amer: 62 mL/min/{1.73_m2} (ref >59)
GFR calc non Af Amer: 54 mL/min/{1.73_m2} — ABNORMAL LOW (ref >59)
Glucose: 171 mg/dL — ABNORMAL HIGH (ref 65–99)
Potassium: 5.4 mmol/L — ABNORMAL HIGH (ref 3.5–5.2)
Sodium: 133 mmol/L — ABNORMAL LOW (ref 134–144)

## 2018-07-21 LAB — HEMOGLOBIN A1C
Est. average glucose Bld gHb Est-mCnc: 148 mg/dL
Hgb A1c MFr Bld: 6.8 % — ABNORMAL HIGH (ref 4.8–5.6)

## 2018-07-21 LAB — PRO B NATRIURETIC PEPTIDE: NT-Pro BNP: 493 pg/mL (ref 0–738)

## 2018-07-21 NOTE — Telephone Encounter (Signed)
-----   Message from Richardo Priest, MD sent at 07/21/2018  7:37 AM EDT ----- Normal or stable result  No changes

## 2018-07-21 NOTE — Telephone Encounter (Signed)
Patient called and notified of lab results. 

## 2018-09-29 DIAGNOSIS — C50819 Malignant neoplasm of overlapping sites of unspecified female breast: Secondary | ICD-10-CM | POA: Diagnosis not present

## 2018-09-29 DIAGNOSIS — C50219 Malignant neoplasm of upper-inner quadrant of unspecified female breast: Secondary | ICD-10-CM | POA: Diagnosis not present

## 2018-09-29 DIAGNOSIS — Z170 Estrogen receptor positive status [ER+]: Secondary | ICD-10-CM | POA: Diagnosis not present

## 2018-09-29 DIAGNOSIS — Z86000 Personal history of in-situ neoplasm of breast: Secondary | ICD-10-CM | POA: Diagnosis not present

## 2018-09-29 DIAGNOSIS — D0512 Intraductal carcinoma in situ of left breast: Secondary | ICD-10-CM | POA: Diagnosis not present

## 2018-10-13 DIAGNOSIS — J329 Chronic sinusitis, unspecified: Secondary | ICD-10-CM | POA: Diagnosis not present

## 2018-10-13 DIAGNOSIS — K219 Gastro-esophageal reflux disease without esophagitis: Secondary | ICD-10-CM | POA: Diagnosis not present

## 2018-10-13 DIAGNOSIS — E538 Deficiency of other specified B group vitamins: Secondary | ICD-10-CM | POA: Diagnosis not present

## 2018-10-13 DIAGNOSIS — E1165 Type 2 diabetes mellitus with hyperglycemia: Secondary | ICD-10-CM | POA: Diagnosis not present

## 2018-10-13 DIAGNOSIS — I10 Essential (primary) hypertension: Secondary | ICD-10-CM | POA: Diagnosis not present

## 2018-10-13 DIAGNOSIS — J310 Chronic rhinitis: Secondary | ICD-10-CM | POA: Diagnosis not present

## 2018-10-13 DIAGNOSIS — F329 Major depressive disorder, single episode, unspecified: Secondary | ICD-10-CM | POA: Diagnosis not present

## 2018-10-13 DIAGNOSIS — F419 Anxiety disorder, unspecified: Secondary | ICD-10-CM | POA: Diagnosis not present

## 2018-10-13 DIAGNOSIS — E782 Mixed hyperlipidemia: Secondary | ICD-10-CM | POA: Diagnosis not present

## 2018-10-13 DIAGNOSIS — Z0000 Encounter for general adult medical examination without abnormal findings: Secondary | ICD-10-CM | POA: Diagnosis not present

## 2018-10-14 DIAGNOSIS — J310 Chronic rhinitis: Secondary | ICD-10-CM | POA: Insufficient documentation

## 2018-10-14 DIAGNOSIS — J329 Chronic sinusitis, unspecified: Secondary | ICD-10-CM

## 2018-10-14 HISTORY — DX: Chronic sinusitis, unspecified: J32.9

## 2018-10-15 DIAGNOSIS — Z23 Encounter for immunization: Secondary | ICD-10-CM | POA: Diagnosis not present

## 2018-10-17 DIAGNOSIS — Z4689 Encounter for fitting and adjustment of other specified devices: Secondary | ICD-10-CM | POA: Diagnosis not present

## 2019-01-23 DIAGNOSIS — Z4689 Encounter for fitting and adjustment of other specified devices: Secondary | ICD-10-CM | POA: Diagnosis not present

## 2019-03-22 DIAGNOSIS — R05 Cough: Secondary | ICD-10-CM | POA: Diagnosis not present

## 2019-03-22 DIAGNOSIS — I5042 Chronic combined systolic (congestive) and diastolic (congestive) heart failure: Secondary | ICD-10-CM | POA: Diagnosis not present

## 2019-03-22 DIAGNOSIS — J310 Chronic rhinitis: Secondary | ICD-10-CM | POA: Diagnosis not present

## 2019-03-22 DIAGNOSIS — J40 Bronchitis, not specified as acute or chronic: Secondary | ICD-10-CM | POA: Diagnosis not present

## 2019-03-22 DIAGNOSIS — J329 Chronic sinusitis, unspecified: Secondary | ICD-10-CM | POA: Diagnosis not present

## 2019-03-24 DIAGNOSIS — H52223 Regular astigmatism, bilateral: Secondary | ICD-10-CM | POA: Diagnosis not present

## 2019-03-24 DIAGNOSIS — H35033 Hypertensive retinopathy, bilateral: Secondary | ICD-10-CM | POA: Diagnosis not present

## 2019-03-24 DIAGNOSIS — H43813 Vitreous degeneration, bilateral: Secondary | ICD-10-CM | POA: Diagnosis not present

## 2019-03-24 DIAGNOSIS — E113211 Type 2 diabetes mellitus with mild nonproliferative diabetic retinopathy with macular edema, right eye: Secondary | ICD-10-CM | POA: Diagnosis not present

## 2019-03-24 DIAGNOSIS — H524 Presbyopia: Secondary | ICD-10-CM | POA: Diagnosis not present

## 2019-03-24 DIAGNOSIS — E113292 Type 2 diabetes mellitus with mild nonproliferative diabetic retinopathy without macular edema, left eye: Secondary | ICD-10-CM | POA: Diagnosis not present

## 2019-03-24 DIAGNOSIS — H04123 Dry eye syndrome of bilateral lacrimal glands: Secondary | ICD-10-CM | POA: Diagnosis not present

## 2019-04-03 NOTE — Progress Notes (Signed)
Cardiology Office Note:    Date:  04/04/2019   ID:  Marilyn Robinson, DOB 06-Jan-1934, MRN [id number]  PCP:  Myrlene Broker, MD  Cardiologist:  Shirlee More, MD    Referring MD: Myrlene Broker, MD    ASSESSMENT:    1. Chronic combined systolic and diastolic heart failure ([place])   2. Hypertensive heart disease with heart failure ([place])   3. Hyponatremia   4. Mild CAD    PLAN:    In order of problems listed above:  1. She has done quite well her heart failure is compensated she takes as needed diuretics because of problems with severe symptomatic hyponatremia and hypotension in the past Heart Association class I continue treatment with intermittent loop diuretic beta-blocker ARB.  Should have labs done at her PCPs office including electrolytes in the next 2 weeks 2. As opposed to additional antihypertensive agents and challenged her to check her blood pressure at home contact me if she remains greater than [id number] systolic and bring numbers at her next office visit.  Continue current treatment ARB beta-blocker 3. Follow-up labs in the next 2 weeks with her PCP she will continue use a diuretic as needed to avoid hyponatremia 4. CAD is stable having no anginal discomfort continue antiplatelet therapy clopidogrel beta-blocker and high intensity statin.  Last lipid profile 08/25/2017 cholesterol 130 HDL 40 triglycerides 131 LabsNext 1 to 2 weeks from her PCP office.  If LDL greater than 100 require coincident 70   Next appointment: Months   Medication Adjustments/Labs and Tests Ordered: Current medicines are reviewed at length with the patient today.  Concerns regarding medicines are outlined above.  No orders of the defined types were placed in this encounter.  Meds ordered this encounter  Medications  . furosemide (LASIX) 20 MG tablet    Sig: Take 1 tablet (20 mg total) by mouth daily as needed for fluid or edema.    Dispense:  30 tablet    Refill:  2    No chief complaint on  file.   History of Present Illness:    Marilyn Robinson is a 86 y.o. female with a hx of CHF, CAD, Dyslipidemia, HTN and symptomatic hyponatremia  and a lacunar stroke last seen 07/18/2018.  That time she was doing well her heart failure was compensated blood pressure at target stable CAD and dyslipidemia. Compliance with diet, lifestyle and medications: Yes  In general she is doing well she had one episode of dietary discretion weight gain of 7 pounds resolved with single dose of furosemide.  Not checking home blood pressure today repeat by me 150/60 as opposed to additional antihypertensives to monitor home blood pressure several times a week contact me if she remains out of range.  She has had no recent edema shortness of breath chest pain palpitation or syncope.  I was going to check her electrolytes every 2 hours she tells me she has a visit with her PCP in the next 2 weeks.  Recent proBNP level was not elevated in the heart failure range.   Past Medical History:  Diagnosis Date  . Anemia 07/02/2016  . Bilateral carotid artery stenosis 09/22/2015  . BMI 31.0-31.9,adult 05/06/2015  . Breast cancer ([place])   . Chronic combined systolic and diastolic heart failure ([place]) 07/17/2014   Overview:  EF 56% 09/23/10  . Diabetes mellitus without complication ([place])   . Dislocation of right knee with lateral meniscus tear 03/15/2014  . Ductal carcinoma in situ (DCIS) of  left breast 05/06/2015  . GERD (gastroesophageal reflux disease)   . Hyperlipidemia 07/17/2014  . Hypertension   . Hypertensive heart disease with heart failure ([place]) 07/17/2014  . Hypertensive urgency 09/22/2015  . Hypokalemia 09/22/2015  . Hyponatremia 05/29/2015  . Mild CAD 07/17/2014   Overview:  50% RCA stenosis in 2005  . Non morbid obesity 05/06/2015  . Pyuria 09/22/2015  . Right knee pain 05/17/2014  . S/P arthroscopy of knee 05/17/2014  . Stroke-like episode 05/29/2015  . Stroke-like symptoms 05/29/2015  . TIA (transient ischemic attack)     . Uterine prolapse 07/02/2016    Past Surgical History:  Procedure Laterality Date  . BREAST BIOPSY    . CATARACT EXTRACTION    . CHOLECYSTECTOMY    . CORONARY ANGIOPLASTY WITH STENT PLACEMENT    . EYE SURGERY    . TUBAL LIGATION      Current Medications: Current Meds  Medication Sig  . acetaminophen (TYLENOL) 500 MG tablet Take 500 mg by mouth every 6 (six) hours as needed for mild pain.  . carvedilol (COREG) 12.5 MG tablet Take 12.5 mg by mouth 2 (two) times daily.  . carvedilol (COREG) 25 MG tablet Take 25 mg by mouth 2 (two) times daily.   . clopidogrel (PLAVIX) 75 MG tablet Take 75 mg by mouth every morning.  . cyanocobalamin (,VITAMIN B-12,) 1000 MCG/ML injection Inject 1 mL into the muscle every 30 (thirty) days.  . furosemide (LASIX) 20 MG tablet Take 1 tablet (20 mg total) by mouth daily as needed for fluid or edema.  . hydrALAZINE (APRESOLINE) 25 MG tablet Take 25 mg 3 (three) times daily by mouth. BP > 165 take an extra 1/2 of hydralazine   . metFORMIN (GLUCOPHAGE-XR) 500 MG 24 hr tablet Take 500 mg by mouth daily with breakfast. 500 mg in the morning and 1,000 at night  . mirtazapine (REMERON) 15 MG tablet Take 15 mg by mouth daily.   Marland Kitchen olmesartan (BENICAR) 40 MG tablet Take 1 tablet by mouth daily.  . pantoprazole (PROTONIX) 40 MG tablet Take 40 mg by mouth daily.  . rosuvastatin (CRESTOR) 10 MG tablet Take 10 mg by mouth every other day.  . [DISCONTINUED] furosemide (LASIX) 20 MG tablet Take 20 mg by mouth daily as needed for fluid or edema.      Allergies:   Antihistamines, diphenhydramine-type; Lipitor [atorvastatin]; Penicillins; Allopurinol; Aspirin; and Fenofibrate   Social History   Socioeconomic History  . Marital status: Married    Spouse name: Not on file  . Number of children: Not on file  . Years of education: Not on file  . Highest education level: Not on file  Occupational History  . Not on file  Tobacco Use  . Smoking status: Former Research scientist (life sciences)  .  Smokeless tobacco: Never Used  Substance and Sexual Activity  . Alcohol use: No  . Drug use: No  . Sexual activity: Not on file  Other Topics Concern  . Not on file  Social History Narrative  . Not on file   Social Determinants of Health   Financial Resource Strain:   . Difficulty of Paying Living Expenses:   Food Insecurity:   . Worried About Charity fundraiser in the Last Year:   . Arboriculturist in the Last Year:   Transportation Needs:   . Film/video editor (Medical):   Marland Kitchen Lack of Transportation (Non-Medical):   Physical Activity:   . Days of Exercise per Week:   .  Minutes of Exercise per Session:   Stress:   . Feeling of Stress :   Social Connections:   . Frequency of Communication with Friends and Family:   . Frequency of Social Gatherings with Friends and Family:   . Attends Religious Services:   . Active Member of Clubs or Organizations:   . Attends Archivist Meetings:   Marland Kitchen Marital Status:      Family History: The patient's family history includes CAD in her father and mother; Diabetes in her father and mother; Stroke in her mother. ROS:   Please see the history of present illness.    All other systems reviewed and are negative.  EKGs/Labs/Other Studies Reviewed:    The following studies were reviewed today:  EKG:  EKG ordered today and personally reviewed.  The ekg ordered today demonstrates sinus rhythm and is normal  Recent Labs:  03/22/2019: BNP 475 07/18/2018: BUN 18; Creatinine, Ser 0.98; NT-Pro BNP 493; Potassium 5.4; Sodium 133  Recent Lipid Panel    Component Value Date/Time   CHOL 119 08/19/2016 1042   TRIG 107 08/19/2016 1042   HDL 41 08/19/2016 1042   CHOLHDL 3.3 05/29/2015 1549   VLDL 22 05/29/2015 1549   LDLCALC 57 08/19/2016 1042    Physical Exam:    VS:  BP (!) 172/80   Pulse 63   Resp (!) 98   Ht 5' 6.5" (1.689 m)   Wt 178 lb (80.7 kg)   BMI 28.30 kg/m     Wt Readings from Last 3 Encounters:  04/04/19 178  lb (80.7 kg)  07/18/18 183 lb 9.6 oz (83.3 kg)  05/03/18 183 lb 6.4 oz (83.2 kg)     GEN:  Well nourished, well developed in no acute distress HEENT: Normal NECK: No JVD; No carotid bruits LYMPHATICS: No lymphadenopathy CARDIAC: RRR, no murmurs, rubs, gallops RESPIRATORY:  Clear to auscultation without rales, wheezing or rhonchi  ABDOMEN: Soft, non-tender, non-distended MUSCULOSKELETAL:  No edema; No deformity  SKIN: Warm and dry NEUROLOGIC:  Alert and oriented x 3 PSYCHIATRIC:  Normal affect    Signed, Shirlee More, MD  04/04/2019 4:45 PM    [facility]

## 2019-04-04 ENCOUNTER — Encounter: Payer: Self-pay | Admitting: Cardiology

## 2019-04-04 ENCOUNTER — Other Ambulatory Visit: Payer: Self-pay

## 2019-04-04 ENCOUNTER — Ambulatory Visit (INDEPENDENT_AMBULATORY_CARE_PROVIDER_SITE_OTHER): Payer: PPO | Admitting: Cardiology

## 2019-04-04 VITALS — BP 172/80 | HR 63 | Resp 98 | Ht 66.5 in | Wt 178.0 lb

## 2019-04-04 DIAGNOSIS — I5042 Chronic combined systolic (congestive) and diastolic (congestive) heart failure: Secondary | ICD-10-CM

## 2019-04-04 DIAGNOSIS — I110 Hypertensive heart disease with heart failure: Secondary | ICD-10-CM | POA: Diagnosis not present

## 2019-04-04 DIAGNOSIS — E871 Hypo-osmolality and hyponatremia: Secondary | ICD-10-CM | POA: Diagnosis not present

## 2019-04-04 DIAGNOSIS — I2510 Atherosclerotic heart disease of native coronary artery without angina pectoris: Secondary | ICD-10-CM

## 2019-04-04 MED ORDER — FUROSEMIDE 20 MG PO TABS: 20.0000 mg | ORAL_TABLET | Freq: Every day | ORAL | 2 refills | Status: DC | PRN

## 2019-04-04 NOTE — Patient Instructions (Signed)

## 2019-04-05 NOTE — Addendum Note (Signed)
Addended by: Sandralee Tarkington, Jonelle Sidle L on: 04/05/2019 01:36 PM   Modules accepted: Orders

## 2019-04-07 DIAGNOSIS — E113211 Type 2 diabetes mellitus with mild nonproliferative diabetic retinopathy with macular edema, right eye: Secondary | ICD-10-CM | POA: Diagnosis not present

## 2019-04-07 DIAGNOSIS — A449 Bartonellosis, unspecified: Secondary | ICD-10-CM | POA: Diagnosis not present

## 2019-04-07 DIAGNOSIS — H43812 Vitreous degeneration, left eye: Secondary | ICD-10-CM | POA: Diagnosis not present

## 2019-04-07 DIAGNOSIS — H3090 Unspecified chorioretinal inflammation, unspecified eye: Secondary | ICD-10-CM | POA: Diagnosis not present

## 2019-04-18 DIAGNOSIS — E782 Mixed hyperlipidemia: Secondary | ICD-10-CM | POA: Diagnosis not present

## 2019-04-18 DIAGNOSIS — K219 Gastro-esophageal reflux disease without esophagitis: Secondary | ICD-10-CM | POA: Diagnosis not present

## 2019-04-18 DIAGNOSIS — I10 Essential (primary) hypertension: Secondary | ICD-10-CM | POA: Diagnosis not present

## 2019-04-18 DIAGNOSIS — I110 Hypertensive heart disease with heart failure: Secondary | ICD-10-CM | POA: Diagnosis not present

## 2019-04-18 DIAGNOSIS — E1165 Type 2 diabetes mellitus with hyperglycemia: Secondary | ICD-10-CM | POA: Diagnosis not present

## 2019-04-18 DIAGNOSIS — F3342 Major depressive disorder, recurrent, in full remission: Secondary | ICD-10-CM | POA: Diagnosis not present

## 2019-05-01 DIAGNOSIS — N814 Uterovaginal prolapse, unspecified: Secondary | ICD-10-CM | POA: Diagnosis not present

## 2019-05-19 DIAGNOSIS — Z1231 Encounter for screening mammogram for malignant neoplasm of breast: Secondary | ICD-10-CM | POA: Diagnosis not present

## 2019-05-29 DIAGNOSIS — D0512 Intraductal carcinoma in situ of left breast: Secondary | ICD-10-CM | POA: Diagnosis not present

## 2019-06-13 DIAGNOSIS — R5381 Other malaise: Secondary | ICD-10-CM | POA: Insufficient documentation

## 2019-06-13 DIAGNOSIS — R600 Localized edema: Secondary | ICD-10-CM | POA: Diagnosis not present

## 2019-06-13 DIAGNOSIS — R262 Difficulty in walking, not elsewhere classified: Secondary | ICD-10-CM

## 2019-06-13 DIAGNOSIS — R5383 Other fatigue: Secondary | ICD-10-CM | POA: Diagnosis not present

## 2019-06-13 DIAGNOSIS — R609 Edema, unspecified: Secondary | ICD-10-CM | POA: Diagnosis not present

## 2019-06-13 HISTORY — DX: Other fatigue: R53.81

## 2019-06-13 HISTORY — DX: Difficulty in walking, not elsewhere classified: R26.2

## 2019-06-18 DIAGNOSIS — I110 Hypertensive heart disease with heart failure: Secondary | ICD-10-CM | POA: Diagnosis not present

## 2019-06-18 DIAGNOSIS — M109 Gout, unspecified: Secondary | ICD-10-CM | POA: Diagnosis not present

## 2019-06-18 DIAGNOSIS — N390 Urinary tract infection, site not specified: Secondary | ICD-10-CM | POA: Diagnosis not present

## 2019-06-18 DIAGNOSIS — R0689 Other abnormalities of breathing: Secondary | ICD-10-CM | POA: Diagnosis not present

## 2019-06-18 DIAGNOSIS — I5033 Acute on chronic diastolic (congestive) heart failure: Secondary | ICD-10-CM | POA: Diagnosis not present

## 2019-06-18 DIAGNOSIS — R531 Weakness: Secondary | ICD-10-CM | POA: Diagnosis not present

## 2019-06-18 DIAGNOSIS — D62 Acute posthemorrhagic anemia: Secondary | ICD-10-CM | POA: Diagnosis not present

## 2019-06-18 DIAGNOSIS — Z7409 Other reduced mobility: Secondary | ICD-10-CM | POA: Diagnosis not present

## 2019-06-18 DIAGNOSIS — F339 Major depressive disorder, recurrent, unspecified: Secondary | ICD-10-CM | POA: Diagnosis not present

## 2019-06-18 DIAGNOSIS — I509 Heart failure, unspecified: Secondary | ICD-10-CM | POA: Diagnosis not present

## 2019-06-18 DIAGNOSIS — H9190 Unspecified hearing loss, unspecified ear: Secondary | ICD-10-CM | POA: Diagnosis not present

## 2019-06-18 DIAGNOSIS — E119 Type 2 diabetes mellitus without complications: Secondary | ICD-10-CM | POA: Diagnosis not present

## 2019-06-18 DIAGNOSIS — R600 Localized edema: Secondary | ICD-10-CM | POA: Diagnosis not present

## 2019-06-18 DIAGNOSIS — D649 Anemia, unspecified: Secondary | ICD-10-CM | POA: Diagnosis not present

## 2019-06-18 DIAGNOSIS — I420 Dilated cardiomyopathy: Secondary | ICD-10-CM | POA: Diagnosis not present

## 2019-06-18 DIAGNOSIS — Z20822 Contact with and (suspected) exposure to covid-19: Secondary | ICD-10-CM | POA: Diagnosis not present

## 2019-06-18 DIAGNOSIS — Z87891 Personal history of nicotine dependence: Secondary | ICD-10-CM | POA: Diagnosis not present

## 2019-06-18 DIAGNOSIS — K219 Gastro-esophageal reflux disease without esophagitis: Secondary | ICD-10-CM | POA: Diagnosis not present

## 2019-06-18 DIAGNOSIS — K922 Gastrointestinal hemorrhage, unspecified: Secondary | ICD-10-CM | POA: Diagnosis not present

## 2019-06-18 DIAGNOSIS — R5381 Other malaise: Secondary | ICD-10-CM | POA: Diagnosis not present

## 2019-06-18 DIAGNOSIS — R609 Edema, unspecified: Secondary | ICD-10-CM | POA: Diagnosis not present

## 2019-06-18 DIAGNOSIS — Z8719 Personal history of other diseases of the digestive system: Secondary | ICD-10-CM | POA: Diagnosis not present

## 2019-06-18 DIAGNOSIS — K222 Esophageal obstruction: Secondary | ICD-10-CM | POA: Diagnosis not present

## 2019-06-18 DIAGNOSIS — Z1211 Encounter for screening for malignant neoplasm of colon: Secondary | ICD-10-CM | POA: Diagnosis not present

## 2019-06-18 DIAGNOSIS — Z8673 Personal history of transient ischemic attack (TIA), and cerebral infarction without residual deficits: Secondary | ICD-10-CM | POA: Diagnosis not present

## 2019-06-18 DIAGNOSIS — K5790 Diverticulosis of intestine, part unspecified, without perforation or abscess without bleeding: Secondary | ICD-10-CM | POA: Diagnosis not present

## 2019-06-18 DIAGNOSIS — Z853 Personal history of malignant neoplasm of breast: Secondary | ICD-10-CM | POA: Diagnosis not present

## 2019-06-18 DIAGNOSIS — K5730 Diverticulosis of large intestine without perforation or abscess without bleeding: Secondary | ICD-10-CM | POA: Diagnosis not present

## 2019-06-18 DIAGNOSIS — E871 Hypo-osmolality and hyponatremia: Secondary | ICD-10-CM | POA: Diagnosis not present

## 2019-06-18 DIAGNOSIS — I2510 Atherosclerotic heart disease of native coronary artery without angina pectoris: Secondary | ICD-10-CM | POA: Diagnosis not present

## 2019-06-18 DIAGNOSIS — K5721 Diverticulitis of large intestine with perforation and abscess with bleeding: Secondary | ICD-10-CM | POA: Diagnosis not present

## 2019-06-18 DIAGNOSIS — R0600 Dyspnea, unspecified: Secondary | ICD-10-CM | POA: Diagnosis not present

## 2019-06-18 DIAGNOSIS — R2681 Unsteadiness on feet: Secondary | ICD-10-CM | POA: Diagnosis not present

## 2019-06-18 DIAGNOSIS — I10 Essential (primary) hypertension: Secondary | ICD-10-CM | POA: Diagnosis not present

## 2019-06-18 DIAGNOSIS — K635 Polyp of colon: Secondary | ICD-10-CM | POA: Diagnosis not present

## 2019-06-18 DIAGNOSIS — K921 Melena: Secondary | ICD-10-CM | POA: Diagnosis not present

## 2019-06-18 DIAGNOSIS — K5791 Diverticulosis of intestine, part unspecified, without perforation or abscess with bleeding: Secondary | ICD-10-CM | POA: Diagnosis not present

## 2019-06-18 DIAGNOSIS — Z86010 Personal history of colonic polyps: Secondary | ICD-10-CM | POA: Diagnosis not present

## 2019-06-18 DIAGNOSIS — Z886 Allergy status to analgesic agent status: Secondary | ICD-10-CM | POA: Diagnosis not present

## 2019-06-18 DIAGNOSIS — D509 Iron deficiency anemia, unspecified: Secondary | ICD-10-CM | POA: Diagnosis not present

## 2019-06-18 DIAGNOSIS — Z79899 Other long term (current) drug therapy: Secondary | ICD-10-CM | POA: Diagnosis not present

## 2019-06-18 DIAGNOSIS — Z880 Allergy status to penicillin: Secondary | ICD-10-CM | POA: Diagnosis not present

## 2019-06-18 DIAGNOSIS — R262 Difficulty in walking, not elsewhere classified: Secondary | ICD-10-CM | POA: Diagnosis not present

## 2019-06-18 DIAGNOSIS — R1310 Dysphagia, unspecified: Secondary | ICD-10-CM | POA: Diagnosis not present

## 2019-06-23 DIAGNOSIS — D62 Acute posthemorrhagic anemia: Secondary | ICD-10-CM | POA: Diagnosis not present

## 2019-06-23 DIAGNOSIS — I10 Essential (primary) hypertension: Secondary | ICD-10-CM | POA: Diagnosis not present

## 2019-06-23 DIAGNOSIS — Z8719 Personal history of other diseases of the digestive system: Secondary | ICD-10-CM | POA: Diagnosis not present

## 2019-06-23 DIAGNOSIS — I110 Hypertensive heart disease with heart failure: Secondary | ICD-10-CM | POA: Diagnosis not present

## 2019-06-23 DIAGNOSIS — N390 Urinary tract infection, site not specified: Secondary | ICD-10-CM | POA: Diagnosis not present

## 2019-06-23 DIAGNOSIS — K5721 Diverticulitis of large intestine with perforation and abscess with bleeding: Secondary | ICD-10-CM | POA: Diagnosis not present

## 2019-06-23 DIAGNOSIS — K5790 Diverticulosis of intestine, part unspecified, without perforation or abscess without bleeding: Secondary | ICD-10-CM | POA: Diagnosis not present

## 2019-06-23 DIAGNOSIS — R600 Localized edema: Secondary | ICD-10-CM | POA: Diagnosis not present

## 2019-06-23 DIAGNOSIS — D649 Anemia, unspecified: Secondary | ICD-10-CM | POA: Diagnosis not present

## 2019-06-23 DIAGNOSIS — R531 Weakness: Secondary | ICD-10-CM | POA: Diagnosis not present

## 2019-06-23 DIAGNOSIS — I5033 Acute on chronic diastolic (congestive) heart failure: Secondary | ICD-10-CM | POA: Diagnosis not present

## 2019-06-23 DIAGNOSIS — R0602 Shortness of breath: Secondary | ICD-10-CM | POA: Diagnosis not present

## 2019-06-23 DIAGNOSIS — I509 Heart failure, unspecified: Secondary | ICD-10-CM | POA: Diagnosis not present

## 2019-06-23 DIAGNOSIS — R109 Unspecified abdominal pain: Secondary | ICD-10-CM | POA: Diagnosis not present

## 2019-06-23 DIAGNOSIS — D509 Iron deficiency anemia, unspecified: Secondary | ICD-10-CM | POA: Diagnosis not present

## 2019-06-23 DIAGNOSIS — K921 Melena: Secondary | ICD-10-CM | POA: Diagnosis not present

## 2019-06-23 DIAGNOSIS — R262 Difficulty in walking, not elsewhere classified: Secondary | ICD-10-CM | POA: Diagnosis not present

## 2019-06-23 DIAGNOSIS — E119 Type 2 diabetes mellitus without complications: Secondary | ICD-10-CM | POA: Diagnosis not present

## 2019-06-23 DIAGNOSIS — F339 Major depressive disorder, recurrent, unspecified: Secondary | ICD-10-CM | POA: Diagnosis not present

## 2019-06-23 DIAGNOSIS — Z79899 Other long term (current) drug therapy: Secondary | ICD-10-CM | POA: Diagnosis not present

## 2019-06-23 DIAGNOSIS — K922 Gastrointestinal hemorrhage, unspecified: Secondary | ICD-10-CM | POA: Diagnosis not present

## 2019-07-16 DIAGNOSIS — D649 Anemia, unspecified: Secondary | ICD-10-CM | POA: Diagnosis not present

## 2019-07-16 DIAGNOSIS — M810 Age-related osteoporosis without current pathological fracture: Secondary | ICD-10-CM | POA: Diagnosis not present

## 2019-07-16 DIAGNOSIS — Z7984 Long term (current) use of oral hypoglycemic drugs: Secondary | ICD-10-CM | POA: Diagnosis not present

## 2019-07-16 DIAGNOSIS — E119 Type 2 diabetes mellitus without complications: Secondary | ICD-10-CM | POA: Diagnosis not present

## 2019-07-16 DIAGNOSIS — I5032 Chronic diastolic (congestive) heart failure: Secondary | ICD-10-CM | POA: Diagnosis not present

## 2019-07-16 DIAGNOSIS — K5721 Diverticulitis of large intestine with perforation and abscess with bleeding: Secondary | ICD-10-CM | POA: Diagnosis not present

## 2019-07-16 DIAGNOSIS — I2510 Atherosclerotic heart disease of native coronary artery without angina pectoris: Secondary | ICD-10-CM | POA: Diagnosis not present

## 2019-07-16 DIAGNOSIS — I110 Hypertensive heart disease with heart failure: Secondary | ICD-10-CM | POA: Diagnosis not present

## 2019-07-16 DIAGNOSIS — I5020 Unspecified systolic (congestive) heart failure: Secondary | ICD-10-CM | POA: Diagnosis not present

## 2019-07-17 DIAGNOSIS — K219 Gastro-esophageal reflux disease without esophagitis: Secondary | ICD-10-CM | POA: Diagnosis not present

## 2019-07-17 DIAGNOSIS — D500 Iron deficiency anemia secondary to blood loss (chronic): Secondary | ICD-10-CM | POA: Diagnosis not present

## 2019-07-17 DIAGNOSIS — K5730 Diverticulosis of large intestine without perforation or abscess without bleeding: Secondary | ICD-10-CM | POA: Diagnosis not present

## 2019-07-17 DIAGNOSIS — K649 Unspecified hemorrhoids: Secondary | ICD-10-CM | POA: Diagnosis not present

## 2019-07-18 DIAGNOSIS — D510 Vitamin B12 deficiency anemia due to intrinsic factor deficiency: Secondary | ICD-10-CM | POA: Diagnosis not present

## 2019-07-18 DIAGNOSIS — D649 Anemia, unspecified: Secondary | ICD-10-CM | POA: Diagnosis not present

## 2019-07-20 DIAGNOSIS — G4733 Obstructive sleep apnea (adult) (pediatric): Secondary | ICD-10-CM | POA: Diagnosis not present

## 2019-07-25 DIAGNOSIS — E538 Deficiency of other specified B group vitamins: Secondary | ICD-10-CM | POA: Diagnosis not present

## 2019-07-25 DIAGNOSIS — E1165 Type 2 diabetes mellitus with hyperglycemia: Secondary | ICD-10-CM | POA: Diagnosis not present

## 2019-07-25 DIAGNOSIS — D649 Anemia, unspecified: Secondary | ICD-10-CM | POA: Diagnosis not present

## 2019-07-25 DIAGNOSIS — I10 Essential (primary) hypertension: Secondary | ICD-10-CM | POA: Diagnosis not present

## 2019-08-10 DIAGNOSIS — D649 Anemia, unspecified: Secondary | ICD-10-CM | POA: Diagnosis not present

## 2019-08-15 DIAGNOSIS — I5020 Unspecified systolic (congestive) heart failure: Secondary | ICD-10-CM | POA: Diagnosis not present

## 2019-08-15 DIAGNOSIS — D649 Anemia, unspecified: Secondary | ICD-10-CM | POA: Diagnosis not present

## 2019-08-15 DIAGNOSIS — I110 Hypertensive heart disease with heart failure: Secondary | ICD-10-CM | POA: Diagnosis not present

## 2019-08-15 DIAGNOSIS — M810 Age-related osteoporosis without current pathological fracture: Secondary | ICD-10-CM | POA: Diagnosis not present

## 2019-08-15 DIAGNOSIS — I2510 Atherosclerotic heart disease of native coronary artery without angina pectoris: Secondary | ICD-10-CM | POA: Diagnosis not present

## 2019-08-15 DIAGNOSIS — E119 Type 2 diabetes mellitus without complications: Secondary | ICD-10-CM | POA: Diagnosis not present

## 2019-08-15 DIAGNOSIS — Z7984 Long term (current) use of oral hypoglycemic drugs: Secondary | ICD-10-CM | POA: Diagnosis not present

## 2019-08-15 DIAGNOSIS — K5721 Diverticulitis of large intestine with perforation and abscess with bleeding: Secondary | ICD-10-CM | POA: Diagnosis not present

## 2019-08-15 DIAGNOSIS — I5032 Chronic diastolic (congestive) heart failure: Secondary | ICD-10-CM | POA: Diagnosis not present

## 2019-08-17 DIAGNOSIS — D500 Iron deficiency anemia secondary to blood loss (chronic): Secondary | ICD-10-CM | POA: Diagnosis not present

## 2019-08-17 DIAGNOSIS — D649 Anemia, unspecified: Secondary | ICD-10-CM | POA: Diagnosis not present

## 2019-08-17 DIAGNOSIS — K921 Melena: Secondary | ICD-10-CM | POA: Diagnosis not present

## 2019-08-17 DIAGNOSIS — K5730 Diverticulosis of large intestine without perforation or abscess without bleeding: Secondary | ICD-10-CM | POA: Diagnosis not present

## 2019-09-10 DIAGNOSIS — I110 Hypertensive heart disease with heart failure: Secondary | ICD-10-CM | POA: Diagnosis not present

## 2019-09-10 DIAGNOSIS — I4891 Unspecified atrial fibrillation: Secondary | ICD-10-CM | POA: Diagnosis not present

## 2019-09-10 DIAGNOSIS — R2689 Other abnormalities of gait and mobility: Secondary | ICD-10-CM | POA: Diagnosis not present

## 2019-09-10 DIAGNOSIS — R471 Dysarthria and anarthria: Secondary | ICD-10-CM | POA: Diagnosis not present

## 2019-09-10 DIAGNOSIS — I6782 Cerebral ischemia: Secondary | ICD-10-CM | POA: Diagnosis not present

## 2019-09-10 DIAGNOSIS — Z6826 Body mass index (BMI) 26.0-26.9, adult: Secondary | ICD-10-CM | POA: Diagnosis not present

## 2019-09-10 DIAGNOSIS — R7303 Prediabetes: Secondary | ICD-10-CM | POA: Diagnosis not present

## 2019-09-10 DIAGNOSIS — Z7409 Other reduced mobility: Secondary | ICD-10-CM | POA: Diagnosis not present

## 2019-09-10 DIAGNOSIS — J9811 Atelectasis: Secondary | ICD-10-CM | POA: Diagnosis not present

## 2019-09-10 DIAGNOSIS — I5042 Chronic combined systolic (congestive) and diastolic (congestive) heart failure: Secondary | ICD-10-CM | POA: Diagnosis not present

## 2019-09-10 DIAGNOSIS — I081 Rheumatic disorders of both mitral and tricuspid valves: Secondary | ICD-10-CM | POA: Diagnosis not present

## 2019-09-10 DIAGNOSIS — R4701 Aphasia: Secondary | ICD-10-CM | POA: Diagnosis not present

## 2019-09-10 DIAGNOSIS — I6523 Occlusion and stenosis of bilateral carotid arteries: Secondary | ICD-10-CM | POA: Diagnosis not present

## 2019-09-10 DIAGNOSIS — R2990 Unspecified symptoms and signs involving the nervous system: Secondary | ICD-10-CM | POA: Diagnosis not present

## 2019-09-10 DIAGNOSIS — Z79899 Other long term (current) drug therapy: Secondary | ICD-10-CM | POA: Diagnosis not present

## 2019-09-10 DIAGNOSIS — E871 Hypo-osmolality and hyponatremia: Secondary | ICD-10-CM | POA: Diagnosis not present

## 2019-09-10 DIAGNOSIS — G459 Transient cerebral ischemic attack, unspecified: Secondary | ICD-10-CM | POA: Diagnosis not present

## 2019-09-10 DIAGNOSIS — I700 Atherosclerosis of aorta: Secondary | ICD-10-CM | POA: Diagnosis not present

## 2019-09-10 DIAGNOSIS — Z8673 Personal history of transient ischemic attack (TIA), and cerebral infarction without residual deficits: Secondary | ICD-10-CM | POA: Diagnosis not present

## 2019-09-10 DIAGNOSIS — I498 Other specified cardiac arrhythmias: Secondary | ICD-10-CM | POA: Diagnosis not present

## 2019-09-10 DIAGNOSIS — E669 Obesity, unspecified: Secondary | ICD-10-CM | POA: Diagnosis not present

## 2019-09-10 DIAGNOSIS — I10 Essential (primary) hypertension: Secondary | ICD-10-CM | POA: Diagnosis not present

## 2019-09-10 DIAGNOSIS — Z87891 Personal history of nicotine dependence: Secondary | ICD-10-CM | POA: Diagnosis not present

## 2019-09-10 DIAGNOSIS — R4182 Altered mental status, unspecified: Secondary | ICD-10-CM | POA: Diagnosis not present

## 2019-09-11 DIAGNOSIS — I6389 Other cerebral infarction: Secondary | ICD-10-CM | POA: Diagnosis not present

## 2019-09-11 DIAGNOSIS — I10 Essential (primary) hypertension: Secondary | ICD-10-CM | POA: Diagnosis not present

## 2019-09-11 DIAGNOSIS — J3489 Other specified disorders of nose and nasal sinuses: Secondary | ICD-10-CM | POA: Diagnosis not present

## 2019-09-11 DIAGNOSIS — G9389 Other specified disorders of brain: Secondary | ICD-10-CM | POA: Diagnosis not present

## 2019-09-11 DIAGNOSIS — I517 Cardiomegaly: Secondary | ICD-10-CM | POA: Diagnosis not present

## 2019-09-11 DIAGNOSIS — I498 Other specified cardiac arrhythmias: Secondary | ICD-10-CM | POA: Diagnosis not present

## 2019-09-11 DIAGNOSIS — I6782 Cerebral ischemia: Secondary | ICD-10-CM | POA: Diagnosis not present

## 2019-09-11 DIAGNOSIS — K922 Gastrointestinal hemorrhage, unspecified: Secondary | ICD-10-CM | POA: Diagnosis not present

## 2019-09-11 DIAGNOSIS — R2990 Unspecified symptoms and signs involving the nervous system: Secondary | ICD-10-CM | POA: Diagnosis not present

## 2019-09-11 DIAGNOSIS — Z8673 Personal history of transient ischemic attack (TIA), and cerebral infarction without residual deficits: Secondary | ICD-10-CM | POA: Diagnosis not present

## 2019-09-11 DIAGNOSIS — E785 Hyperlipidemia, unspecified: Secondary | ICD-10-CM | POA: Diagnosis not present

## 2019-09-12 DIAGNOSIS — K922 Gastrointestinal hemorrhage, unspecified: Secondary | ICD-10-CM | POA: Diagnosis not present

## 2019-09-12 DIAGNOSIS — I10 Essential (primary) hypertension: Secondary | ICD-10-CM | POA: Diagnosis not present

## 2019-09-12 DIAGNOSIS — G459 Transient cerebral ischemic attack, unspecified: Secondary | ICD-10-CM | POA: Diagnosis not present

## 2019-09-12 DIAGNOSIS — R7303 Prediabetes: Secondary | ICD-10-CM | POA: Diagnosis not present

## 2019-09-14 DIAGNOSIS — E119 Type 2 diabetes mellitus without complications: Secondary | ICD-10-CM | POA: Diagnosis not present

## 2019-09-14 DIAGNOSIS — D649 Anemia, unspecified: Secondary | ICD-10-CM | POA: Diagnosis not present

## 2019-09-14 DIAGNOSIS — I110 Hypertensive heart disease with heart failure: Secondary | ICD-10-CM | POA: Diagnosis not present

## 2019-09-14 DIAGNOSIS — I5032 Chronic diastolic (congestive) heart failure: Secondary | ICD-10-CM | POA: Diagnosis not present

## 2019-09-14 DIAGNOSIS — I5020 Unspecified systolic (congestive) heart failure: Secondary | ICD-10-CM | POA: Diagnosis not present

## 2019-09-14 DIAGNOSIS — K5721 Diverticulitis of large intestine with perforation and abscess with bleeding: Secondary | ICD-10-CM | POA: Diagnosis not present

## 2019-09-14 DIAGNOSIS — M810 Age-related osteoporosis without current pathological fracture: Secondary | ICD-10-CM | POA: Diagnosis not present

## 2019-09-14 DIAGNOSIS — I2510 Atherosclerotic heart disease of native coronary artery without angina pectoris: Secondary | ICD-10-CM | POA: Diagnosis not present

## 2019-09-14 DIAGNOSIS — Z7984 Long term (current) use of oral hypoglycemic drugs: Secondary | ICD-10-CM | POA: Diagnosis not present

## 2019-09-14 NOTE — Progress Notes (Signed)
Cardiology Office Note:    Date:  09/15/2019   ID:  Marilyn Robinson, DOB [birth year], MRN [id number]  PCP:  Myrlene Broker, MD  Cardiologist:  Shirlee More, MD    Referring MD: Myrlene Broker, MD    ASSESSMENT:    1. Stroke-like symptoms   2. Hyponatremia   3. Hypertensive heart disease with heart failure ([place])    PLAN:    In order of problems listed above:  1. The neurologist impression is that her presentation was focal TIA she is quite adamant with me that it was diffuse and due to hyponatremia.  I think I would address both problems doing her ambulatory heart rhythm monitor and plan close attention to her hyponatremia and stressed with her that she must fluid restrict and monitor her self at home.  She voiced understanding. 2. Stable she had increase the dose of her hydralazine had a near loss of consciousness with standing at home.  Repeat blood pressure by me 150/70 and I would not try to intensify her antihypertensive agents 3. CAD hyperlipidemia stable 4. History of GI bleed anemia and is at risk for recurrence with clopidogrel therapy and follows with GI in [place]   Next appointment: Months   Medication Adjustments/Labs and Tests Ordered: Current medicines are reviewed at length with the patient today.  Concerns regarding medicines are outlined above.  Orders Placed This Encounter  Procedures  . LONG TERM MONITOR (3-14 DAYS)   No orders of the defined types were placed in this encounter.   No chief complaint on file.   History of Present Illness:    Marilyn Robinson is a 86 y.o. female with a hx of CHF, CAD, Dyslipidemia, HTN and symptomatic hyponatremia  and a lacunar  last seen 04/04/2019.  She was admitted to [facility] 09/10/2019 to 09/12/2019.  Her discharge diagnosis was stroke like symptoms.  She has a history of aspirin allergy and clopidogrel was discontinued June 2021 with GI bleeding.  She has been placed on Plavix 3 times a  week.  Her serum sodium was low chronic problem 127 at the time of admission to the hospital.  Compliance with diet, lifestyle and medications: Yes  Laboratory tests show hemoglobin 9.7 platelets 304,000, creatinine 1.03, serum sodium at discharge 129 potassium 4.2.  She had an echocardiogram performed as an inpatient showed normal left ventricular size function mild LVH.  Right ventricle is normal there is no significant valvular abnormality.  CT of the head was negative for acute stroke.  MRI brain no findings of acute stroke.  Discharged in the hospital with a recommendation for an outpatient 7-day 15-day event monitor.  She is quite adamant with me that her presentation was due to hyponatremia and was diffuse rather than a TIA.  She had stopped weighing herself she was a water restricted and she was back on a diuretic all this was worsened by anemia.  She feels back to normal has arrangements to be seen next Tuesday with her PCP and I told her my opinion she should have a BMP checked every week for [redacted] weeks along with a CBC placed back on clopidogrel and she needs to water restrict and have a BMP and not less than than 1 month frequency afterwards.  Neurology recommended event monitor we have set this up.  She asked me about having a transesophageal echocardiogram I told her I do not think it is indicated. Past Medical History:  Diagnosis Date  .  Anemia 07/02/2016  . Bilateral carotid artery stenosis 09/22/2015  . BMI 31.0-31.9,adult 05/06/2015  . Breast cancer ([place])   . Chronic combined systolic and diastolic heart failure ([place]) 07/17/2014   Overview:  EF 56% 09/23/10  . Diabetes mellitus without complication ([place])   . Dislocation of right knee with lateral meniscus tear 03/15/2014  . Ductal carcinoma in situ (DCIS) of left breast 05/06/2015  . GERD (gastroesophageal reflux disease)   . Hyperlipidemia 07/17/2014  . Hypertension   . Hypertensive heart disease with heart failure ([place]) 07/17/2014  .  Hypertensive urgency 09/22/2015  . Hypokalemia 09/22/2015  . Hyponatremia 05/29/2015  . Mild CAD 07/17/2014   Overview:  50% RCA stenosis in 2005  . Non morbid obesity 05/06/2015  . Pyuria 09/22/2015  . Right knee pain 05/17/2014  . S/P arthroscopy of knee 05/17/2014  . Stroke-like episode 05/29/2015  . Stroke-like symptoms 05/29/2015  . TIA (transient ischemic attack)   . Uterine prolapse 07/02/2016    Past Surgical History:  Procedure Laterality Date  . BREAST BIOPSY    . CATARACT EXTRACTION    . CHOLECYSTECTOMY    . CORONARY ANGIOPLASTY WITH STENT PLACEMENT    . EYE SURGERY    . TUBAL LIGATION      Current Medications: Current Meds  Medication Sig  . acetaminophen (TYLENOL) 500 MG tablet Take 500 mg by mouth every 6 (six) hours as needed for mild pain.  . carvedilol (COREG) 25 MG tablet Take 25 mg by mouth 2 (two) times daily.   . clopidogrel (PLAVIX) 75 MG tablet Take 75 mg by mouth every morning.  . clorazepate (TRANXENE) 3.75 MG tablet Take 3.75 mg by mouth daily as needed.  . cyanocobalamin (,VITAMIN B-12,) 1000 MCG/ML injection Inject 1 mL into the muscle every 30 (thirty) days.  . ferrous gluconate (FERGON) 240 (27 FE) MG tablet Take 240 mg by mouth in the morning and at bedtime.  . fluticasone (FLONASE) 50 MCG/ACT nasal spray Place 1 spray into both nostrils in the morning and at bedtime.  . hydrALAZINE (APRESOLINE) 25 MG tablet Take 25 mg 3 (three) times daily by mouth. BP > 165 take an extra 1/2 of hydralazine   . MAGNESIUM PO Take by mouth daily.  . Meclizine HCl 25 MG CHEW Chew 25 mg by mouth in the morning, at noon, and at bedtime.  . metFORMIN (GLUCOPHAGE-XR) 500 MG 24 hr tablet Take 500 mg by mouth daily with breakfast. 500 mg in the morning and 1,000 at night  . mirtazapine (REMERON) 15 MG tablet Take 15 mg by mouth daily.   Marland Kitchen olmesartan (BENICAR) 40 MG tablet Take 1 tablet by mouth daily.  . pantoprazole (PROTONIX) 40 MG tablet Take 40 mg by mouth daily.  . rosuvastatin  (CRESTOR) 10 MG tablet Take 10 mg by mouth every other day.     Allergies:   Antihistamines, diphenhydramine-type; Lipitor [atorvastatin]; Penicillins; Allopurinol; Aspirin; and Fenofibrate   Social History   Socioeconomic History  . Marital status: Married    Spouse name: Not on file  . Number of children: Not on file  . Years of education: Not on file  . Highest education level: Not on file  Occupational History  . Not on file  Tobacco Use  . Smoking status: Former Research scientist (life sciences)  . Smokeless tobacco: Never Used  Vaping Use  . Vaping Use: Never used  Substance and Sexual Activity  . Alcohol use: No  . Drug use: No  . Sexual activity: Not on  file  Other Topics Concern  . Not on file  Social History Narrative  . Not on file   Social Determinants of Health   Financial Resource Strain:   . Difficulty of Paying Living Expenses: Not on file  Food Insecurity:   . Worried About Charity fundraiser in the Last Year: Not on file  . Ran Out of Food in the Last Year: Not on file  Transportation Needs:   . Lack of Transportation (Medical): Not on file  . Lack of Transportation (Non-Medical): Not on file  Physical Activity:   . Days of Exercise per Week: Not on file  . Minutes of Exercise per Session: Not on file  Stress:   . Feeling of Stress : Not on file  Social Connections:   . Frequency of Communication with Friends and Family: Not on file  . Frequency of Social Gatherings with Friends and Family: Not on file  . Attends Religious Services: Not on file  . Active Member of Clubs or Organizations: Not on file  . Attends Archivist Meetings: Not on file  . Marital Status: Not on file     Family History: The patient's family history includes CAD in her father and mother; Diabetes in her father and mother; Stroke in her mother. ROS:   Please see the history of present illness.    All other systems reviewed and are negative.  EKGs/Labs/Other Studies Reviewed:    The  following studies were reviewed today:    Recent Labs: Her serum sodium at discharge was 129 No results found for requested labs within last 8760 hours.  Recent Lipid Panel    Component Value Date/Time   CHOL 119 08/19/2016 1042   TRIG 107 08/19/2016 1042   HDL 41 08/19/2016 1042   CHOLHDL 3.3 05/29/2015 1549   VLDL 22 05/29/2015 1549   LDLCALC 57 08/19/2016 1042    Physical Exam:    VS:  BP (!) 165/76   Pulse 68   Ht 5\' 6"  (1.676 m)   Wt 168 lb 12.8 oz (76.6 kg)   SpO2 93%   BMI 27.25 kg/m     Wt Readings from Last 3 Encounters:  09/15/19 168 lb 12.8 oz (76.6 kg)  04/04/19 178 lb (80.7 kg)  07/18/18 183 lb 9.6 oz (83.3 kg)     GEN:  Well nourished, well developed in no acute distress HEENT: Normal NECK: No JVD; No carotid bruits LYMPHATICS: No lymphadenopathy CARDIAC: RRR, no murmurs, rubs, gallops RESPIRATORY:  Clear to auscultation without rales, wheezing or rhonchi  ABDOMEN: Soft, non-tender, non-distended MUSCULOSKELETAL:  No edema; No deformity  SKIN: Warm and dry NEUROLOGIC:  Alert and oriented x 3 PSYCHIATRIC:  Normal affect    Signed, Shirlee More, MD  09/15/2019 11:50 AM    [place]

## 2019-09-15 ENCOUNTER — Other Ambulatory Visit: Payer: Self-pay

## 2019-09-15 ENCOUNTER — Ambulatory Visit (INDEPENDENT_AMBULATORY_CARE_PROVIDER_SITE_OTHER): Payer: PPO | Admitting: Cardiology

## 2019-09-15 ENCOUNTER — Encounter: Payer: Self-pay | Admitting: Cardiology

## 2019-09-15 ENCOUNTER — Ambulatory Visit (INDEPENDENT_AMBULATORY_CARE_PROVIDER_SITE_OTHER): Payer: PPO

## 2019-09-15 VITALS — BP 165/76 | HR 68 | Ht 66.0 in | Wt 168.8 lb

## 2019-09-15 DIAGNOSIS — I110 Hypertensive heart disease with heart failure: Secondary | ICD-10-CM | POA: Diagnosis not present

## 2019-09-15 DIAGNOSIS — E871 Hypo-osmolality and hyponatremia: Secondary | ICD-10-CM | POA: Diagnosis not present

## 2019-09-15 DIAGNOSIS — R2990 Unspecified symptoms and signs involving the nervous system: Secondary | ICD-10-CM

## 2019-09-15 DIAGNOSIS — Z9189 Other specified personal risk factors, not elsewhere classified: Secondary | ICD-10-CM

## 2019-09-15 NOTE — Patient Instructions (Signed)
Medication Instructions:  Your physician recommends that you continue on your current medications as directed. Please refer to the Current Medication list given to you today.  *If you need a refill on your cardiac medications before your next appointment, please call your pharmacy*   Lab Work: None If you have labs (blood work) drawn today and your tests are completely normal, you will receive your results only by: MyChart Message (if you have MyChart) OR A paper copy in the mail If you have any lab test that is abnormal or we need to change your treatment, we will call you to review the results.   Testing/Procedures: A zio monitor was ordered today. It will remain on for 7 days. You will then return monitor and event diary in provided box. It takes 1-2 weeks for report to be downloaded and returned to us. We will call you with the results. If monitor falls off or has orange flashing light, please call Zio for further instructions.     Follow-Up: At [facility], you and your health needs are our priority.  As part of our continuing mission to provide you with exceptional heart care, we have created designated Provider Care Teams.  These Care Teams include your primary Cardiologist (physician) and Advanced Practice Providers (APPs -  Physician Assistants and Nurse Practitioners) who all work together to provide you with the care you need, when you need it.  We recommend signing up for the patient portal called "MyChart".  Sign up information is provided on this After Visit Summary.  MyChart is used to connect with patients for Virtual Visits (Telemedicine).  Patients are able to view lab/test results, encounter notes, upcoming appointments, etc.  Non-urgent messages can be sent to your provider as well.   To learn more about what you can do with MyChart, go to [email or web address].    Your next appointment:   3 month(s)  The format for your next appointment:   In Person  Provider:    Brian Munley, MD    Other Instructions   

## 2019-09-19 DIAGNOSIS — D509 Iron deficiency anemia, unspecified: Secondary | ICD-10-CM | POA: Insufficient documentation

## 2019-09-19 DIAGNOSIS — E871 Hypo-osmolality and hyponatremia: Secondary | ICD-10-CM | POA: Diagnosis not present

## 2019-09-19 DIAGNOSIS — D500 Iron deficiency anemia secondary to blood loss (chronic): Secondary | ICD-10-CM | POA: Insufficient documentation

## 2019-09-19 DIAGNOSIS — Z23 Encounter for immunization: Secondary | ICD-10-CM | POA: Diagnosis not present

## 2019-09-19 DIAGNOSIS — D508 Other iron deficiency anemias: Secondary | ICD-10-CM | POA: Diagnosis not present

## 2019-09-19 HISTORY — DX: Iron deficiency anemia, unspecified: D50.9

## 2019-09-19 HISTORY — DX: Iron deficiency anemia secondary to blood loss (chronic): D50.0

## 2019-09-22 DIAGNOSIS — K5721 Diverticulitis of large intestine with perforation and abscess with bleeding: Secondary | ICD-10-CM | POA: Diagnosis not present

## 2019-09-22 DIAGNOSIS — I110 Hypertensive heart disease with heart failure: Secondary | ICD-10-CM | POA: Diagnosis not present

## 2019-09-22 DIAGNOSIS — I5032 Chronic diastolic (congestive) heart failure: Secondary | ICD-10-CM | POA: Diagnosis not present

## 2019-09-26 DIAGNOSIS — E871 Hypo-osmolality and hyponatremia: Secondary | ICD-10-CM | POA: Diagnosis not present

## 2019-09-26 DIAGNOSIS — D508 Other iron deficiency anemias: Secondary | ICD-10-CM | POA: Diagnosis not present

## 2019-09-30 DIAGNOSIS — Z9189 Other specified personal risk factors, not elsewhere classified: Secondary | ICD-10-CM | POA: Diagnosis not present

## 2019-10-02 ENCOUNTER — Ambulatory Visit: Payer: PPO | Admitting: Cardiology

## 2019-10-04 DIAGNOSIS — E871 Hypo-osmolality and hyponatremia: Secondary | ICD-10-CM | POA: Diagnosis not present

## 2019-10-09 DIAGNOSIS — N819 Female genital prolapse, unspecified: Secondary | ICD-10-CM | POA: Diagnosis not present

## 2019-10-09 DIAGNOSIS — Z4689 Encounter for fitting and adjustment of other specified devices: Secondary | ICD-10-CM | POA: Diagnosis not present

## 2019-10-11 DIAGNOSIS — D508 Other iron deficiency anemias: Secondary | ICD-10-CM | POA: Diagnosis not present

## 2019-10-11 DIAGNOSIS — E871 Hypo-osmolality and hyponatremia: Secondary | ICD-10-CM | POA: Diagnosis not present

## 2019-10-14 DIAGNOSIS — Z7984 Long term (current) use of oral hypoglycemic drugs: Secondary | ICD-10-CM | POA: Diagnosis not present

## 2019-10-14 DIAGNOSIS — E119 Type 2 diabetes mellitus without complications: Secondary | ICD-10-CM | POA: Diagnosis not present

## 2019-10-14 DIAGNOSIS — D649 Anemia, unspecified: Secondary | ICD-10-CM | POA: Diagnosis not present

## 2019-10-14 DIAGNOSIS — I5032 Chronic diastolic (congestive) heart failure: Secondary | ICD-10-CM | POA: Diagnosis not present

## 2019-10-14 DIAGNOSIS — I5020 Unspecified systolic (congestive) heart failure: Secondary | ICD-10-CM | POA: Diagnosis not present

## 2019-10-14 DIAGNOSIS — M810 Age-related osteoporosis without current pathological fracture: Secondary | ICD-10-CM | POA: Diagnosis not present

## 2019-10-14 DIAGNOSIS — I110 Hypertensive heart disease with heart failure: Secondary | ICD-10-CM | POA: Diagnosis not present

## 2019-10-14 DIAGNOSIS — I2510 Atherosclerotic heart disease of native coronary artery without angina pectoris: Secondary | ICD-10-CM | POA: Diagnosis not present

## 2019-10-14 DIAGNOSIS — K5721 Diverticulitis of large intestine with perforation and abscess with bleeding: Secondary | ICD-10-CM | POA: Diagnosis not present

## 2019-10-17 ENCOUNTER — Telehealth: Payer: Self-pay

## 2019-10-17 NOTE — Telephone Encounter (Signed)
-----   Message from Richardo Priest, MD sent at 10/16/2019  5:50 PM EDT ----- Good result there were no episodes of atrial fibrillation or flutter.

## 2019-10-17 NOTE — Telephone Encounter (Signed)
Spoke with patient regarding results and recommendation.  Patient verbalizes understanding and is agreeable to plan of care. Advised patient to call back with any issues or concerns.  

## 2019-10-18 DIAGNOSIS — J3089 Other allergic rhinitis: Secondary | ICD-10-CM | POA: Diagnosis not present

## 2019-10-18 DIAGNOSIS — F3342 Major depressive disorder, recurrent, in full remission: Secondary | ICD-10-CM | POA: Diagnosis not present

## 2019-10-18 DIAGNOSIS — E782 Mixed hyperlipidemia: Secondary | ICD-10-CM | POA: Diagnosis not present

## 2019-10-18 DIAGNOSIS — K219 Gastro-esophageal reflux disease without esophagitis: Secondary | ICD-10-CM | POA: Diagnosis not present

## 2019-10-18 DIAGNOSIS — I10 Essential (primary) hypertension: Secondary | ICD-10-CM | POA: Diagnosis not present

## 2019-10-18 DIAGNOSIS — E871 Hypo-osmolality and hyponatremia: Secondary | ICD-10-CM | POA: Diagnosis not present

## 2019-10-18 DIAGNOSIS — Z0000 Encounter for general adult medical examination without abnormal findings: Secondary | ICD-10-CM | POA: Diagnosis not present

## 2019-10-18 DIAGNOSIS — E1165 Type 2 diabetes mellitus with hyperglycemia: Secondary | ICD-10-CM | POA: Diagnosis not present

## 2019-10-18 DIAGNOSIS — D509 Iron deficiency anemia, unspecified: Secondary | ICD-10-CM | POA: Diagnosis not present

## 2019-10-20 DIAGNOSIS — E113211 Type 2 diabetes mellitus with mild nonproliferative diabetic retinopathy with macular edema, right eye: Secondary | ICD-10-CM | POA: Diagnosis not present

## 2019-10-20 DIAGNOSIS — H43812 Vitreous degeneration, left eye: Secondary | ICD-10-CM | POA: Diagnosis not present

## 2019-10-30 DIAGNOSIS — D0512 Intraductal carcinoma in situ of left breast: Secondary | ICD-10-CM | POA: Diagnosis not present

## 2019-11-08 DIAGNOSIS — E871 Hypo-osmolality and hyponatremia: Secondary | ICD-10-CM | POA: Diagnosis not present

## 2019-11-08 DIAGNOSIS — D649 Anemia, unspecified: Secondary | ICD-10-CM | POA: Diagnosis not present

## 2019-11-13 DIAGNOSIS — D649 Anemia, unspecified: Secondary | ICD-10-CM | POA: Diagnosis not present

## 2019-11-13 DIAGNOSIS — K5730 Diverticulosis of large intestine without perforation or abscess without bleeding: Secondary | ICD-10-CM | POA: Diagnosis not present

## 2019-11-13 DIAGNOSIS — K921 Melena: Secondary | ICD-10-CM | POA: Diagnosis not present

## 2019-11-13 DIAGNOSIS — D500 Iron deficiency anemia secondary to blood loss (chronic): Secondary | ICD-10-CM | POA: Diagnosis not present

## 2019-12-06 DIAGNOSIS — C50919 Malignant neoplasm of unspecified site of unspecified female breast: Secondary | ICD-10-CM | POA: Insufficient documentation

## 2019-12-06 DIAGNOSIS — I10 Essential (primary) hypertension: Secondary | ICD-10-CM | POA: Insufficient documentation

## 2019-12-14 DIAGNOSIS — D509 Iron deficiency anemia, unspecified: Secondary | ICD-10-CM | POA: Diagnosis not present

## 2019-12-14 DIAGNOSIS — K219 Gastro-esophageal reflux disease without esophagitis: Secondary | ICD-10-CM | POA: Diagnosis not present

## 2019-12-15 ENCOUNTER — Ambulatory Visit (INDEPENDENT_AMBULATORY_CARE_PROVIDER_SITE_OTHER): Payer: PPO | Admitting: Cardiology

## 2019-12-15 ENCOUNTER — Other Ambulatory Visit: Payer: Self-pay

## 2019-12-15 ENCOUNTER — Encounter: Payer: Self-pay | Admitting: Cardiology

## 2019-12-15 VITALS — BP 150/60 | HR 64 | Ht 66.0 in | Wt 170.2 lb

## 2019-12-15 DIAGNOSIS — E871 Hypo-osmolality and hyponatremia: Secondary | ICD-10-CM | POA: Diagnosis not present

## 2019-12-15 DIAGNOSIS — I5042 Chronic combined systolic (congestive) and diastolic (congestive) heart failure: Secondary | ICD-10-CM

## 2019-12-15 DIAGNOSIS — E782 Mixed hyperlipidemia: Secondary | ICD-10-CM | POA: Diagnosis not present

## 2019-12-15 DIAGNOSIS — I110 Hypertensive heart disease with heart failure: Secondary | ICD-10-CM | POA: Diagnosis not present

## 2019-12-15 DIAGNOSIS — I2510 Atherosclerotic heart disease of native coronary artery without angina pectoris: Secondary | ICD-10-CM

## 2019-12-15 NOTE — Progress Notes (Signed)
Cardiology Office Note:    Date:  12/15/2019   ID:  Marilyn Robinson, DOB Oct 02, 1933, MRN [id number]  PCP:  Myrlene Broker, MD  Cardiologist:  Shirlee More, MD    Referring MD: Myrlene Broker, MD    ASSESSMENT:    1. Chronic combined systolic and diastolic heart failure ([place])   2. Hypertensive heart disease with heart failure ([place])   3. Mild CAD   4. Mixed hyperlipidemia   5. Hyponatremia    PLAN:    In order of problems listed above:  1. She is doing better her heart failure is compensated presently not on a loop diuretic.  Continue other medical therapy including her beta-blocker and hydralazine along with ARB. 2. BP is stable I get a blood pressure 150/60 when I checked sitting resting in view of orthostatic hypotension I think this is a good endpoint for her and I challenged her to check her blood pressure frequently at home and to record 3. Stable CAD having no anginal discomfort continue current medical therapy clopidogrel beta-blocker and her statin 4. Stable continue her statin she is seen multiple doctors having multiple phlebotomies I will ask her the next time she is scheduled for labs to call us and we will add a lipid profile. 5. Approved maintaining salt sodium water balance with fluid restriction   Next appointment: 4 months   Medication Adjustments/Labs and Tests Ordered: Current medicines are reviewed at length with the patient today.  Concerns regarding medicines are outlined above.  No orders of the defined types were placed in this encounter.  No orders of the defined types were placed in this encounter.   Chief Complaint  Patient presents with  . Follow-up  . Congestive Heart Failure  . Hypertension    History of Present Illness:    Marilyn Robinson is a 86 y.o. female with a hx of heart failure CAD dyslipidemia hypertension symptomatic hyponatremia and lacunar stroke.  Last seen 05/03/2019 and virtual visit.  Compliance with diet,  lifestyle and medications: Yes  Recent labs with her PCP [facility] 12/14/2019: Serum sodium 133 iron saturation diminished 7% ferritin diminished at 28 hemoglobin 10.8  Overall she is doing better when she checks home blood pressure perhaps once a week she typically runs 150/60 and has had no further episodes of orthostatic hypotension.  She is still fatigued and she is quite iron deficient and she is waiting for a phone call from GI and expects to get more parenteral IV iron.  No angina shortness of breath edema palpitation or syncope.  Is doing a good job restricting her fluid intake with chronic hyponatremia Past Medical History:  Diagnosis Date  . Anemia 07/02/2016  . Bilateral carotid artery stenosis 09/22/2015  . BMI 31.0-31.9,adult 05/06/2015  . Breast cancer ([place])   . Chronic combined systolic and diastolic heart failure ([place]) 07/17/2014   Overview:  EF 56% 09/23/10  . Diabetes mellitus without complication ([place])   . Dislocation of right knee with lateral meniscus tear 03/15/2014  . Ductal carcinoma in situ (DCIS) of left breast 05/06/2015  . GERD (gastroesophageal reflux disease)   . Hyperlipidemia 07/17/2014  . Hypertension   . Hypertensive heart disease with heart failure ([place]) 07/17/2014  . Hypertensive urgency 09/22/2015  . Hypokalemia 09/22/2015  . Hyponatremia 05/29/2015  . Mild CAD 07/17/2014   Overview:  50% RCA stenosis in 2005  . Non morbid obesity 05/06/2015  . Pyuria 09/22/2015  . Right knee pain 05/17/2014  .  S/P arthroscopy of knee 05/17/2014  . Stroke-like episode 05/29/2015  . Stroke-like symptoms 05/29/2015  . TIA (transient ischemic attack)   . Uterine prolapse 07/02/2016    Past Surgical History:  Procedure Laterality Date  . BREAST BIOPSY    . CATARACT EXTRACTION    . CHOLECYSTECTOMY    . CORONARY ANGIOPLASTY WITH STENT PLACEMENT    . EYE SURGERY    . TUBAL LIGATION      Current Medications: Current Meds  Medication Sig  . acetaminophen (TYLENOL) 500 MG  tablet Take 500 mg by mouth every 6 (six) hours as needed for mild pain.  . carvedilol (COREG) 25 MG tablet Take 25 mg by mouth 2 (two) times daily.   . clopidogrel (PLAVIX) 75 MG tablet Take 75 mg by mouth every morning.  . clorazepate (TRANXENE) 3.75 MG tablet Take 3.75 mg by mouth daily as needed.  . cyanocobalamin (,VITAMIN B-12,) 1000 MCG/ML injection Inject 1 mL into the muscle every 30 (thirty) days.  . ferrous gluconate (FERGON) 240 (27 FE) MG tablet Take 240 mg by mouth in the morning and at bedtime.  . fluticasone (FLONASE) 50 MCG/ACT nasal spray Place 1 spray into both nostrils in the morning and at bedtime.  . hydrALAZINE (APRESOLINE) 25 MG tablet Take 25 mg 3 (three) times daily by mouth. BP > 165 take an extra 1/2 of hydralazine   . MAGNESIUM PO Take by mouth daily.  . Meclizine HCl 25 MG CHEW Chew 25 mg by mouth in the morning, at noon, and at bedtime.  . metFORMIN (GLUCOPHAGE-XR) 500 MG 24 hr tablet Take 500 mg by mouth daily with breakfast. 500 mg in the morning and 1,000 at night  . mirtazapine (REMERON) 15 MG tablet Take 15 mg by mouth daily.   Marland Kitchen olmesartan (BENICAR) 40 MG tablet Take 1 tablet by mouth daily.  . pantoprazole (PROTONIX) 40 MG tablet Take 40 mg by mouth daily.  . rosuvastatin (CRESTOR) 10 MG tablet Take 10 mg by mouth every other day.     Allergies:   Antihistamines, diphenhydramine-type; Lipitor [atorvastatin]; Penicillins; Allopurinol; Aspirin; and Fenofibrate   Social History   Socioeconomic History  . Marital status: Married    Spouse name: Not on file  . Number of children: Not on file  . Years of education: Not on file  . Highest education level: Not on file  Occupational History  . Not on file  Tobacco Use  . Smoking status: Former Research scientist (life sciences)  . Smokeless tobacco: Never Used  Vaping Use  . Vaping Use: Never used  Substance and Sexual Activity  . Alcohol use: No  . Drug use: No  . Sexual activity: Not on file  Other Topics Concern  . Not on  file  Social History Narrative  . Not on file   Social Determinants of Health   Financial Resource Strain:   . Difficulty of Paying Living Expenses: Not on file  Food Insecurity:   . Worried About Charity fundraiser in the Last Year: Not on file  . Ran Out of Food in the Last Year: Not on file  Transportation Needs:   . Lack of Transportation (Medical): Not on file  . Lack of Transportation (Non-Medical): Not on file  Physical Activity:   . Days of Exercise per Week: Not on file  . Minutes of Exercise per Session: Not on file  Stress:   . Feeling of Stress : Not on file  Social Connections:   . Frequency  of Communication with Friends and Family: Not on file  . Frequency of Social Gatherings with Friends and Family: Not on file  . Attends Religious Services: Not on file  . Active Member of Clubs or Organizations: Not on file  . Attends Archivist Meetings: Not on file  . Marital Status: Not on file     Family History: The patient's family history includes CAD in her father and mother; Diabetes in her father and mother; Stroke in her mother. ROS:   Please see the history of present illness.    All other systems reviewed and are negative.  EKGs/Labs/Other Studies Reviewed:    The following studies were reviewed today:   Recent Lipid Panel    Component Value Date/Time   CHOL 119 08/19/2016 1042   TRIG 107 08/19/2016 1042   HDL 41 08/19/2016 1042   CHOLHDL 3.3 05/29/2015 1549   VLDL 22 05/29/2015 1549   LDLCALC 57 08/19/2016 1042    Physical Exam:    VS:  BP (!) 180/75   Pulse 64   Ht 5\' 6"  (1.676 m)   Wt 170 lb 3.2 oz (77.2 kg)   SpO2 95%   BMI 27.47 kg/m     Wt Readings from Last 3 Encounters:  12/15/19 170 lb 3.2 oz (77.2 kg)  09/15/19 168 lb 12.8 oz (76.6 kg)  04/04/19 178 lb (80.7 kg)     GEN:  Well nourished, well developed in no acute distress HEENT: Normal NECK: No JVD; No carotid bruits LYMPHATICS: No lymphadenopathy CARDIAC: RRR,  no murmurs, rubs, gallops RESPIRATORY:  Clear to auscultation without rales, wheezing or rhonchi  ABDOMEN: Soft, non-tender, non-distended MUSCULOSKELETAL:  No edema; No deformity  SKIN: Warm and dry NEUROLOGIC:  Alert and oriented x 3 PSYCHIATRIC:  Normal affect    Signed, Shirlee More, MD  12/15/2019 1:37 PM    [facility]

## 2019-12-15 NOTE — Patient Instructions (Signed)
Medication Instructions:  Your physician recommends that you continue on your current medications as directed. Please refer to the Current Medication list given to you today.  *If you need a refill on your cardiac medications before your next appointment, please call your pharmacy*   Lab Work: None If you have labs (blood work) drawn today and your tests are completely normal, you will receive your results only by: Marland Kitchen MyChart Message (if you have MyChart) OR . A paper copy in the mail If you have any lab test that is abnormal or we need to change your treatment, we will call you to review the results.   Testing/Procedures: None   Follow-Up: At [facility], you and your health needs are our priority.  As part of our continuing mission to provide you with exceptional heart care, we have created designated Provider Care Teams.  These Care Teams include your primary Cardiologist (physician) and Advanced Practice Providers (APPs -  Physician Assistants and Nurse Practitioners) who all work together to provide you with the care you need, when you need it.  We recommend signing up for the patient portal called "MyChart".  Sign up information is provided on this After Visit Summary.  MyChart is used to connect with patients for Virtual Visits (Telemedicine).  Patients are able to view lab/test results, encounter notes, upcoming appointments, etc.  Non-urgent messages can be sent to your provider as well.   To learn more about what you can do with MyChart, go to [email or web address].    Your next appointment:   6 month(s)  The format for your next appointment:   In Person  Provider:   Shirlee More, MD   Other Instructions Please check your blood pressure several times a  Week and keep a record of these readings for [place].

## 2020-01-01 DIAGNOSIS — D649 Anemia, unspecified: Secondary | ICD-10-CM | POA: Diagnosis not present

## 2020-01-08 DIAGNOSIS — Z4689 Encounter for fitting and adjustment of other specified devices: Secondary | ICD-10-CM | POA: Diagnosis not present

## 2020-01-08 DIAGNOSIS — N814 Uterovaginal prolapse, unspecified: Secondary | ICD-10-CM | POA: Diagnosis not present

## 2020-01-25 DIAGNOSIS — E871 Hypo-osmolality and hyponatremia: Secondary | ICD-10-CM | POA: Diagnosis not present

## 2020-01-25 DIAGNOSIS — D509 Iron deficiency anemia, unspecified: Secondary | ICD-10-CM | POA: Diagnosis not present

## 2020-01-25 DIAGNOSIS — R399 Unspecified symptoms and signs involving the genitourinary system: Secondary | ICD-10-CM | POA: Diagnosis not present

## 2020-04-17 DIAGNOSIS — D649 Anemia, unspecified: Secondary | ICD-10-CM | POA: Diagnosis not present

## 2020-04-17 DIAGNOSIS — D500 Iron deficiency anemia secondary to blood loss (chronic): Secondary | ICD-10-CM | POA: Diagnosis not present

## 2020-04-17 DIAGNOSIS — K5730 Diverticulosis of large intestine without perforation or abscess without bleeding: Secondary | ICD-10-CM | POA: Diagnosis not present

## 2020-04-17 DIAGNOSIS — K921 Melena: Secondary | ICD-10-CM | POA: Diagnosis not present

## 2020-04-29 DIAGNOSIS — R5381 Other malaise: Secondary | ICD-10-CM | POA: Diagnosis not present

## 2020-04-29 DIAGNOSIS — E1165 Type 2 diabetes mellitus with hyperglycemia: Secondary | ICD-10-CM | POA: Diagnosis not present

## 2020-04-29 DIAGNOSIS — E782 Mixed hyperlipidemia: Secondary | ICD-10-CM | POA: Diagnosis not present

## 2020-04-29 DIAGNOSIS — R5383 Other fatigue: Secondary | ICD-10-CM | POA: Diagnosis not present

## 2020-04-29 DIAGNOSIS — N1831 Chronic kidney disease, stage 3a: Secondary | ICD-10-CM

## 2020-04-29 DIAGNOSIS — K219 Gastro-esophageal reflux disease without esophagitis: Secondary | ICD-10-CM | POA: Diagnosis not present

## 2020-04-29 DIAGNOSIS — D508 Other iron deficiency anemias: Secondary | ICD-10-CM | POA: Diagnosis not present

## 2020-04-29 DIAGNOSIS — I129 Hypertensive chronic kidney disease with stage 1 through stage 4 chronic kidney disease, or unspecified chronic kidney disease: Secondary | ICD-10-CM | POA: Diagnosis not present

## 2020-04-29 DIAGNOSIS — F3342 Major depressive disorder, recurrent, in full remission: Secondary | ICD-10-CM | POA: Diagnosis not present

## 2020-04-29 HISTORY — DX: Chronic kidney disease, stage 3a: N18.31

## 2020-05-21 DIAGNOSIS — Z1231 Encounter for screening mammogram for malignant neoplasm of breast: Secondary | ICD-10-CM | POA: Diagnosis not present

## 2020-06-04 DIAGNOSIS — D0512 Intraductal carcinoma in situ of left breast: Secondary | ICD-10-CM | POA: Diagnosis not present

## 2020-06-13 NOTE — Progress Notes (Signed)
Cardiology Office Note:    Date:  06/14/2020   ID:  Marilyn Robinson, DOB May 01, 1933, MRN [id number]  PCP:  Myrlene Broker, MD  Cardiologist:  Shirlee More, MD    Referring MD: Myrlene Broker, MD    ASSESSMENT:    1. Hypertensive heart disease with heart failure ([place])   2. Hypertension, unspecified type   3. Mild CAD   4. Mixed hyperlipidemia   5. Hyponatremia    PLAN:    In order of problems listed above:  1. She is markedly improved functionally blood pressure at target no fluid overloadShe will continue her multiple antihypertensive agents carvedilol low-dose furosemide hydralazine and Benicar. 2. Stable CAD continue medical therapy including clopidogrel with stroke and her high intensity statin [place] Heart Association class I 3. Lipids at target continue high intensity statin 4. Stable no recurrent clinical hyponatremia   Next appointment: 6 months   Medication Adjustments/Labs and Tests Ordered: Current medicines are reviewed at length with the patient today.  Concerns regarding medicines are outlined above.  Orders Placed This Encounter  Procedures  . EKG 12-Lead   No orders of the defined types were placed in this encounter.   Chief Complaint  Patient presents with  . Follow-up  . Congestive Heart Failure    History of Present Illness:    Marilyn Robinson is a 87 y.o. female with a hx of  heart failure CAD dyslipidemia hypertension symptomatic hyponatremia iron deficiency  and lacunar stroke  last seen 12/15/2019.  Compliance with diet, lifestyle and medications: Yes  Fortunately she has slowly and gradually improved and is back to she is to be She is active her gait is stable she has had no falls and she feels stronger and more confident. She takes Remeron and has a little bit of edema in the left lower extremity affected by polio No shortness of breath orthopnea chest pain palpitation or syncope Blood pressure is at target EKG today shows  sinus bradycardia 53 bpm otherwise normal  Most recent labs [facility] 04/29/2020: Cholesterol 132 LDL 72 triglycerides 133 HDL 41, lipids are at target CMP shows a potassium 4.9 creatinine 1.09 GFR 50 cc normal liver function test Hemoglobin is improved to 11.6 Iron profile 1 month ago shows normalization ferritin 157 saturation 33% iron 108.  Echocardiogram 09/11/2019 shows EF 55 to 60% and no significant valvular abnormality Past Medical History:  Diagnosis Date  . Anemia 07/02/2016  . Bilateral carotid artery stenosis 09/22/2015  . BMI 31.0-31.9,adult 05/06/2015  . Breast cancer ([place])   . Chronic combined systolic and diastolic heart failure ([place]) 07/17/2014   Overview:  EF 56% 09/23/10  . Diabetes mellitus without complication ([place])   . Dislocation of right knee with lateral meniscus tear 03/15/2014  . Ductal carcinoma in situ (DCIS) of left breast 05/06/2015  . GERD (gastroesophageal reflux disease)   . Hyperlipidemia 07/17/2014  . Hypertension   . Hypertensive heart disease with heart failure ([place]) 07/17/2014  . Hypertensive urgency 09/22/2015  . Hypokalemia 09/22/2015  . Hyponatremia 05/29/2015  . Mild CAD 07/17/2014   Overview:  50% RCA stenosis in 2005  . Non morbid obesity 05/06/2015  . Pyuria 09/22/2015  . Right knee pain 05/17/2014  . S/P arthroscopy of knee 05/17/2014  . Stroke-like episode 05/29/2015  . Stroke-like symptoms 05/29/2015  . TIA (transient ischemic attack)   . Uterine prolapse 07/02/2016    Past Surgical History:  Procedure Laterality Date  . BREAST BIOPSY    .  CATARACT EXTRACTION    . CHOLECYSTECTOMY    . CORONARY ANGIOPLASTY WITH STENT PLACEMENT    . EYE SURGERY    . TUBAL LIGATION      Current Medications: Current Meds  Medication Sig  . acetaminophen (TYLENOL) 500 MG tablet Take 500 mg by mouth every 6 (six) hours as needed for mild pain.  . carvedilol (COREG) 25 MG tablet Take 25 mg by mouth 2 (two) times daily.   . clopidogrel (PLAVIX) 75 MG  tablet Take 75 mg by mouth every morning.  . clopidogrel (PLAVIX) 75 MG tablet Take 75 mg by mouth 3 (three) times a week.  . cyanocobalamin (,VITAMIN B-12,) 1000 MCG/ML injection Inject 1 mL into the muscle every 30 (thirty) days.  . ferrous gluconate (FERGON) 240 (27 FE) MG tablet Take 240 mg by mouth in the morning and at bedtime.  . fluticasone (FLONASE) 50 MCG/ACT nasal spray Place 1 spray into both nostrils in the morning and at bedtime.  . furosemide (LASIX) 20 MG tablet Take 20 mg by mouth daily as needed for edema.  . hydrALAZINE (APRESOLINE) 25 MG tablet Take 25 mg 3 (three) times daily by mouth. BP > 165 take an extra 1/2 of hydralazine   . MAGNESIUM PO Take by mouth daily.  . Meclizine HCl 25 MG CHEW Chew 25 mg by mouth in the morning, at noon, and at bedtime.  . metFORMIN (GLUCOPHAGE-XR) 500 MG 24 hr tablet Take 500 mg by mouth daily with breakfast. 500 mg in the morning and 1,000 at night  . mirtazapine (REMERON) 15 MG tablet Take 15 mg by mouth daily.   Marland Kitchen olmesartan (BENICAR) 40 MG tablet Take 1 tablet by mouth daily.  . pantoprazole (PROTONIX) 40 MG tablet Take 40 mg by mouth daily.  . rosuvastatin (CRESTOR) 10 MG tablet Take 10 mg by mouth every other day.  . Vitamin D, Ergocalciferol, (DRISDOL) 1.25 MG (50000 UNIT) CAPS capsule Take 1 capsule by mouth once a week.     Allergies:   Antihistamines, diphenhydramine-type; Lipitor [atorvastatin]; Penicillins; Allopurinol; Aspirin; and Fenofibrate   Social History   Socioeconomic History  . Marital status: Married    Spouse name: Not on file  . Number of children: Not on file  . Years of education: Not on file  . Highest education level: Not on file  Occupational History  . Not on file  Tobacco Use  . Smoking status: Former Research scientist (life sciences)  . Smokeless tobacco: Never Used  Vaping Use  . Vaping Use: Never used  Substance and Sexual Activity  . Alcohol use: No  . Drug use: No  . Sexual activity: Not on file  Other Topics  Concern  . Not on file  Social History Narrative  . Not on file   Social Determinants of Health   Financial Resource Strain: Not on file  Food Insecurity: Not on file  Transportation Needs: Not on file  Physical Activity: Not on file  Stress: Not on file  Social Connections: Not on file     Family History: The patient's family history includes CAD in her father and mother; Diabetes in her father and mother; Stroke in her mother. ROS:   Please see the history of present illness.    All other systems reviewed and are negative.  EKGs/Labs/Other Studies Reviewed:    The following studies were reviewed today:  EKG:  EKG ordered today and personally reviewed.  The ekg ordered today demonstrates sinus bradycardia 53 bpm otherwise normal  Physical Exam:    VS:  BP 120/68 (BP Location: Left Arm, Patient Position: Sitting, Cuff Size: Normal)   Pulse (!) 58   Ht 5\' 6"  (1.676 m)   Wt 174 lb (78.9 kg)   SpO2 98%   BMI 28.08 kg/m     Wt Readings from Last 3 Encounters:  06/14/20 174 lb (78.9 kg)  12/15/19 170 lb 3.2 oz (77.2 kg)  09/15/19 168 lb 12.8 oz (76.6 kg)     GEN: She no longer looks frail well nourished, well developed in no acute distress HEENT: Normal NECK: No JVD; No carotid bruits LYMPHATICS: No lymphadenopathy CARDIAC: RRR, no murmurs, rubs, gallops RESPIRATORY:  Clear to auscultation without rales, wheezing or rhonchi  ABDOMEN: Soft, non-tender, non-distended MUSCULOSKELETAL:  No edema; No deformity  SKIN: Warm and dry NEUROLOGIC:  Alert and oriented x 3 PSYCHIATRIC:  Normal affect    Signed, Shirlee More, MD  06/14/2020 11:31 AM    [place]

## 2020-06-14 ENCOUNTER — Encounter: Payer: Self-pay | Admitting: Cardiology

## 2020-06-14 ENCOUNTER — Other Ambulatory Visit: Payer: Self-pay

## 2020-06-14 ENCOUNTER — Ambulatory Visit (INDEPENDENT_AMBULATORY_CARE_PROVIDER_SITE_OTHER): Payer: PPO | Admitting: Cardiology

## 2020-06-14 VITALS — BP 120/68 | HR 58 | Ht 66.0 in | Wt 174.0 lb

## 2020-06-14 DIAGNOSIS — I110 Hypertensive heart disease with heart failure: Secondary | ICD-10-CM

## 2020-06-14 DIAGNOSIS — E871 Hypo-osmolality and hyponatremia: Secondary | ICD-10-CM | POA: Diagnosis not present

## 2020-06-14 DIAGNOSIS — E782 Mixed hyperlipidemia: Secondary | ICD-10-CM

## 2020-06-14 DIAGNOSIS — I2510 Atherosclerotic heart disease of native coronary artery without angina pectoris: Secondary | ICD-10-CM

## 2020-06-14 DIAGNOSIS — I10 Essential (primary) hypertension: Secondary | ICD-10-CM

## 2020-06-14 NOTE — Patient Instructions (Signed)

## 2020-06-28 DIAGNOSIS — H524 Presbyopia: Secondary | ICD-10-CM | POA: Diagnosis not present

## 2020-06-28 DIAGNOSIS — H04123 Dry eye syndrome of bilateral lacrimal glands: Secondary | ICD-10-CM | POA: Diagnosis not present

## 2020-06-28 DIAGNOSIS — E113292 Type 2 diabetes mellitus with mild nonproliferative diabetic retinopathy without macular edema, left eye: Secondary | ICD-10-CM | POA: Diagnosis not present

## 2020-06-28 DIAGNOSIS — H52223 Regular astigmatism, bilateral: Secondary | ICD-10-CM | POA: Diagnosis not present

## 2020-06-28 DIAGNOSIS — E113211 Type 2 diabetes mellitus with mild nonproliferative diabetic retinopathy with macular edema, right eye: Secondary | ICD-10-CM | POA: Diagnosis not present

## 2020-06-28 DIAGNOSIS — H43813 Vitreous degeneration, bilateral: Secondary | ICD-10-CM | POA: Diagnosis not present

## 2020-08-15 DIAGNOSIS — D649 Anemia, unspecified: Secondary | ICD-10-CM | POA: Diagnosis not present

## 2020-08-15 DIAGNOSIS — K5790 Diverticulosis of intestine, part unspecified, without perforation or abscess without bleeding: Secondary | ICD-10-CM | POA: Diagnosis not present

## 2020-08-15 DIAGNOSIS — R109 Unspecified abdominal pain: Secondary | ICD-10-CM | POA: Diagnosis not present

## 2020-08-15 DIAGNOSIS — R195 Other fecal abnormalities: Secondary | ICD-10-CM | POA: Diagnosis not present

## 2020-10-18 DIAGNOSIS — I420 Dilated cardiomyopathy: Secondary | ICD-10-CM | POA: Diagnosis not present

## 2020-10-18 DIAGNOSIS — E119 Type 2 diabetes mellitus without complications: Secondary | ICD-10-CM | POA: Diagnosis not present

## 2020-10-18 DIAGNOSIS — I161 Hypertensive emergency: Secondary | ICD-10-CM | POA: Diagnosis not present

## 2020-10-18 DIAGNOSIS — K219 Gastro-esophageal reflux disease without esophagitis: Secondary | ICD-10-CM | POA: Diagnosis not present

## 2020-10-18 DIAGNOSIS — I6523 Occlusion and stenosis of bilateral carotid arteries: Secondary | ICD-10-CM | POA: Diagnosis not present

## 2020-10-18 DIAGNOSIS — I2510 Atherosclerotic heart disease of native coronary artery without angina pectoris: Secondary | ICD-10-CM | POA: Diagnosis not present

## 2020-10-18 DIAGNOSIS — R2990 Unspecified symptoms and signs involving the nervous system: Secondary | ICD-10-CM | POA: Diagnosis not present

## 2020-10-18 DIAGNOSIS — R4701 Aphasia: Secondary | ICD-10-CM | POA: Diagnosis not present

## 2020-10-18 DIAGNOSIS — I509 Heart failure, unspecified: Secondary | ICD-10-CM | POA: Diagnosis not present

## 2020-10-18 DIAGNOSIS — I6521 Occlusion and stenosis of right carotid artery: Secondary | ICD-10-CM | POA: Diagnosis not present

## 2020-10-18 DIAGNOSIS — G459 Transient cerebral ischemic attack, unspecified: Secondary | ICD-10-CM | POA: Diagnosis not present

## 2020-10-18 DIAGNOSIS — B91 Sequelae of poliomyelitis: Secondary | ICD-10-CM | POA: Diagnosis not present

## 2020-10-18 DIAGNOSIS — I701 Atherosclerosis of renal artery: Secondary | ICD-10-CM | POA: Diagnosis not present

## 2020-10-18 DIAGNOSIS — Z853 Personal history of malignant neoplasm of breast: Secondary | ICD-10-CM | POA: Diagnosis not present

## 2020-10-18 DIAGNOSIS — J3489 Other specified disorders of nose and nasal sinuses: Secondary | ICD-10-CM | POA: Diagnosis not present

## 2020-10-18 DIAGNOSIS — Z8249 Family history of ischemic heart disease and other diseases of the circulatory system: Secondary | ICD-10-CM | POA: Diagnosis not present

## 2020-10-18 DIAGNOSIS — Z823 Family history of stroke: Secondary | ICD-10-CM | POA: Diagnosis not present

## 2020-10-18 DIAGNOSIS — Z888 Allergy status to other drugs, medicaments and biological substances status: Secondary | ICD-10-CM | POA: Diagnosis not present

## 2020-10-18 DIAGNOSIS — R9431 Abnormal electrocardiogram [ECG] [EKG]: Secondary | ICD-10-CM | POA: Diagnosis not present

## 2020-10-18 DIAGNOSIS — I517 Cardiomegaly: Secondary | ICD-10-CM | POA: Diagnosis not present

## 2020-10-18 DIAGNOSIS — I160 Hypertensive urgency: Secondary | ICD-10-CM | POA: Diagnosis not present

## 2020-10-18 DIAGNOSIS — E871 Hypo-osmolality and hyponatremia: Secondary | ICD-10-CM | POA: Diagnosis not present

## 2020-10-18 DIAGNOSIS — I6522 Occlusion and stenosis of left carotid artery: Secondary | ICD-10-CM | POA: Diagnosis not present

## 2020-10-18 DIAGNOSIS — M109 Gout, unspecified: Secondary | ICD-10-CM | POA: Diagnosis not present

## 2020-10-18 DIAGNOSIS — Z8673 Personal history of transient ischemic attack (TIA), and cerebral infarction without residual deficits: Secondary | ICD-10-CM | POA: Diagnosis not present

## 2020-10-18 DIAGNOSIS — R29702 NIHSS score 2: Secondary | ICD-10-CM | POA: Diagnosis not present

## 2020-10-18 DIAGNOSIS — E785 Hyperlipidemia, unspecified: Secondary | ICD-10-CM | POA: Diagnosis not present

## 2020-10-18 DIAGNOSIS — I10 Essential (primary) hypertension: Secondary | ICD-10-CM | POA: Diagnosis not present

## 2020-10-18 DIAGNOSIS — Z01810 Encounter for preprocedural cardiovascular examination: Secondary | ICD-10-CM | POA: Diagnosis not present

## 2020-10-18 DIAGNOSIS — Z7984 Long term (current) use of oral hypoglycemic drugs: Secondary | ICD-10-CM | POA: Diagnosis not present

## 2020-10-18 DIAGNOSIS — Z974 Presence of external hearing-aid: Secondary | ICD-10-CM | POA: Diagnosis not present

## 2020-10-18 DIAGNOSIS — G319 Degenerative disease of nervous system, unspecified: Secondary | ICD-10-CM | POA: Diagnosis not present

## 2020-10-18 DIAGNOSIS — Z79899 Other long term (current) drug therapy: Secondary | ICD-10-CM | POA: Diagnosis not present

## 2020-10-18 DIAGNOSIS — R202 Paresthesia of skin: Secondary | ICD-10-CM | POA: Diagnosis not present

## 2020-10-18 DIAGNOSIS — Z87891 Personal history of nicotine dependence: Secondary | ICD-10-CM | POA: Diagnosis not present

## 2020-10-18 DIAGNOSIS — Z7902 Long term (current) use of antithrombotics/antiplatelets: Secondary | ICD-10-CM | POA: Diagnosis not present

## 2020-10-18 DIAGNOSIS — Z9049 Acquired absence of other specified parts of digestive tract: Secondary | ICD-10-CM | POA: Diagnosis not present

## 2020-10-18 DIAGNOSIS — R4781 Slurred speech: Secondary | ICD-10-CM | POA: Diagnosis not present

## 2020-10-18 DIAGNOSIS — I110 Hypertensive heart disease with heart failure: Secondary | ICD-10-CM | POA: Diagnosis not present

## 2020-10-23 NOTE — Progress Notes (Signed)
[facility]  [address] [place],  [place]  [address] [phone]  Clinic Day:  10/29/2020  Referring physician: Myrlene Broker, MD  This document serves as a record of services personally performed by Hollynn Garno Macarthur Critchley, MD. It was created on their behalf by [facility], a trained medical scribe. The creation of this record is based on the scribe's personal observations and the provider's statements to them.  HISTORY OF PRESENT ILLNESS:  The patient is an 87 y.o. female with hormone positive DCIS, status post a lumpectomy in May 2015. Of note, the patient elected not to undergo adjuvant breast radiation.  She briefly took tamoxifen, but discontinued it due to it causing numerous side effects, including undesirable weight gain and joint discomfort.  She comes in today for routine follow-up. Since her last visit, the patient has been doing okay.  From a breast cancer perspective, she denies having any particular changes with her breasts which concern her for disease recurrence.  Of note, her annual mammogram in May 2022 continued to show no evidence of disease recurrence.  VITALS:  Blood pressure (!) 179/88, pulse 66, temperature 98.3 F (36.8 C), resp. rate 16, height 5\' 6"  (1.676 m), weight 171 lb (77.6 kg), SpO2 97 %.  Wt Readings from Last 3 Encounters:  10/29/20 171 lb (77.6 kg)  06/14/20 174 lb (78.9 kg)  12/15/19 170 lb 3.2 oz (77.2 kg)    Body mass index is 27.6 kg/m.  Performance status (ECOG): 1 - Symptomatic but completely ambulatory  PHYSICAL EXAM:  Physical Exam Constitutional:      Appearance: Normal appearance.  HENT:     Mouth/Throat:     Pharynx: Oropharynx is clear. No oropharyngeal exudate.  Cardiovascular:     Rate and Rhythm: Normal rate and regular rhythm.     Heart sounds: No murmur heard.   No friction rub. No gallop.  Pulmonary:     Breath sounds: Normal breath sounds.  Chest:  Breasts:    Right: No swelling,  bleeding, inverted nipple, mass, nipple discharge or skin change.     Left: No swelling, bleeding, inverted nipple, mass, nipple discharge or skin change.  Abdominal:     General: Bowel sounds are normal. There is no distension.     Palpations: Abdomen is soft. There is no mass.     Tenderness: There is no abdominal tenderness.  Musculoskeletal:        General: No tenderness.     Cervical back: Normal range of motion and neck supple.     Right lower leg: No edema.     Left lower leg: No edema.  Lymphadenopathy:     Cervical: No cervical adenopathy.     Right cervical: No superficial, deep or posterior cervical adenopathy.    Left cervical: No superficial, deep or posterior cervical adenopathy.     Upper Body:     Right upper body: No supraclavicular or axillary adenopathy.     Left upper body: No supraclavicular or axillary adenopathy.     Lower Body: No right inguinal adenopathy. No left inguinal adenopathy.  Skin:    Coloration: Skin is not jaundiced.     Findings: No lesion or rash.  Neurological:     General: No focal deficit present.     Mental Status: She is alert and oriented to person, place, and time. Mental status is at baseline.  Psychiatric:        Mood and Affect: Mood normal.  Behavior: Behavior normal.        Thought Content: Thought content normal.        Judgment: Judgment normal.    ASSESSMENT & PLAN:  Assessment:  An 87 y.o. female with hormone positive DCIS, who is now over approaches 7-1/2 years out from her lumpectomy.  Based upon her clinical breast exam today and recent mammogram, the patient remains disease-free.  Clinically, she is doing well.  Per her request, she wishes to be seen by only 1 physician for her breast cancer surveillance.  As Dr. Noberto Retort has been ordering her annual mammograms, she wishes to follow with him only.  I have no problem with her doing this.  She understands her overall prognosis, as it pertains to her breast cancer, is  excellent.  I would not have a problem seeing her in the future if new oncologic issues arise that require repeat clinical assessment.   The patient understands all the plans discussed today and is in agreement with them.   I, Rita Ohara, am acting as scribe for Marice Potter, MD    I have reviewed this report as typed by the medical scribe, and it is complete and accurate.  Narelle Schoening Macarthur Critchley, MD

## 2020-10-28 DIAGNOSIS — E669 Obesity, unspecified: Secondary | ICD-10-CM | POA: Diagnosis not present

## 2020-10-28 DIAGNOSIS — M109 Gout, unspecified: Secondary | ICD-10-CM | POA: Diagnosis not present

## 2020-10-28 DIAGNOSIS — E871 Hypo-osmolality and hyponatremia: Secondary | ICD-10-CM | POA: Diagnosis not present

## 2020-10-28 DIAGNOSIS — F5104 Psychophysiologic insomnia: Secondary | ICD-10-CM | POA: Diagnosis not present

## 2020-10-28 DIAGNOSIS — E1122 Type 2 diabetes mellitus with diabetic chronic kidney disease: Secondary | ICD-10-CM | POA: Diagnosis not present

## 2020-10-28 DIAGNOSIS — E782 Mixed hyperlipidemia: Secondary | ICD-10-CM | POA: Diagnosis not present

## 2020-10-28 DIAGNOSIS — N1831 Chronic kidney disease, stage 3a: Secondary | ICD-10-CM | POA: Diagnosis not present

## 2020-10-28 DIAGNOSIS — D509 Iron deficiency anemia, unspecified: Secondary | ICD-10-CM | POA: Diagnosis not present

## 2020-10-28 DIAGNOSIS — F4323 Adjustment disorder with mixed anxiety and depressed mood: Secondary | ICD-10-CM | POA: Diagnosis not present

## 2020-10-28 DIAGNOSIS — I130 Hypertensive heart and chronic kidney disease with heart failure and stage 1 through stage 4 chronic kidney disease, or unspecified chronic kidney disease: Secondary | ICD-10-CM | POA: Diagnosis not present

## 2020-10-28 DIAGNOSIS — D631 Anemia in chronic kidney disease: Secondary | ICD-10-CM | POA: Diagnosis not present

## 2020-10-28 DIAGNOSIS — I2510 Atherosclerotic heart disease of native coronary artery without angina pectoris: Secondary | ICD-10-CM | POA: Diagnosis not present

## 2020-10-28 DIAGNOSIS — I429 Cardiomyopathy, unspecified: Secondary | ICD-10-CM | POA: Diagnosis not present

## 2020-10-28 DIAGNOSIS — Z6831 Body mass index (BMI) 31.0-31.9, adult: Secondary | ICD-10-CM | POA: Diagnosis not present

## 2020-10-28 DIAGNOSIS — Z853 Personal history of malignant neoplasm of breast: Secondary | ICD-10-CM | POA: Diagnosis not present

## 2020-10-28 DIAGNOSIS — I5042 Chronic combined systolic (congestive) and diastolic (congestive) heart failure: Secondary | ICD-10-CM | POA: Diagnosis not present

## 2020-10-28 DIAGNOSIS — E038 Other specified hypothyroidism: Secondary | ICD-10-CM | POA: Diagnosis not present

## 2020-10-28 DIAGNOSIS — J3089 Other allergic rhinitis: Secondary | ICD-10-CM | POA: Diagnosis not present

## 2020-10-28 DIAGNOSIS — Z87891 Personal history of nicotine dependence: Secondary | ICD-10-CM | POA: Diagnosis not present

## 2020-10-28 DIAGNOSIS — H8110 Benign paroxysmal vertigo, unspecified ear: Secondary | ICD-10-CM | POA: Diagnosis not present

## 2020-10-28 DIAGNOSIS — Z9181 History of falling: Secondary | ICD-10-CM | POA: Diagnosis not present

## 2020-10-28 DIAGNOSIS — G459 Transient cerebral ischemic attack, unspecified: Secondary | ICD-10-CM | POA: Diagnosis not present

## 2020-10-28 DIAGNOSIS — K219 Gastro-esophageal reflux disease without esophagitis: Secondary | ICD-10-CM | POA: Diagnosis not present

## 2020-10-28 DIAGNOSIS — M5030 Other cervical disc degeneration, unspecified cervical region: Secondary | ICD-10-CM | POA: Diagnosis not present

## 2020-10-29 ENCOUNTER — Telehealth: Payer: Self-pay | Admitting: Oncology

## 2020-10-29 ENCOUNTER — Inpatient Hospital Stay: Payer: BC Managed Care – PPO | Attending: Oncology | Admitting: Oncology

## 2020-10-29 VITALS — BP 179/88 | HR 66 | Temp 98.3°F | Resp 16 | Ht 66.0 in | Wt 171.0 lb

## 2020-10-29 DIAGNOSIS — D0512 Intraductal carcinoma in situ of left breast: Secondary | ICD-10-CM | POA: Diagnosis not present

## 2020-10-29 NOTE — Telephone Encounter (Signed)
Per 10/18 LOS, patient discharged from clinic

## 2020-11-01 DIAGNOSIS — I10 Essential (primary) hypertension: Secondary | ICD-10-CM | POA: Diagnosis not present

## 2020-11-01 DIAGNOSIS — F325 Major depressive disorder, single episode, in full remission: Secondary | ICD-10-CM | POA: Insufficient documentation

## 2020-11-01 DIAGNOSIS — E782 Mixed hyperlipidemia: Secondary | ICD-10-CM | POA: Diagnosis not present

## 2020-11-01 DIAGNOSIS — Z0000 Encounter for general adult medical examination without abnormal findings: Secondary | ICD-10-CM | POA: Diagnosis not present

## 2020-11-01 DIAGNOSIS — Z8673 Personal history of transient ischemic attack (TIA), and cerebral infarction without residual deficits: Secondary | ICD-10-CM | POA: Diagnosis not present

## 2020-11-01 DIAGNOSIS — E559 Vitamin D deficiency, unspecified: Secondary | ICD-10-CM

## 2020-11-01 DIAGNOSIS — E1165 Type 2 diabetes mellitus with hyperglycemia: Secondary | ICD-10-CM | POA: Diagnosis not present

## 2020-11-01 DIAGNOSIS — Z87891 Personal history of nicotine dependence: Secondary | ICD-10-CM | POA: Diagnosis not present

## 2020-11-01 DIAGNOSIS — K219 Gastro-esophageal reflux disease without esophagitis: Secondary | ICD-10-CM | POA: Diagnosis not present

## 2020-11-01 DIAGNOSIS — Z7984 Long term (current) use of oral hypoglycemic drugs: Secondary | ICD-10-CM | POA: Diagnosis not present

## 2020-11-01 HISTORY — DX: Major depressive disorder, single episode, in full remission: F32.5

## 2020-11-01 HISTORY — DX: Vitamin D deficiency, unspecified: E55.9

## 2020-11-02 DIAGNOSIS — S81812A Laceration without foreign body, left lower leg, initial encounter: Secondary | ICD-10-CM | POA: Diagnosis not present

## 2020-11-08 DIAGNOSIS — G459 Transient cerebral ischemic attack, unspecified: Secondary | ICD-10-CM | POA: Diagnosis not present
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
## 2020-12-12 ENCOUNTER — Encounter: Payer: Self-pay | Admitting: Cardiology

## 2020-12-12 ENCOUNTER — Other Ambulatory Visit: Payer: Self-pay

## 2020-12-12 ENCOUNTER — Ambulatory Visit (INDEPENDENT_AMBULATORY_CARE_PROVIDER_SITE_OTHER): Payer: PPO | Admitting: Cardiology

## 2020-12-12 VITALS — BP 180/72 | HR 64 | Ht 66.0 in | Wt 171.0 lb

## 2020-12-12 DIAGNOSIS — I2510 Atherosclerotic heart disease of native coronary artery without angina pectoris: Secondary | ICD-10-CM

## 2020-12-12 DIAGNOSIS — E871 Hypo-osmolality and hyponatremia: Secondary | ICD-10-CM

## 2020-12-12 DIAGNOSIS — I110 Hypertensive heart disease with heart failure: Secondary | ICD-10-CM

## 2020-12-12 DIAGNOSIS — E782 Mixed hyperlipidemia: Secondary | ICD-10-CM | POA: Diagnosis not present

## 2020-12-12 MED ORDER — CLONIDINE HCL 0.1 MG PO TABS: 0.1000 mg | ORAL_TABLET | Freq: Every day | ORAL | 3 refills | Status: DC | PRN

## 2020-12-12 NOTE — Patient Instructions (Signed)
Medication Instructions:  Your physician has recommended you make the following change in your medication:  START: Clonidine 0.1 mg take one tablet by mouth daily as needed. Only take if your blood pressure is >190 on top.  *If you need a refill on your cardiac medications before your next appointment, please call your pharmacy*   Lab Work: Your physician recommends that you return for lab work in: TODAY BMP If you have labs (blood work) drawn today and your tests are completely normal, you will receive your results only by: [place] (if you have MyChart) OR A paper copy in the mail If you have any lab test that is abnormal or we need to change your treatment, we will call you to review the results.   Testing/Procedures: None   Follow-Up: At [facility], you and your health needs are our priority.  As part of our continuing mission to provide you with exceptional heart care, we have created designated Provider Care Teams.  These Care Teams include your primary Cardiologist (physician) and Advanced Practice Providers (APPs -  Physician Assistants and Nurse Practitioners) who all work together to provide you with the care you need, when you need it.  We recommend signing up for the patient portal called "MyChart".  Sign up information is provided on this After Visit Summary.  MyChart is used to connect with patients for Virtual Visits (Telemedicine).  Patients are able to view lab/test results, encounter notes, upcoming appointments, etc.  Non-urgent messages can be sent to your provider as well.   To learn more about what you can do with MyChart, go to [email or web address].    Your next appointment:   6 month(s)  The format for your next appointment:   In Person  Provider:   Shirlee More, MD    Other Instructions

## 2020-12-13 ENCOUNTER — Telehealth: Payer: Self-pay

## 2020-12-13 LAB — BASIC METABOLIC PANEL
BUN/Creatinine Ratio: 20 (ref 12–28)
BUN: 24 mg/dL (ref 8–27)
CO2: 20 mmol/L (ref 20–29)
Calcium: 9.6 mg/dL (ref 8.7–10.3)
Chloride: 98 mmol/L (ref 96–106)
Creatinine, Ser: 1.19 mg/dL — ABNORMAL HIGH (ref 0.57–1.00)
Glucose: 104 mg/dL — ABNORMAL HIGH (ref 70–99)
Potassium: 5 mmol/L (ref 3.5–5.2)
Sodium: 135 mmol/L (ref 134–144)
eGFR: 45 mL/min/{1.73_m2} — ABNORMAL LOW (ref >59)
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
## 2020-12-13 NOTE — Telephone Encounter (Signed)
-----   Message from Richardo Priest, MD sent at 12/13/2020 12:17 PM EST ----- Normal or stable result

## 2021-01-21 DIAGNOSIS — I6389 Other cerebral infarction: Secondary | ICD-10-CM | POA: Diagnosis not present

## 2021-01-26 DIAGNOSIS — I480 Paroxysmal atrial fibrillation: Secondary | ICD-10-CM | POA: Diagnosis not present

## 2021-01-27 DIAGNOSIS — I10 Essential (primary) hypertension: Secondary | ICD-10-CM

## 2021-01-27 DIAGNOSIS — N1831 Chronic kidney disease, stage 3a: Secondary | ICD-10-CM

## 2021-01-27 DIAGNOSIS — I2510 Atherosclerotic heart disease of native coronary artery without angina pectoris: Secondary | ICD-10-CM | POA: Diagnosis not present

## 2021-01-27 DIAGNOSIS — E785 Hyperlipidemia, unspecified: Secondary | ICD-10-CM | POA: Diagnosis not present

## 2021-01-27 DIAGNOSIS — I739 Peripheral vascular disease, unspecified: Secondary | ICD-10-CM | POA: Diagnosis not present

## 2021-01-27 DIAGNOSIS — I480 Paroxysmal atrial fibrillation: Secondary | ICD-10-CM | POA: Diagnosis not present

## 2021-01-28 DIAGNOSIS — I480 Paroxysmal atrial fibrillation: Secondary | ICD-10-CM | POA: Diagnosis not present

## 2021-01-28 DIAGNOSIS — E785 Hyperlipidemia, unspecified: Secondary | ICD-10-CM | POA: Diagnosis not present

## 2021-01-28 DIAGNOSIS — I2510 Atherosclerotic heart disease of native coronary artery without angina pectoris: Secondary | ICD-10-CM | POA: Diagnosis not present

## 2021-01-28 DIAGNOSIS — I739 Peripheral vascular disease, unspecified: Secondary | ICD-10-CM | POA: Diagnosis not present

## 2021-02-14 ENCOUNTER — Telehealth: Payer: Self-pay

## 2021-02-14 NOTE — Telephone Encounter (Signed)
Patient come by the office today to let us know that her PCP started her on Eliquis 2.5 mg BID. However, the pharmacy is unable to get this medication for her until Thursday. Per ok with Dr. Bettina Gavia we gave her samples to get her through until then.    Encouraged patient to call back with any questions or concerns.

## 2021-02-18 NOTE — Progress Notes (Signed)
Cardiology Office Note:    Date:  02/19/2021   ID:  Marilyn Robinson, DOB 09/20/1933, MRN [id number]  PCP:  Marilyn Broker, MD  Cardiologist:  Marilyn More, MD    Referring MD: Marilyn Broker, MD    ASSESSMENT:    1. Persistent atrial fibrillation ([place])   2. Hypertensive heart disease with heart failure ([place])   3. Mild CAD   4. Mixed hyperlipidemia   5. Hyponatremia   6. Cerebrovascular accident (CVA), unspecified mechanism ([place])    PLAN:    In order of problems listed above:  Fortunately she has recovered from her stroke back to her previous level of function independent and today is back in sinus rhythm had documented atrial fibrillation at [facility] controlled ventricular rate.  Very difficult decision how to manage it with recurrent GI bleeding before I saw her in the hospital she was receiving Plavix every other day which is ineffective she has documented atrial fibrillation and she is tolerating reduced dose anticoagulant because of her age and at least when she was in the hospital element of renal insufficiency.  Long-term I think she would benefit from [place] but her age is a consideration I will ask my EP colleague to consider her. Stable BP at target she has had recurrent severe hyponatremia we will recheck her electrolytes today heart failure is compensated and continue her low-dose diuretic BP is controlled in view of her age and recent stroke goal systolic 1 2585 continue multidrug regimen including carvedilol clonidine as needed she tracks blood pressure at home hydralazine and ARB. She made good recovery from her stroke continue her high intensity statin   Next appointment: 3 months   Medication Adjustments/Labs and Tests Ordered: Current medicines are reviewed at length with the patient today.  Concerns regarding medicines are outlined above.  No orders of the defined types were placed in this encounter.  No orders of the defined types were placed in  this encounter.   Chief Complaint  Patient presents with   Follow-up  Recent stroke seen by me at [facility] found to have atrial fibrillation and after stabilization was initiated on anticoagulation, she was at rehab.  History of Present Illness:    Marilyn Robinson is a 88 y.o. female with a hx of hypertensive heart disease with heart failure coronary artery disease dyslipidemia recurrent symptomatic hyponatremia iron deficiency and history of lacunar stroke last seen 12/12/2020.  Subsequently she had a recurrent stroke cerebellar and had paroxysmal atrial fibrillation documented during her stay at [facility].  She had a history of recurrent GI bleeding and was placed on a submaximal dose of clopidogrel and I advised in the hospital that she should be anticoagulated in view of age with half dose Eliquis.  As an outpatient despite her age could consider Watchman device and she is a poor candidate for long-term anticoagulation.  She had an echocardiogram with agitated saline contrast that showed no evidence of shunt or ventricular thrombus.  She also had bilateral 60 to 79% ICA stenosis by duplex.  CTA showed near complete occlusive thrombus vertebral artery at the origin of the left PICA.  Compliance with diet, lifestyle and medications: Yes  [name] has improved she came home from rehab and is back to her previous level of function and recovered from her severe vertigo headache and disequilibrium with cerebellar stroke. She was placed on her anticoagulant at rehab she was receiving 5 mg and reduced to 2.5 mg BID at discharge.  She is not having edema shortness of breath chest pain palpitation or syncope and her blood pressure is running 1 22-2 50 systolic at home and has been her goal poststroke  She is a poor candidate for long-term anticoagulation with recurrent GI bleeding and despite her age I am going to ask Marilyn Robinson to consider Watchman device. Past Medical History:   Diagnosis Date   Anemia 07/02/2016   Bilateral carotid artery stenosis 09/22/2015   BMI 31.0-31.9,adult 05/06/2015   Breast cancer (HCC)    Chronic combined systolic and diastolic heart failure ([place]) 07/17/2014   Overview:  EF 56% 09/23/10   Diabetes mellitus without complication (HCC)    Dislocation of right knee with lateral meniscus tear 03/15/2014   Ductal carcinoma in situ (DCIS) of left breast 05/06/2015   GERD (gastroesophageal reflux disease)    Hyperlipidemia 07/17/2014   Hypertension    Hypertensive heart disease with heart failure ([place]) 07/17/2014   Hypertensive urgency 09/22/2015   Hypokalemia 09/22/2015   Hyponatremia 05/29/2015   Mild CAD 07/17/2014   Overview:  50% RCA stenosis in 2005   Non morbid obesity 05/06/2015   Pyuria 09/22/2015   Right knee pain 05/17/2014   S/P arthroscopy of knee 05/17/2014   Stroke-like episode 05/29/2015   Stroke-like symptoms 05/29/2015   TIA (transient ischemic attack)    Uterine prolapse 07/02/2016    Past Surgical History:  Procedure Laterality Date   BREAST BIOPSY     CATARACT EXTRACTION     CHOLECYSTECTOMY     CORONARY ANGIOPLASTY WITH STENT PLACEMENT     EYE SURGERY     TUBAL LIGATION      Current Medications: No outpatient medications have been marked as taking for the 02/19/21 encounter (Office Visit) with Richardo Priest, MD.     Allergies:   Antihistamines, diphenhydramine-type; Lipitor [atorvastatin]; Penicillins; Allopurinol; Aspirin; and Fenofibrate   Social History   Socioeconomic History   Marital status: Married    Spouse name: Not on file   Number of children: Not on file   Years of education: Not on file   Highest education level: Not on file  Occupational History   Not on file  Tobacco Use   Smoking status: Former   Smokeless tobacco: Never  Vaping Use   Vaping Use: Never used  Substance and Sexual Activity   Alcohol use: No   Drug use: No   Sexual activity: Not on file  Other Topics Concern   Not on file  Social  History Narrative   Not on file   Social Determinants of Health   Financial Resource Strain: Not on file  Food Insecurity: Not on file  Transportation Needs: Not on file  Physical Activity: Not on file  Stress: Not on file  Social Connections: Not on file     Family History: The patient's family history includes CAD in her father and mother; Diabetes in her father and mother; Stroke in her mother. ROS:   Please see the history of present illness.    All other systems reviewed and are negative.  EKGs/Labs/Other Studies Reviewed:    The following studies were reviewed today:  EKG:  EKG ordered today and personally reviewed.  The ekg ordered today demonstrates sinus rhythm  Recent Labs: 12/12/2020: BUN 24; Creatinine, Ser 1.19; Potassium 5.0; Sodium 135  Recent Lipid Panel    Component Value Date/Time   CHOL 119 08/19/2016 1042   TRIG 107 08/19/2016 1042   HDL 41 08/19/2016 1042  CHOLHDL 3.3 05/29/2015 1549   VLDL 22 05/29/2015 1549   LDLCALC 57 08/19/2016 1042    Physical Exam:    VS:  Ht 5\' 6"  (1.676 m)    Wt 164 lb (74.4 kg)    BMI 26.47 kg/m     Wt Readings from Last 3 Encounters:  02/19/21 164 lb (74.4 kg)  12/12/20 171 lb (77.6 kg)  10/29/20 171 lb (77.6 kg)     GEN:  Well nourished, well developed in no acute distress HEENT: Normal NECK: No JVD; No carotid bruits LYMPHATICS: No lymphadenopathy CARDIAC: RRR, no murmurs, rubs, gallops RESPIRATORY:  Clear to auscultation without rales, wheezing or rhonchi  ABDOMEN: Soft, non-tender, non-distended MUSCULOSKELETAL:  No edema; No deformity  SKIN: Warm and dry NEUROLOGIC:  Alert and oriented x 3 PSYCHIATRIC:  Normal affect    Signed, Marilyn More, MD  02/19/2021 7:43 AM    [place]

## 2021-02-19 ENCOUNTER — Encounter: Payer: Self-pay | Admitting: Cardiology

## 2021-02-19 ENCOUNTER — Other Ambulatory Visit: Payer: Self-pay

## 2021-02-19 ENCOUNTER — Ambulatory Visit (INDEPENDENT_AMBULATORY_CARE_PROVIDER_SITE_OTHER): Payer: PPO | Admitting: Cardiology

## 2021-02-19 VITALS — BP 150/60 | HR 60 | Ht 66.0 in | Wt 164.0 lb

## 2021-02-19 DIAGNOSIS — E871 Hypo-osmolality and hyponatremia: Secondary | ICD-10-CM

## 2021-02-19 DIAGNOSIS — I4819 Other persistent atrial fibrillation: Secondary | ICD-10-CM

## 2021-02-19 DIAGNOSIS — I110 Hypertensive heart disease with heart failure: Secondary | ICD-10-CM | POA: Diagnosis not present

## 2021-02-19 DIAGNOSIS — I639 Cerebral infarction, unspecified: Secondary | ICD-10-CM

## 2021-02-19 DIAGNOSIS — E782 Mixed hyperlipidemia: Secondary | ICD-10-CM

## 2021-02-19 DIAGNOSIS — I2510 Atherosclerotic heart disease of native coronary artery without angina pectoris: Secondary | ICD-10-CM

## 2021-02-19 HISTORY — DX: Cerebral infarction, unspecified: I63.9

## 2021-02-19 NOTE — Patient Instructions (Signed)
Medication Instructions:  Your physician recommends that you continue on your current medications as directed. Please refer to the Current Medication list given to you today.  *If you need a refill on your cardiac medications before your next appointment, please call your pharmacy*   Lab Work: Your physician recommends that you return for lab work in: Labs today: BMP, CBC If you have labs (blood work) drawn today and your tests are completely normal, you will receive your results only by: MyChart Message (if you have MyChart) OR A paper copy in the mail If you have any lab test that is abnormal or we need to change your treatment, we will call you to review the results.   Testing/Procedures: None   Follow-Up: At [facility], you and your health needs are our priority.  As part of our continuing mission to provide you with exceptional heart care, we have created designated Provider Care Teams.  These Care Teams include your primary Cardiologist (physician) and Advanced Practice Providers (APPs -  Physician Assistants and Nurse Practitioners) who all work together to provide you with the care you need, when you need it.  We recommend signing up for the patient portal called "MyChart".  Sign up information is provided on this After Visit Summary.  MyChart is used to connect with patients for Virtual Visits (Telemedicine).  Patients are able to view lab/test results, encounter notes, upcoming appointments, etc.  Non-urgent messages can be sent to your provider as well.   To learn more about what you can do with MyChart, go to [email or web address].    Your next appointment:   3 month(s)  The format for your next appointment:   In Person  Provider:   Shirlee More, MD    Other Instructions None

## 2021-02-20 ENCOUNTER — Telehealth: Payer: Self-pay

## 2021-02-20 DIAGNOSIS — I4819 Other persistent atrial fibrillation: Secondary | ICD-10-CM

## 2021-02-20 LAB — BASIC METABOLIC PANEL
BUN/Creatinine Ratio: 14 (ref 12–28)
BUN: 18 mg/dL (ref 8–27)
CO2: 16 mmol/L — ABNORMAL LOW (ref 20–29)
Calcium: 8.8 mg/dL (ref 8.7–10.3)
Chloride: 106 mmol/L (ref 96–106)
Creatinine, Ser: 1.29 mg/dL — ABNORMAL HIGH (ref 0.57–1.00)
Glucose: 197 mg/dL — ABNORMAL HIGH (ref 70–99)
Potassium: 4.5 mmol/L (ref 3.5–5.2)
Sodium: 136 mmol/L (ref 134–144)
eGFR: 41 mL/min/{1.73_m2} — ABNORMAL LOW (ref >59)

## 2021-02-20 LAB — CBC
Hematocrit: 29.1 % — ABNORMAL LOW (ref 34.0–46.6)
Hemoglobin: 9.4 g/dL — ABNORMAL LOW (ref 11.1–15.9)
MCH: 29.6 pg (ref 26.6–33.0)
MCHC: 32.3 g/dL (ref 31.5–35.7)
MCV: 92 fL (ref 79–97)
Platelets: 278 10*3/uL (ref 150–450)
RBC: 3.18 x10E6/uL — ABNORMAL LOW (ref 3.77–5.28)
RDW: 13.6 % (ref 11.7–15.4)
WBC: 6 10*3/uL (ref 3.4–10.8)

## 2021-02-20 NOTE — Telephone Encounter (Signed)
The patient has been notified of the result and verbalized understanding.  All questions (if any) were answered. Antonieta Iba, RN 02/20/2021 1:11 PM

## 2021-02-20 NOTE — Telephone Encounter (Signed)
-----   Message from Richardo Priest, MD sent at 02/20/2021 11:46 AM EST ----- Her hemoglobin is lower than it was in the hospital, lets recheck CBC in 1 week  he is going to contact her regarding Watchman device in [place] I discussed with Dr. Quentin Ore EP

## 2021-02-27 ENCOUNTER — Telehealth: Payer: Self-pay

## 2021-02-27 DIAGNOSIS — I639 Cerebral infarction, unspecified: Secondary | ICD-10-CM

## 2021-02-27 LAB — CBC
Hematocrit: 31.2 % — ABNORMAL LOW (ref 34.0–46.6)
Hemoglobin: 10.3 g/dL — ABNORMAL LOW (ref 11.1–15.9)
MCH: 30 pg (ref 26.6–33.0)
MCHC: 33 g/dL (ref 31.5–35.7)
MCV: 91 fL (ref 79–97)
Platelets: 392 10*3/uL (ref 150–450)
RBC: 3.43 x10E6/uL — ABNORMAL LOW (ref 3.77–5.28)
RDW: 13.7 % (ref 11.7–15.4)
WBC: 8.4 10*3/uL (ref 3.4–10.8)

## 2021-02-27 NOTE — Telephone Encounter (Signed)
-----   Message from Richardo Priest, MD sent at 02/27/2021 11:19 AM EST ----- Hemoglobin is better lets plan on doing a CBC every week for the next 4 weeks as we need to keep her on her anticoagulant waiting for Watchman device

## 2021-02-27 NOTE — Telephone Encounter (Signed)
Spoke with patient regarding results and recommendation.  Patient verbalizes understanding and is agreeable to plan of care. Advised patient to call back with any issues or concerns.  

## 2021-03-03 ENCOUNTER — Telehealth: Payer: Self-pay

## 2021-03-04 ENCOUNTER — Other Ambulatory Visit: Payer: Self-pay

## 2021-03-04 DIAGNOSIS — I639 Cerebral infarction, unspecified: Secondary | ICD-10-CM

## 2021-03-05 ENCOUNTER — Telehealth: Payer: Self-pay

## 2021-03-05 LAB — CBC
Hematocrit: 30.4 % — ABNORMAL LOW (ref 34.0–46.6)
Hemoglobin: 10 g/dL — ABNORMAL LOW (ref 11.1–15.9)
MCH: 30.3 pg (ref 26.6–33.0)
MCHC: 32.9 g/dL (ref 31.5–35.7)
MCV: 92 fL (ref 79–97)
Platelets: 340 10*3/uL (ref 150–450)
RBC: 3.3 x10E6/uL — ABNORMAL LOW (ref 3.77–5.28)
RDW: 13.8 % (ref 11.7–15.4)
WBC: 10.1 10*3/uL (ref 3.4–10.8)

## 2021-03-05 NOTE — Telephone Encounter (Signed)
Patient notified of results. Lab order on file

## 2021-03-05 NOTE — Telephone Encounter (Signed)
-----   Message from Richardo Priest, MD sent at 03/05/2021 10:34 AM EST ----- Regarding: FW: Stable continue to check weekly ----- Message ----- From: Lavone Neri Lab Results In Sent: 03/05/2021   5:38 AM EST To: Richardo Priest, MD

## 2021-03-12 ENCOUNTER — Other Ambulatory Visit: Payer: Self-pay

## 2021-03-12 DIAGNOSIS — I639 Cerebral infarction, unspecified: Secondary | ICD-10-CM

## 2021-03-13 ENCOUNTER — Telehealth: Payer: Self-pay

## 2021-03-13 LAB — CBC
Hematocrit: 32.9 % — ABNORMAL LOW (ref 34.0–46.6)
Hemoglobin: 10.4 g/dL — ABNORMAL LOW (ref 11.1–15.9)
MCH: 29.5 pg (ref 26.6–33.0)
MCHC: 31.6 g/dL (ref 31.5–35.7)
MCV: 94 fL (ref 79–97)
Platelets: 310 10*3/uL (ref 150–450)
RBC: 3.52 x10E6/uL — ABNORMAL LOW (ref 3.77–5.28)
RDW: 14 % (ref 11.7–15.4)
WBC: 7.7 10*3/uL (ref 3.4–10.8)

## 2021-03-13 NOTE — Telephone Encounter (Signed)
-----   Message from Richardo Priest, MD sent at 03/13/2021 10:20 AM EST ----- ?Globin continues to improve lets drop her CBC back to every 2 weeks until she goes ahead and has a Watchman device she has had GI bleeding in the past. ?

## 2021-03-13 NOTE — Telephone Encounter (Signed)
Patient notified and has an appt with Dr. Quentin Ore on Tuesday.  ?

## 2021-03-18 ENCOUNTER — Encounter: Payer: Self-pay | Admitting: *Deleted

## 2021-03-18 ENCOUNTER — Ambulatory Visit (INDEPENDENT_AMBULATORY_CARE_PROVIDER_SITE_OTHER): Payer: PPO | Admitting: Cardiology

## 2021-03-18 ENCOUNTER — Other Ambulatory Visit: Payer: Self-pay

## 2021-03-18 ENCOUNTER — Encounter: Payer: Self-pay | Admitting: Cardiology

## 2021-03-18 VITALS — BP 128/72 | HR 80 | Ht 66.0 in | Wt 161.6 lb

## 2021-03-18 DIAGNOSIS — I480 Paroxysmal atrial fibrillation: Secondary | ICD-10-CM

## 2021-03-18 DIAGNOSIS — I639 Cerebral infarction, unspecified: Secondary | ICD-10-CM | POA: Diagnosis not present

## 2021-03-18 DIAGNOSIS — Z01818 Encounter for other preprocedural examination: Secondary | ICD-10-CM

## 2021-03-18 DIAGNOSIS — I4891 Unspecified atrial fibrillation: Secondary | ICD-10-CM

## 2021-03-18 DIAGNOSIS — I5042 Chronic combined systolic (congestive) and diastolic (congestive) heart failure: Secondary | ICD-10-CM | POA: Diagnosis not present

## 2021-03-18 NOTE — Patient Instructions (Addendum)
Medication Instructions:  ?Your physician recommends that you continue on your current medications as directed. Please refer to the Current Medication list given to you today. ?*If you need a refill on your cardiac medications before your next appointment, please call your pharmacy* ? ?Lab Work: ?BMP ?If you have labs (blood work) drawn today and your tests are completely normal, you will receive your results only by: ?MyChart Message (if you have MyChart) OR ?A paper copy in the mail ?If you have any lab test that is abnormal or we need to change your treatment, we will call you to review the results. ? ?Testing/Procedures: ?Your physician has requested that you have cardiac CT. Cardiac computed tomography (CT) is a painless test that uses an x-ray machine to take clear, detailed pictures of your heart. For further information please visit [email or web address]. Please follow instruction sheet as given. ?  ? ?Follow-Up: ?At [facility], you and your health needs are our priority.  As part of our continuing mission to provide you with exceptional heart care, we have created designated Provider Care Teams.  These Care Teams include your primary Cardiologist (physician) and Advanced Practice Providers (APPs -  Physician Assistants and Nurse Practitioners) who all work together to provide you with the care you need, when you need it. ? ?Your physician wants you to follow-up in:  ? ?Lenice Llamas, the Watchman Nurse Navigator, will call you after your CT once the [facility] Team has reviewed your imaging for an update on proceedings. [place]'s direct number is [phone] if you need assistance.  ? ?We recommend signing up for the patient portal called "MyChart".  Sign up information is provided on this After Visit Summary.  MyChart is used to connect with patients for Virtual Visits (Telemedicine).  Patients are able to view lab/test results, encounter notes, upcoming appointments, etc.  Non-urgent messages can be sent to  your provider as well.   ?To learn more about what you can do with MyChart, go to [email or web address].   ? ?Any Other Special Instructions Will Be Listed Below (If Applicable). ? ?Left Atrial Appendage Closure Device Implantation ?Left atrial appendage (LAA) closure device implantation is a procedure to put a small device in the LAA of the heart. The LAA is a small sac in the wall of the heart's left upper chamber. Blood clots can form in the LAA in people with atrial fibrillation (AFib). The device closes the LAA to help prevent a blood clot and stroke. ?AFib is a type of irregular or rapid heartbeat (arrhythmia). There is an increased risk of blood clots and stroke with AFib. This procedure helps to reduce that risk. ?Tell a health care provider about: ?Any allergies you have. ?All medicines you are taking, including vitamins, herbs, eye drops, creams, and over-the-counter medicines. ?Any problems you or family members have had with anesthetic medicines. ?Any blood disorders you have. ?Any surgeries you have had. ?Any medical conditions you have. ?Whether you are pregnant or may be pregnant. ?What are the risks? ?Generally, this is a safe procedure. However, problems may occur, including: ?Infection. ?Bleeding. ?Allergic reactions to medicines or dyes. ?Damage to nearby structures or organs. ?Heart attack. ?Stroke. ?Blood clots. ?Changes in heart rhythm. ?Device failure. ?What happens before the procedure? ?Staying hydrated ?Follow instructions from your health care provider about hydration, which may include: ?Up to 2 hours before the procedure - you may continue to drink clear liquids, such as water, clear fruit juice, black coffee, and plain tea. ?Eating and drinking restrictions ?Follow  instructions from your health care provider about eating and drinking, which may include: ?8 hours before the procedure - stop eating heavy meals or foods, such as meat, fried foods, or fatty foods. ?6 hours before the  procedure - stop eating light meals or foods, such as toast or cereal. ?6 hours before the procedure - stop drinking milk or drinks that contain milk. ?2 hours before the procedure - stop drinking clear liquids. ?Medicines ?Ask your health care provider about: ?Changing or stopping your regular medicines. This is especially important if you are taking diabetes medicines or blood thinners. ?Taking medicines such as aspirin and ibuprofen. These medicines can thin your blood. Do not take these medicines unless your health care provider tells you to take them. ?Taking over-the-counter medicines, vitamins, herbs, and supplements. ?Tests ?You may have blood tests and a physical exam. ?You may have an electrocardiogram (ECG). This test checks your heart's electrical patterns and rhythms. ?General instructions ?Do not use any products that contain nicotine or tobacco. These include cigarettes, chewing tobacco, and vaping devices, such as e-cigarettes. If you need help quitting, ask your health care provider. ?Ask your health care provider what steps will be taken to help prevent infection. These steps may include: ?Removing hair at the surgery site. ?Washing skin with a germ-killing soap. ?Taking antibiotic medicine. ?Plan to have a responsible adult take you home from the hospital or clinic. ?Plan to have a responsible adult care for you for the time you are told after you leave the hospital or clinic. This is important. ?What happens during the procedure? ?An IV will be inserted into one of your veins. ?You will be given one or more of the following: ?A medicine to help you relax (sedative). ?A medicine to make you fall asleep (general anesthetic). ?A small incision will be made in your groin area. ?A small wire will be put through the incision and into a blood vessel. ?Dye may be injected so X-rays can be used to guide the wire through the blood vessel. ?A long, thin tube (catheter) will be put over the small wire and  moved up through the blood vessel to reach your heart. ?The closure device will be moved through the catheter until it reaches your heart. ?A small hole will be made in the septum (transseptal puncture). The septum is a thin tissue that separates the upper two chambers of the heart. ?The device will be placed so that it closes the LAA. X-rays will be done to make sure the device is in the right place. ?The catheter and wire will be removed. The closure device will remain in your heart. ?After pressure is applied over the catheter site to prevent bleeding, a bandage (dressing) will be placed over the site where the catheter was inserted. ?The procedure may vary among health care providers and hospitals. ?What happens after the procedure? ?Your blood pressure, heart rate, breathing rate, and blood oxygen level will be monitored until you leave the hospital or clinic. ?You may have to wear compression stockings. These stockings help to prevent blood clots and reduce swelling in your legs. ?If you were given a sedative during the procedure, it can affect you for several hours. Do not drive or operate machinery until your health care provider says it is safe. ?You may be given pain medicine. ?You may need to drink more fluids to wash (flush) the dye out of your body. Drink enough fluid to keep your urine pale yellow. ?Take over-the-counter and prescription medicines  only as told by your health care provider. This is especially important if you were given blood thinners. ?Summary ?Left atrial appendage (LAA) closure device implantation is a procedure that is done to put a small device in the LAA of the heart. The LAA is a small sac in the wall of the heart's left upper chamber. ?The device closes the LAA to prevent stroke and other problems. ?Follow instructions from your health care provider before and after the procedure. ?This information is not intended to replace advice given to you by your health care provider. Make  sure you discuss any questions you have with your health care provider. ?Document Revised: 09/07/2019 Document Reviewed: 09/07/2019 ?Elsevier Patient Education ? 2022 [place]. ? ? ? ?  ? ? ?

## 2021-03-18 NOTE — Progress Notes (Signed)
Electrophysiology Office Note:    Date:  03/18/2021   ID:  Marilyn Robinson, DOB 05-Jul-1933, MRN [id number]  PCP:  Myrlene Broker, MD  [place] Cardiologist:  None  [facility] Electrophysiologist:  Vickie Epley, MD   Referring MD: Myrlene Broker, MD   Chief Complaint: Consult for Watchman  History of Present Illness:    Marilyn Robinson is a 88 y.o. female who presents for an evaluation for possible Watchman procedure at the request of Dr. Bettina Gavia. Their medical history includes chronic combined systolic and diastolic heart failure, CAD, bilateral carotid artery stenosis, hypertension, hyperlipidemia, hypokalemia, hyponatremia, stroke, diabetes, GERD, anemia, breast cancer, and obesity.  Marilyn Robinson was seen 02/19/2021 by Dr. Bettina Gavia. At that visit she had recovered from her stroke and was in sinus rhythm. Her recent hospitalization at [facility] was reviewed. She was found to have atrial fibrillation and was receiving Plavix every other day, then advised to be on half dose Eliquis. She also has a history of recurrent GI bleeding. It was recommended she consider Watchman despite her age as she is a poor candidate for long-term anticoagulation.   She is accompanied by her daughter. Overall, she states her stroke recovery is going well. She is still receiving physical therapy at home. She ambulates independently with a walker, and was recently able to drive. Typically she feels energetic, but she suffered from a recent UTI which has slowed her down.   Also, she notices occasional LLE swelling that she attributes to a side effect of her medication.  She confirms prior bleeding issues while on Plavix. Currently she remains compliant with Eliquis, no bleeding issues.  She denies any palpitations, chest pain, or shortness of breath. No lightheadedness, headaches, syncope, orthopnea, or PND.  On 03/28/21 she is scheduled to follow up at [facility] for repeat testing regarding  her stroke.    Past Medical History:  Diagnosis Date   Anemia 07/02/2016   Bilateral carotid artery stenosis 09/22/2015   BMI 31.0-31.9,adult 05/06/2015   Breast cancer (HCC)    Chronic combined systolic and diastolic heart failure ([place]) 07/17/2014   Overview:  EF 56% 09/23/10   Diabetes mellitus without complication (HCC)    Dislocation of right knee with lateral meniscus tear 03/15/2014   Ductal carcinoma in situ (DCIS) of left breast 05/06/2015   GERD (gastroesophageal reflux disease)    Hyperlipidemia 07/17/2014   Hypertension    Hypertensive heart disease with heart failure ([place]) 07/17/2014   Hypertensive urgency 09/22/2015   Hypokalemia 09/22/2015   Hyponatremia 05/29/2015   Mild CAD 07/17/2014   Overview:  50% RCA stenosis in 2005   Non morbid obesity 05/06/2015   Pyuria 09/22/2015   Right knee pain 05/17/2014   S/P arthroscopy of knee 05/17/2014   Stroke-like episode 05/29/2015   Stroke-like symptoms 05/29/2015   TIA (transient ischemic attack)    Uterine prolapse 07/02/2016    Past Surgical History:  Procedure Laterality Date   BREAST BIOPSY     CATARACT EXTRACTION     CHOLECYSTECTOMY     CORONARY ANGIOPLASTY WITH STENT PLACEMENT     EYE SURGERY     TUBAL LIGATION      Current Medications: Current Meds  Medication Sig   ACCU-CHEK GUIDE test strip    acetaminophen (TYLENOL) 500 MG tablet Take 500 mg by mouth every 6 (six) hours as needed for mild pain.   carvedilol (COREG) 25 MG tablet Take 25 mg by mouth 2 (two) times daily.  ciprofloxacin (CIPRO) 500 MG tablet Take 500 mg by mouth 2 (two) times daily.   cloNIDine (CATAPRES) 0.1 MG tablet Take 1 tablet (0.1 mg total) by mouth daily as needed (Take if your blood pressure is >190 on top).   cyanocobalamin (,VITAMIN B-12,) 1000 MCG/ML injection Inject 1 mL into the muscle every 30 (thirty) days.   ELIQUIS 2.5 MG TABS tablet Take 2.5 mg by mouth 2 (two) times daily.   furosemide (LASIX) 20 MG tablet Take 20 mg by mouth daily as  needed for edema.   hydrALAZINE (APRESOLINE) 25 MG tablet Take 50 mg by mouth 3 (three) times daily.   Meclizine HCl 25 MG CHEW Chew 1 tablet by mouth 3 (three) times daily as needed (dizziness).   metFORMIN (GLUCOPHAGE-XR) 500 MG 24 hr tablet Take 500 mg by mouth daily with breakfast. 500 mg in the morning and 1,000 at night   mirtazapine (REMERON) 15 MG tablet Take 15 mg by mouth daily.    olmesartan (BENICAR) 40 MG tablet Take 1 tablet by mouth daily.   pantoprazole (PROTONIX) 40 MG tablet Take 40 mg by mouth daily.   rosuvastatin (CRESTOR) 10 MG tablet Take 10 mg by mouth every other day.   [DISCONTINUED] ciprofloxacin (CIPRO) 500 MG tablet Take by mouth.     Allergies:   Antihistamines, diphenhydramine-type; Lipitor [atorvastatin]; Penicillins; Allopurinol; Aspirin; and Fenofibrate   Social History   Socioeconomic History   Marital status: Married    Spouse name: Not on file   Number of children: Not on file   Years of education: Not on file   Highest education level: Not on file  Occupational History   Not on file  Tobacco Use   Smoking status: Former   Smokeless tobacco: Never  Vaping Use   Vaping Use: Never used  Substance and Sexual Activity   Alcohol use: No   Drug use: No   Sexual activity: Not on file  Other Topics Concern   Not on file  Social History Narrative   Not on file   Social Determinants of Health   Financial Resource Strain: Not on file  Food Insecurity: Not on file  Transportation Needs: Not on file  Physical Activity: Not on file  Stress: Not on file  Social Connections: Not on file     Family History: The patient's family history includes CAD in her father and mother; Diabetes in her father and mother; Stroke in her mother.  ROS:   Please see the history of present illness.    All other systems reviewed and are negative.  EKGs/Labs/Other Studies Reviewed:    The following studies were reviewed today:  Echo  01/21/2021: Conclusions: The left ventricular size is normal. Left ventricular wall thickness is normal. There is normal global left ventricular contractility. The left atrium is mildly dilated. No evidence of interatrial communication by color flow doppler analysis and bubble study. Interatrial and interventricular septum intact.  Echo TTE 10/19/2020 ([place]): The left ventricular size is normal. There is mild concentric left  ventricular hypertrophy. The left ventricular wall motion is normal.  The left ventricle is hyperdynamic. LV ejection fraction = >70%.  Mild diastolic dysfunction [Grade IB] with likely increased left  atrial pressure. E/e' '10-19'. E/e' < 8 is normal, > 15 is suggestive  of increased PCWP.  The right ventricle is normal in size and function.  The atria are mildly dilated.  There is no significant valvular stenosis or regurgitation.  There was insufficient  TR detected to calculate RV systolic pressure.  IVC size was normal.   There is no significant change in comparison with the last study.   Monitor 10/2019: ZIO monitor was performed for 7 days 5 hours beginning 09/15/2019 to assess arrhythmia associated with TIA.   Cardiac rhythm throughout was sinus with average, minimum and maximum heart rates of 66, 51 and 98 bpm.   There were no pauses of 3 seconds or greater and no episodes of sinus node exit block or second or third-degree AV node block.   There were no triggered or diary events.   Ventricular ectopy was rare with isolated PVCs and couplets.   Supraventricular ectopy was rare and there were no episodes of atrial fibrillation or flutter. There were 20 brief runs of atrial premature contractions the longest 17 complexes at a slow rate of 112 bpm, brief atrial tachycardia.   Conclusion, rare ventricular and supraventricular ectopy and no episodes of atrial fibrillation or flutter.  EKG:   EKG is personally reviewed.  03/18/2021:  Sinus rhythm.  PVC.   Recent Labs: 02/19/2021: BUN 18; Creatinine, Ser 1.29; Potassium 4.5; Sodium 136 03/12/2021: Hemoglobin 10.4; Platelets 310   Recent Lipid Panel    Component Value Date/Time   CHOL 119 08/19/2016 1042   TRIG 107 08/19/2016 1042   HDL 41 08/19/2016 1042   CHOLHDL 3.3 05/29/2015 1549   VLDL 22 05/29/2015 1549   LDLCALC 57 08/19/2016 1042    Physical Exam:    VS:  BP 128/72    Pulse 80    Ht '5\' 6"'$  (1.676 m)    Wt 161 lb 9.6 oz (73.3 kg)    SpO2 98%    BMI 26.08 kg/m     Wt Readings from Last 3 Encounters:  03/18/21 161 lb 9.6 oz (73.3 kg)  02/19/21 164 lb (74.4 kg)  12/12/20 171 lb (77.6 kg)     GEN: Well nourished, well developed in no acute distress HEENT: Normal NECK: No JVD; No carotid bruits LYMPHATICS: No lymphadenopathy CARDIAC: RRR, no murmurs, rubs, gallops RESPIRATORY:  Clear to auscultation without rales, wheezing or rhonchi  ABDOMEN: Soft, non-tender, non-distended MUSCULOSKELETAL:  No edema; No deformity  SKIN: Warm and dry NEUROLOGIC:  Alert and oriented x 3 PSYCHIATRIC:  Normal affect       ASSESSMENT:    1. Cerebrovascular accident (CVA), unspecified mechanism ([place])   2. Paroxysmal atrial fibrillation ([place])   3. Pre-op evaluation   4. Chronic combined systolic and diastolic heart failure (HCC)    PLAN:    In order of problems listed above:  #Paroxysmal atrial fibrillation Currently on Eliquis for stroke prophylaxis, 2.5 mg by mouth twice daily.  She is thought to be a poor candidate for long-term anticoagulation given her history of recurrent GI bleeding.  I do think she is a good candidate for a left atrial appendage occlusion procedure even with her advanced age.  She is quite active and is having a brisk recovery after her stroke.    I discussed left atrial appendage occlusion with the patient and her daughter during today's visit in detail.  We discussed the procedure, the risks and need for short-term anticoagulation.  She  has an upcoming evaluation by [facility] vascular.  I will plan to review the results of their evaluation before making a final decision about left atrial appendage occlusion candidacy.  I will go ahead and order a cardiac CT scan to assess her left atrial appendage anatomy.   I  will plan to touch base with our watchman coordinator after the March 17 appointment with [facility] vascular to decide upon next steps.  -------------------------------------  I have seen Marilyn Robinson in the office today who is being considered for a Watchman left atrial appendage closure device. I believe they will benefit from this procedure given their history of atrial fibrillation, CHA2DS2-VASc score of 9 and unadjusted ischemic stroke rate of 12.2% per year. Unfortunately, the patient is not felt to be a long term anticoagulation candidate secondary to history of recurrent GI bleeding. The patient's chart has been reviewed and I feel that they would be a candidate for short term oral anticoagulation after Watchman implant.   It is my belief that after undergoing a LAA closure procedure, Marilyn Robinson will not need long term anticoagulation which eliminates anticoagulation side effects and major bleeding risk.   Procedural risks for the Watchman implant have been reviewed with the patient including a 0.5% risk of stroke, <1% risk of perforation and <1% risk of device embolization.    The published clinical data on the safety and effectiveness of WATCHMAN include but are not limited to the following: - Holmes DR, Mechele Claude, Sick P et al. for the PROTECT AF Investigators. Percutaneous closure of the left atrial appendage versus warfarin therapy for prevention of stroke in patients with atrial fibrillation: a randomised non-inferiority trial. Lancet 2009; 374: 534-42. Mechele Claude, Doshi SK, Abelardo Diesel D et al. on behalf of the PROTECT AF Investigators. Percutaneous Left Atrial Appendage Closure for  Stroke Prophylaxis in Patients With Atrial Fibrillation 2.3-Year Follow-up of the PROTECT AF (Watchman Left Atrial Appendage System for Embolic Protection in Patients With Atrial Fibrillation) Trial. Circulation 2013; 127:720-729. - Alli O, Doshi S,  Kar S, Reddy VY, Sievert H et al. Quality of Life Assessment in the Randomized PROTECT AF (Percutaneous Closure of the Left Atrial Appendage Versus Warfarin Therapy for Prevention of Stroke in Patients With Atrial Fibrillation) Trial of Patients at Risk for Stroke With Nonvalvular Atrial Fibrillation. J Am Coll Cardiol 2013; 62:7035-0. Vertell Limber DR, Tarri Abernethy, Price M, Coyle, Sievert H, Doshi S, Huber K, Reddy V. Prospective randomized evaluation of the Watchman left atrial appendage Device in patients with atrial fibrillation versus long-term warfarin therapy; the PREVAIL trial. Journal of the [employer] of Cardiology, Vol. 4, No. 1, 2014, 1-11. - Kar S, Doshi SK, Sadhu A, Horton R, Osorio J et al. Primary outcome evaluation of a next-generation left atrial appendage closure device: results from the PINNACLE FLX trial. Circulation 2021;143(18)1754-1762.    After today's visit with the patient which was dedicated solely for shared decision making visit regarding LAA closure device, the patient decided to proceed with the watchman scheduling process pending the [facility] vascular evaluation. Prior to the procedure, I would like to obtain a gated CT scan of the chest with contrast timed for PV/LA visualization.    HAS-BLED score 4 Hypertension Yes  Abnormal renal and liver function (Dialysis, transplant, Cr >2.26 mg/dL /Cirrhosis or Bilirubin >2x Normal or AST/ALT/AP >3x Normal) No  Stroke Yes  Bleeding Yes  Labile INR (Unstable/high INR) No  Elderly (>65) Yes  Drugs or alcohol (? 8 drinks/week, anti-plt or NSAID) No   CHA2DS2-VASc Score = 9  The patient's score is based upon: CHF History: 1 HTN History: 1 Diabetes History: 1 Stroke  History: 2 Vascular Disease History: 1 Age Score: 2 Gender Score: 1    Total time spent with patient  today 60 minutes. This includes reviewing records, evaluating the patient and coordinating care.  Medication Adjustments/Labs and Tests Ordered: Current medicines are reviewed at length with the patient today.  Concerns regarding medicines are outlined above.  Orders Placed This Encounter  Procedures   Basic Metabolic Panel (BMET)   EKG 12-Lead   No orders of the defined types were placed in this encounter.   I,Mathew Stumpf,acting as a Education administrator for Vickie Epley, MD.,have documented all relevant documentation on the behalf of Vickie Epley, MD,as directed by  Vickie Epley, MD while in the presence of Vickie Epley, MD.  I, Vickie Epley, MD, have reviewed all documentation for this visit. The documentation on 03/18/21 for the exam, diagnosis, procedures, and orders are all accurate and complete.   Signed, Hilton Cork. Quentin Ore, MD, [facility], [facility] 03/18/2021 9:41 PM    Electrophysiology [facility]

## 2021-03-19 LAB — BASIC METABOLIC PANEL
BUN/Creatinine Ratio: 18 (ref 12–28)
BUN: 22 mg/dL (ref 8–27)
CO2: 18 mmol/L — ABNORMAL LOW (ref 20–29)
Calcium: 9.8 mg/dL (ref 8.7–10.3)
Chloride: 98 mmol/L (ref 96–106)
Creatinine, Ser: 1.2 mg/dL — ABNORMAL HIGH (ref 0.57–1.00)
Glucose: 117 mg/dL — ABNORMAL HIGH (ref 70–99)
Potassium: 4.8 mmol/L (ref 3.5–5.2)
Sodium: 131 mmol/L — ABNORMAL LOW (ref 134–144)
eGFR: 44 mL/min/{1.73_m2} — ABNORMAL LOW (ref >59)

## 2021-03-20 NOTE — Addendum Note (Signed)
Addended by: Darrell Jewel on: 03/20/2021 10:34 AM ? ? Modules accepted: Orders ? ?

## 2021-03-26 ENCOUNTER — Other Ambulatory Visit: Payer: Self-pay

## 2021-03-26 DIAGNOSIS — I639 Cerebral infarction, unspecified: Secondary | ICD-10-CM

## 2021-03-27 LAB — CBC
Hematocrit: 32.9 % — ABNORMAL LOW (ref 34.0–46.6)
Hemoglobin: 10.7 g/dL — ABNORMAL LOW (ref 11.1–15.9)
MCH: 30.1 pg (ref 26.6–33.0)
MCHC: 32.5 g/dL (ref 31.5–35.7)
MCV: 92 fL (ref 79–97)
Platelets: 358 10*3/uL (ref 150–450)
RBC: 3.56 x10E6/uL — ABNORMAL LOW (ref 3.77–5.28)
RDW: 13.8 % (ref 11.7–15.4)
WBC: 8.1 10*3/uL (ref 3.4–10.8)

## 2021-04-03 ENCOUNTER — Telehealth: Payer: Self-pay

## 2021-04-03 ENCOUNTER — Telehealth (HOSPITAL_COMMUNITY): Payer: Self-pay | Admitting: Emergency Medicine

## 2021-04-03 NOTE — Telephone Encounter (Signed)
Reaching out to patient to offer assistance regarding upcoming cardiac imaging study; pt verbalizes understanding of appt date/time, parking situation and where to check in, pre-test NPO status and medications ordered, and verified current allergies; name and call back number provided for further questions should they arise ?Marchia Bond RN Navigator Cardiac Imaging ?[facility] ?[phone] office ?[phone] cell ? ?Denies iv isuses ?Daily meds. Holding diuretic ?Arrival 130 ?

## 2021-04-03 NOTE — Telephone Encounter (Signed)
-----   Message from Vickie Epley, MD sent at 04/03/2021 10:14 AM EDT ----- ?Regarding: RE: [facility] note ?OK to proceed with watchman evaluation. ?[place] for surveillance of the carotids. ? ?Thanks! ?Lysbeth Galas ? ? ? ? ? ?----- Message ----- ?From: Theodoro Parma, RN ?Sent: 04/02/2021   4:15 PM EDT ?To: Vickie Epley, MD ?Subject: [facility] note                     ? ?Hey! I swear I thought I sent you a message on her so I'm sorry if this is a duplicate. ? ?You saw Ms. Raimondi for [facility] consult 3/7 and wanted to read her note with [facility] prior to proceeding. She saw them yesterday and the note is signed and available for viewing in Campus but it won't let me route it to you.  ? ?Let me know how to proceed after reviewing. ? ?Thank you! ?KK ? ? ?

## 2021-04-03 NOTE — Telephone Encounter (Signed)
Per Dr. Quentin Ore, informed the patient he reviewed [facility] notes and it is OK to proceed with Watchman evaluation.  ?She will keep her CT appointment as scheduled tomorrow and looks forward to next steps. ?She was grateful for call and agrees with plan.  ?

## 2021-04-04 ENCOUNTER — Ambulatory Visit (HOSPITAL_COMMUNITY)
Admission: RE | Admit: 2021-04-04 | Discharge: 2021-04-04 | Disposition: A | Discharge status: Home / Self Care | Payer: PPO | Source: Ambulatory Visit | Attending: Cardiology | Admitting: Cardiology

## 2021-04-04 ENCOUNTER — Other Ambulatory Visit: Payer: Self-pay

## 2021-04-04 DIAGNOSIS — I4891 Unspecified atrial fibrillation: Secondary | ICD-10-CM

## 2021-04-04 DIAGNOSIS — Z01818 Encounter for other preprocedural examination: Secondary | ICD-10-CM | POA: Diagnosis present

## 2021-04-04 MED ORDER — IOHEXOL 350 MG/ML SOLN
80.0000 mL | Freq: Once | INTRAVENOUS | Status: AC | PRN
  Administered 2021-04-04: 80 mL via INTRAVENOUS

## 2021-04-04 MED ORDER — NITROGLYCERIN 0.4 MG SL SUBL
0.8000 mg | SUBLINGUAL_TABLET | Freq: Once | SUBLINGUAL | Status: DC
Start: 2021-04-04 — End: 2021-04-04

## 2021-04-08 ENCOUNTER — Telehealth: Payer: Self-pay

## 2021-04-08 DIAGNOSIS — I719 Aortic aneurysm of unspecified site, without rupture: Secondary | ICD-10-CM

## 2021-04-08 NOTE — Telephone Encounter (Signed)
Discussed pre-Watchman CT with Dr. Gasper Sells, specifically: ?1. Focal contrast outpouching of the distal descending thoracic ?aorta measuring up to 1.2 cm, compatible with a penetrating ?atherosclerotic ulcer. ? ?If symptomatic, the patient will need to go to ER. If not, CT dissection protocol will need to be done tomorrow. ? ?Spoke with patient. She states she has no symptoms and specifically denies CP, ABD pain, back pain, SOB.  ?She understands she will be called in the AM for an appointment for CT. She understands to call [phone] if any of above symptoms occur. ?She was grateful for call and agrees with plan.  ?

## 2021-04-09 ENCOUNTER — Inpatient Hospital Stay: Admission: RE | Admit: 2021-04-09 | Payer: PPO | Source: Ambulatory Visit

## 2021-04-09 NOTE — Telephone Encounter (Signed)
Offered the patient an appointment today at 1000 for CT, but she declined as she cannot get a ride. ?Scheduled her for CT tomorrow at 4:00PM at [facility] office.  ?She will follow same instructions as Watchman CT. ?She was grateful for call and agrees with plan. ?

## 2021-04-09 NOTE — Addendum Note (Signed)
Addended by: Harland German A on: 04/09/2021 09:38 AM ? ? Modules accepted: Orders ? ?

## 2021-04-10 ENCOUNTER — Other Ambulatory Visit: Payer: Self-pay | Admitting: Physician Assistant

## 2021-04-10 ENCOUNTER — Ambulatory Visit (INDEPENDENT_AMBULATORY_CARE_PROVIDER_SITE_OTHER)
Admission: RE | Admit: 2021-04-10 | Discharge: 2021-04-10 | Disposition: A | Discharge status: Home / Self Care | Payer: PPO | Source: Ambulatory Visit | Attending: Physician Assistant | Admitting: Physician Assistant

## 2021-04-10 DIAGNOSIS — I719 Aortic aneurysm of unspecified site, without rupture: Secondary | ICD-10-CM | POA: Diagnosis not present

## 2021-04-10 MED ORDER — IOHEXOL 350 MG/ML SOLN
80.0000 mL | Freq: Once | INTRAVENOUS | Status: AC | PRN
Start: 2021-04-10 — End: 2021-04-10
  Administered 2021-04-10: 80 mL via INTRAVENOUS

## 2021-04-10 NOTE — Progress Notes (Signed)
am

## 2021-04-15 ENCOUNTER — Other Ambulatory Visit: Payer: Self-pay

## 2021-04-15 DIAGNOSIS — I639 Cerebral infarction, unspecified: Secondary | ICD-10-CM

## 2021-04-16 LAB — CBC
Hematocrit: 33.8 % — ABNORMAL LOW (ref 34.0–46.6)
Hemoglobin: 11.1 g/dL (ref 11.1–15.9)
MCH: 29.6 pg (ref 26.6–33.0)
MCHC: 32.8 g/dL (ref 31.5–35.7)
MCV: 90 fL (ref 79–97)
Platelets: 294 10*3/uL (ref 150–450)
RBC: 3.75 x10E6/uL — ABNORMAL LOW (ref 3.77–5.28)
RDW: 13.6 % (ref 11.7–15.4)
WBC: 7.3 10*3/uL (ref 3.4–10.8)

## 2021-04-17 ENCOUNTER — Other Ambulatory Visit: Payer: Self-pay

## 2021-04-17 DIAGNOSIS — I639 Cerebral infarction, unspecified: Secondary | ICD-10-CM

## 2021-04-21 DIAGNOSIS — N6321 Unspecified lump in the left breast, upper outer quadrant: Secondary | ICD-10-CM

## 2021-04-21 HISTORY — DX: Unspecified lump in the left breast, upper outer quadrant: N63.21

## 2021-04-29 ENCOUNTER — Telehealth: Payer: Self-pay | Admitting: Cardiology

## 2021-04-29 NOTE — Telephone Encounter (Signed)
Pt needs sample of eliquis to get her through to get her through to tomorrow ?

## 2021-04-30 ENCOUNTER — Telehealth: Payer: Self-pay

## 2021-04-30 LAB — CBC WITH DIFFERENTIAL/PLATELET
Basophils Absolute: 0 10*3/uL (ref 0.0–0.2)
Basos: 1 %
EOS (ABSOLUTE): 0.3 10*3/uL (ref 0.0–0.4)
Eos: 4 %
Hematocrit: 33.2 % — ABNORMAL LOW (ref 34.0–46.6)
Hemoglobin: 10.8 g/dL — ABNORMAL LOW (ref 11.1–15.9)
Immature Grans (Abs): 0 10*3/uL (ref 0.0–0.1)
Immature Granulocytes: 0 %
Lymphocytes Absolute: 1.2 10*3/uL (ref 0.7–3.1)
Lymphs: 17 %
MCH: 29.3 pg (ref 26.6–33.0)
MCHC: 32.5 g/dL (ref 31.5–35.7)
MCV: 90 fL (ref 79–97)
Monocytes Absolute: 0.6 10*3/uL (ref 0.1–0.9)
Monocytes: 9 %
Neutrophils Absolute: 4.8 10*3/uL (ref 1.4–7.0)
Neutrophils: 69 %
Platelets: 281 10*3/uL (ref 150–450)
RBC: 3.68 x10E6/uL — ABNORMAL LOW (ref 3.77–5.28)
RDW: 13.4 % (ref 11.7–15.4)
WBC: 6.8 10*3/uL (ref 3.4–10.8)

## 2021-04-30 NOTE — Telephone Encounter (Signed)
Ms. Colcord reports she found a lump in her breast and had a mammogram and was told she needs a biopsy. She has a history of breast cancer. ?She was told that she will not need to hold any blood thinners for biopsy.  ?She has her appointment with Dr. Carlis Abbott 4/25 and has her biopsy 4/26. She will keep both appointments and understands she will be called the week of May 1 for an update. She was grateful for assistance.  ?

## 2021-05-06 ENCOUNTER — Encounter: Payer: Self-pay | Admitting: Vascular Surgery

## 2021-05-06 ENCOUNTER — Ambulatory Visit (INDEPENDENT_AMBULATORY_CARE_PROVIDER_SITE_OTHER): Payer: PPO | Admitting: Vascular Surgery

## 2021-05-06 DIAGNOSIS — I700 Atherosclerosis of aorta: Secondary | ICD-10-CM | POA: Insufficient documentation

## 2021-05-06 HISTORY — DX: Atherosclerosis of aorta: I70.0

## 2021-05-06 NOTE — Progress Notes (Signed)
? ? ?Patient name: Marilyn Robinson MRN: [id number] DOB: November 12, 1933 Sex: female ? ?REASON FOR CONSULT: Focal penetrating atherosclerotic ulcer ? ?HPI: ?Marilyn Robinson is a 88 y.o. female, with history of atrial fibrillation, hypertension, hyperlipidemia, diabetes, breast cancer that presents for evaluation of a focal penetrating atherosclerotic ulcer of the descending thoracic aorta.  Patient states she has been undergoing work-up for the Watchman procedure with Dr. Quentin Ore.  As part of that she had a CTA chest abdomen pelvis.  The CT was done on 04/10/2021 showing mixed atherosclerotic plaque throughout the thoracic and abdominal aorta with a focal ulceration of the mid descending thoracic aorta measuring 1.6 x 1.1 cm in breadth and depth.  Patient denies any chest or back pain.  She feels she is in her normal state of health otherwise. ? ?Past Medical History:  ?Diagnosis Date  ? Anemia 07/02/2016  ? Bilateral carotid artery stenosis 09/22/2015  ? BMI 31.0-31.9,adult 05/06/2015  ? Breast cancer ([place])   ? Chronic combined systolic and diastolic heart failure ([place]) 07/17/2014  ? Overview:  EF 56% 09/23/10  ? Diabetes mellitus without complication ([place])   ? Dislocation of right knee with lateral meniscus tear 03/15/2014  ? Ductal carcinoma in situ (DCIS) of left breast 05/06/2015  ? GERD (gastroesophageal reflux disease)   ? Hyperlipidemia 07/17/2014  ? Hypertension   ? Hypertensive heart disease with heart failure ([place]) 07/17/2014  ? Hypertensive urgency 09/22/2015  ? Hypokalemia 09/22/2015  ? Hyponatremia 05/29/2015  ? Mild CAD 07/17/2014  ? Overview:  50% RCA stenosis in 2005  ? Non morbid obesity 05/06/2015  ? Pyuria 09/22/2015  ? Right knee pain 05/17/2014  ? S/P arthroscopy of knee 05/17/2014  ? Stroke-like episode 05/29/2015  ? Stroke-like symptoms 05/29/2015  ? TIA (transient ischemic attack)   ? Uterine prolapse 07/02/2016  ? ? ?Past Surgical History:  ?Procedure Laterality Date  ? BREAST BIOPSY    ? CATARACT EXTRACTION    ?  CHOLECYSTECTOMY    ? CORONARY ANGIOPLASTY WITH STENT PLACEMENT    ? EYE SURGERY    ? TUBAL LIGATION    ? ? ?Family History  ?Problem Relation Age of Onset  ? CAD Mother   ? Diabetes Mother   ? Stroke Mother   ? CAD Father   ? Diabetes Father   ? ? ?SOCIAL HISTORY: ?Social History  ? ?Socioeconomic History  ? Marital status: Married  ?  Spouse name: Not on file  ? Number of children: Not on file  ? Years of education: Not on file  ? Highest education level: Not on file  ?Occupational History  ? Not on file  ?Tobacco Use  ? Smoking status: Former  ? Smokeless tobacco: Never  ?Vaping Use  ? Vaping Use: Never used  ?Substance and Sexual Activity  ? Alcohol use: No  ? Drug use: No  ? Sexual activity: Not on file  ?Other Topics Concern  ? Not on file  ?Social History Narrative  ? Not on file  ? ?Social Determinants of Health  ? ?Financial Resource Strain: Not on file  ?Food Insecurity: Not on file  ?Transportation Needs: Not on file  ?Physical Activity: Not on file  ?Stress: Not on file  ?Social Connections: Not on file  ?Intimate Partner Violence: Not on file  ? ? ?Allergies  ?Allergen Reactions  ? Antihistamines, Diphenhydramine-Type Other (See Comments)  ?  Causes headaches  ? Lipitor [Atorvastatin] Other (See Comments)  ?  Caused muscle weakness, but can  tolerate a low dose of Crestor  ? Penicillins   ?  Has patient had a PCN reaction causing immediate rash, facial/tongue/throat swelling, SOB or lightheadedness with hypotension: Yes ?Has patient had a PCN reaction causing severe rash involving [id number] membranes or skin necrosis: No ?Has patient had a PCN reaction that required hospitalization: No ?Has patient had a PCN reaction occurring within the last 10 years: No ?If all of the above answers are "NO", then may proceed with Cephalosporin use. ?  ? Allopurinol Rash  ? Aspirin Nausea And Vomiting and Other (See Comments)  ?  bleeding  ? Fenofibrate Rash  ?  Pruritus, Rash  ? ? ?Current Outpatient Medications   ?Medication Sig Dispense Refill  ? ACCU-CHEK GUIDE test strip     ? acetaminophen (TYLENOL) 500 MG tablet Take 500 mg by mouth every 6 (six) hours as needed for mild pain.    ? carvedilol (COREG) 25 MG tablet Take 25 mg by mouth 2 (two) times daily.     ? ciprofloxacin (CIPRO) 500 MG tablet Take 500 mg by mouth 2 (two) times daily.    ? cloNIDine (CATAPRES) 0.1 MG tablet Take 1 tablet (0.1 mg total) by mouth daily as needed (Take if your blood pressure is >190 on top). 30 tablet 3  ? cyanocobalamin (,VITAMIN B-12,) 1000 MCG/ML injection Inject 1 mL into the muscle every 30 (thirty) days.    ? ELIQUIS 2.5 MG TABS tablet Take 2.5 mg by mouth 2 (two) times daily.    ? furosemide (LASIX) 20 MG tablet Take 20 mg by mouth daily as needed for edema.    ? hydrALAZINE (APRESOLINE) 25 MG tablet Take 50 mg by mouth 3 (three) times daily.    ? Meclizine HCl 25 MG CHEW Chew 1 tablet by mouth 3 (three) times daily as needed (dizziness).    ? metFORMIN (GLUCOPHAGE-XR) 500 MG 24 hr tablet Take 500 mg by mouth daily with breakfast. 500 mg in the morning and 1,000 at night    ? mirtazapine (REMERON) 15 MG tablet Take 15 mg by mouth daily.     ? olmesartan (BENICAR) 40 MG tablet Take 1 tablet by mouth daily.    ? pantoprazole (PROTONIX) 40 MG tablet Take 40 mg by mouth daily.    ? rosuvastatin (CRESTOR) 10 MG tablet Take 10 mg by mouth every other day.    ? ?No current facility-administered medications for this visit.  ? ? ?REVIEW OF SYSTEMS:  ?'[X]'$  denotes positive finding, '[ ]'$  denotes negative finding ?Cardiac  Comments:  ?Chest pain or chest pressure:    ?Shortness of breath upon exertion:    ?Short of breath when lying flat:    ?Irregular heart rhythm:    ?    ?Vascular    ?Pain in calf, thigh, or hip brought on by ambulation:    ?Pain in feet at night that wakes you up from your sleep:     ?Blood clot in your veins:    ?Leg swelling:     ?    ?Pulmonary    ?Oxygen at home:    ?Productive cough:     ?Wheezing:     ?    ?Neurologic     ?Sudden weakness in arms or legs:     ?Sudden numbness in arms or legs:     ?Sudden onset of difficulty speaking or slurred speech:    ?Temporary loss of vision in one eye:     ?Problems with dizziness:     ?    ?  Gastrointestinal    ?Blood in stool:     ?Vomited blood:     ?    ?Genitourinary    ?Burning when urinating:     ?Blood in urine:    ?    ?Psychiatric    ?Major depression:     ?    ?Hematologic    ?Bleeding problems:    ?Problems with blood clotting too easily:    ?    ?Skin    ?Rashes or ulcers:    ?    ?Constitutional    ?Fever or chills:    ? ? ?PHYSICAL EXAM: ?Vitals:  ? 05/06/21 1355  ?BP: (!) 170/75  ?Pulse: 62  ?Resp: 14  ?Temp: 97.7 ?F (36.5 ?C)  ?TempSrc: Temporal  ?SpO2: 97%  ?Weight: 166 lb (75.3 kg)  ?Height: 5' 6.5" (1.689 m)  ? ? ?GENERAL: The patient is a well-nourished female, in no acute distress. The vital signs are documented above. ?CARDIAC: There is a regular rate and rhythm.  ?VASCULAR:  ?Palpable femoral pulses bilaterally ?No palpable pedal pulses ?PULMONARY: No respiratory distress. ?ABDOMEN: Soft and non-tender. ?MUSCULOSKELETAL: There are no major deformities or cyanosis. ?NEUROLOGIC: No focal weakness or paresthesias are detected. ?SKIN: There are no ulcers or rashes noted. ?PSYCHIATRIC: The patient has a normal affect. ? ?DATA:  ? ?CTA reviewed from 04/10/2021 and shows a focal penetrating aortic ulcer in the descending thoracic aorta measuring about 1.1 cm deep (aortic diameter here about 2.6 cm) ? ?Assessment/Plan: ? ?88 year old female presents for evaluation of a incidentally discovered penetrating aortic ulcer in her descending thoracic aorta discovered on CTA during work-up of Watchman procedure through cardiology.  I reviewed the CT scan with the patient and her daughter here in the office and discussed the etiology for penetrating aortic ulcers.  Discussed this ulceration is relatively small and I do not think it needs to be repaired at this time especially given she  is asymptomatic with no chest pain or back pain.  Discussed I would favor close interval surveillance to ensure there is not any significant interval change and then we can hopefully follow this long-term.  I

## 2021-05-07 ENCOUNTER — Other Ambulatory Visit: Payer: Self-pay

## 2021-05-07 DIAGNOSIS — I700 Atherosclerosis of aorta: Secondary | ICD-10-CM

## 2021-05-12 NOTE — Progress Notes (Signed)
Initial phone contact with newly diagnosed breast cancer patient. Patient has had breast cancer in the past and is already scheduled to see Dr. Noberto Retort, who ordered the biopsy. Pt wishes to be seen by Dr. Bobby Rumpf, as well, who follows her for her previous diagnosis. ?

## 2021-05-14 NOTE — Telephone Encounter (Signed)
Called to touch base with patient. Left message that she does not have to call back unless she'd like to - that she will be followed and called after her follow-up with Dr. Carlis Abbott otherwise. ?

## 2021-05-15 NOTE — Telephone Encounter (Signed)
Spoke with the patient who states her biopsy was positive for cancer. She has an appointment 5/9 with the surgeon to finalize a plan.  ?At this time, encouraged her to keep her upcoming appointments with Oncology and Dr. Carlis Abbott and we will touch base after those visits in several weeks. ?She was grateful for time spent. ?

## 2021-05-20 ENCOUNTER — Telehealth: Payer: Self-pay

## 2021-05-20 NOTE — Telephone Encounter (Signed)
? ?  Name: Marilyn Robinson  ?DOB: May 25, 1933  ?MRN: [id number] ? ?Primary Cardiologist: None ? ?Chart reviewed as part of pre-operative protocol coverage. Because of Marilyn Robinson's past medical history and time since last visit, she will require a follow-up in-office visit in order to better assess preoperative cardiovascular risk. ? ?Pre-op covering staff: ?- Please schedule appointment and call patient to inform them. If patient already had an upcoming appointment within acceptable timeframe, please add "pre-op clearance" to the appointment notes so provider is aware. ?- Please contact requesting surgeon's office via preferred method (i.e, phone, fax) to inform them of need for appointment prior to surgery. ? ?Please arrange a earlier follow-up with Dr. Bettina Gavia.  Currently scheduled to see Dr. Bettina Gavia on 06/17/2021.  ? ?Marilyn Robinson, [place]  ?05/20/2021, 6:37 PM  ? ?

## 2021-05-20 NOTE — Telephone Encounter (Signed)
Clinical pharmacist to review Eliquis 

## 2021-05-20 NOTE — Telephone Encounter (Signed)
? ?  Pre-operative Risk Assessment  ?  ?Patient Name: Marilyn Robinson  ?DOB: 04/01/1933 ?MRN: [id number]  ? ?  ? ?Request for Surgical Clearance   ? ?Procedure:   Needle Localized lumpectomy and or Mastectomy  ? ?Date of Surgery:  Clearance TBD                              ?   ?Surgeon:  Dr. Marylu Lund, MD ?Surgeon's Group or Practice Name:  [facility] [place] ?Phone number:  [phone] ?Fax number:  [phone] ?  ?Type of Clearance Requested:   ?- Medical  ?  ?Type of Anesthesia:  General  ?  ?Additional requests/questions:   ? ?Signed, ?Toni Arthurs   ?05/20/2021, 3:23 PM   ?

## 2021-05-21 NOTE — Telephone Encounter (Addendum)
Patient with diagnosis of afib on Eliquis for anticoagulation.   ? ?Procedure: needle localized lumpectomy and/or mastectomy ?Date of procedure: TBD ? ?CHA2DS2-VASc Score = 9  ?This indicates a 12.2% annual risk of stroke. ?The patient's score is based upon: ?CHF History: 1 ?HTN History: 1 ?Diabetes History: 1 ?Stroke History: 2 ?Vascular Disease History: 1 ?Age Score: 2 ?Gender Score: 1 ?  ?CrCl 4m/min ?Platelet count 281K ? ?Has both high stroke risk and high bleed risk. On lower dose of Eliquis although she qualifies for full dose due to her hx of recurrent GI bleeding, potentially pending Watchman procedure. Had previously been on Plavix every other day, this is no longer on her med list. Most recent stroke in January 2023. ? ?Per office protocol, patient can hold Eliquis for 1 day prior to procedure. If longer hold is required, will need MD clearance given elevated CV risk. She should resume anticoag as soon as safely possible after procedure. ?

## 2021-05-21 NOTE — Telephone Encounter (Signed)
Per the request of the pre op provider Almyra Deforest, [employer] can we see if the pt can be seen sooner for pre op clearance. Procedure: Procedure: needle localized lumpectomy and/or mastectomy ? ?I will send a message to Dr. Bettina Gavia office for sooner appt.  ?

## 2021-05-22 ENCOUNTER — Ambulatory Visit (INDEPENDENT_AMBULATORY_CARE_PROVIDER_SITE_OTHER): Payer: PPO | Admitting: Cardiology

## 2021-05-22 VITALS — BP 138/72 | HR 61 | Ht 66.0 in | Wt 164.0 lb

## 2021-05-22 DIAGNOSIS — Z01810 Encounter for preprocedural cardiovascular examination: Secondary | ICD-10-CM | POA: Diagnosis not present

## 2021-05-22 DIAGNOSIS — I4821 Permanent atrial fibrillation: Secondary | ICD-10-CM | POA: Insufficient documentation

## 2021-05-22 DIAGNOSIS — B3731 Acute candidiasis of vulva and vagina: Secondary | ICD-10-CM | POA: Insufficient documentation

## 2021-05-22 DIAGNOSIS — E118 Type 2 diabetes mellitus with unspecified complications: Secondary | ICD-10-CM

## 2021-05-22 DIAGNOSIS — Z01818 Encounter for other preprocedural examination: Secondary | ICD-10-CM

## 2021-05-22 DIAGNOSIS — R531 Weakness: Secondary | ICD-10-CM | POA: Insufficient documentation

## 2021-05-22 DIAGNOSIS — E860 Dehydration: Secondary | ICD-10-CM

## 2021-05-22 DIAGNOSIS — I10 Essential (primary) hypertension: Secondary | ICD-10-CM

## 2021-05-22 DIAGNOSIS — Z7409 Other reduced mobility: Secondary | ICD-10-CM | POA: Insufficient documentation

## 2021-05-22 DIAGNOSIS — I2510 Atherosclerotic heart disease of native coronary artery without angina pectoris: Secondary | ICD-10-CM

## 2021-05-22 DIAGNOSIS — I6523 Occlusion and stenosis of bilateral carotid arteries: Secondary | ICD-10-CM

## 2021-05-22 DIAGNOSIS — I480 Paroxysmal atrial fibrillation: Secondary | ICD-10-CM | POA: Insufficient documentation

## 2021-05-22 DIAGNOSIS — E119 Type 2 diabetes mellitus without complications: Secondary | ICD-10-CM

## 2021-05-22 HISTORY — DX: Dehydration: E86.0

## 2021-05-22 HISTORY — DX: Paroxysmal atrial fibrillation: I48.0

## 2021-05-22 HISTORY — DX: Acute candidiasis of vulva and vagina: B37.31

## 2021-05-22 HISTORY — DX: Other reduced mobility: Z74.09

## 2021-05-22 HISTORY — DX: Weakness: R53.1

## 2021-05-22 NOTE — Progress Notes (Signed)
?Cardiology Office Note:   ? ?Date:  05/22/2021  ? ?ID:  Marilyn Robinson, DOB 09-20-1933, MRN [id number] ? ?PCP:  Marilyn Broker, MD  ?Cardiologist:  Marilyn Campus, MD   ? ?Referring MD: Marilyn Broker, MD  ? ?Chief Complaint  ?Patient presents with  ? Medical Clearance  ?  Lumpectomy L breast Dr. Noberto Robinson 05/28/21  ? ? ?History of Present Illness:   ? ?Marilyn Robinson is a 88 y.o. female presenting a pleasure to take care of many years ago.  She did have Takotsubo cardiomyopathy at that time.  Diminished ejection fraction, cardiac catheterization done show 50% blockage in the right coronary artery that was in 2005.  Since then she had improvement left ventricle ejection fraction, she does have diastolic dysfunction, bilateral carotic arterial stenosis but not critical does not required any intervention, hypertension, hyperlipidemia, history of CVA, paroxysmal atrial fibrillation, since that time she is being anticoagulated.  But since she does have difficulty tolerating anticoagulation she has been scheduled related to have Watchman device inserted.  He was sent to [place] urgently to be evaluated before in coming breast surgery apparently she was find to have invasive breast cancer and either lumpectomy with sentinel node resection or total mastectomy is being planned.  I talked to her surgeons about approach to this procedure she will be done under general esthesia.  Her ability to exercise is very limited.  She still very active but when she goes to store she uses a riding cart, she does have few steps going to her house however recently ramp has been built so can use it to ease off.  Basically I cannot determine if she is does have ability to do 4 METS.  Luckily she denies have any chest pain tightness squeezing pressure burning chest. ? ?Past Medical History:  ?Diagnosis Date  ? Anemia 07/02/2016  ? Bilateral carotid artery stenosis 09/22/2015  ? BMI 31.0-31.9,adult 05/06/2015  ? Breast cancer ([place])   ?  Chronic combined systolic and diastolic heart failure ([place]) 07/17/2014  ? Overview:  EF 56% 09/23/10  ? Diabetes mellitus without complication ([place])   ? Dislocation of right knee with lateral meniscus tear 03/15/2014  ? Ductal carcinoma in situ (DCIS) of left breast 05/06/2015  ? GERD (gastroesophageal reflux disease)   ? Hyperlipidemia 07/17/2014  ? Hypertension   ? Hypertensive heart disease with heart failure ([place]) 07/17/2014  ? Hypertensive urgency 09/22/2015  ? Hypokalemia 09/22/2015  ? Hyponatremia 05/29/2015  ? Mild CAD 07/17/2014  ? Overview:  50% RCA stenosis in 2005  ? Non morbid obesity 05/06/2015  ? Pyuria 09/22/2015  ? Right knee pain 05/17/2014  ? S/P arthroscopy of knee 05/17/2014  ? Stroke-like episode 05/29/2015  ? Stroke-like symptoms 05/29/2015  ? TIA (transient ischemic attack)   ? Uterine prolapse 07/02/2016  ? ? ?Past Surgical History:  ?Procedure Laterality Date  ? BREAST BIOPSY    ? CATARACT EXTRACTION    ? CHOLECYSTECTOMY    ? CORONARY ANGIOPLASTY WITH STENT PLACEMENT    ? EYE SURGERY    ? TUBAL LIGATION    ? ? ?Current Medications: ?Current Meds  ?Medication Sig  ? ACCU-CHEK GUIDE test strip 1 each by Other route as needed for other (GLucose check).  ? acetaminophen (TYLENOL) 500 MG tablet Take 500 mg by mouth every 6 (six) hours as needed for mild pain.  ? carvedilol (COREG) 25 MG tablet Take 25 mg by mouth 2 (two) times daily.   ? cloNIDine (  CATAPRES) 0.1 MG tablet Take 1 tablet (0.1 mg total) by mouth daily as needed (Take if your blood pressure is >190 on top).  ? cyanocobalamin (,VITAMIN B-12,) 1000 MCG/ML injection Inject 1 mL into the muscle every 30 (thirty) days.  ? ELIQUIS 2.5 MG TABS tablet Take 2.5 mg by mouth 2 (two) times daily.  ? furosemide (LASIX) 20 MG tablet Take 20 mg by mouth daily as needed for edema.  ? hydrALAZINE (APRESOLINE) 25 MG tablet Take 25 mg by mouth 3 (three) times daily.  ? Meclizine HCl 25 MG CHEW Chew 1 tablet by mouth 3 (three) times daily as needed (dizziness).  ? metFORMIN  (GLUCOPHAGE-XR) 500 MG 24 hr tablet Take 500 mg by mouth See admin instructions. 500 mg in the morning and 1,000 at night  ? mirtazapine (REMERON) 15 MG tablet Take 15 mg by mouth daily.   ? olmesartan (BENICAR) 40 MG tablet Take 1 tablet by mouth daily.  ? pantoprazole (PROTONIX) 40 MG tablet Take 40 mg by mouth daily.  ? rosuvastatin (CRESTOR) 10 MG tablet Take 10 mg by mouth every other day.  ?  ? ?Allergies:   Antihistamines, diphenhydramine-type; Lipitor [atorvastatin]; Penicillins; Allopurinol; Aspirin; and Fenofibrate  ? ?Social History  ? ?Socioeconomic History  ? Marital status: Married  ?  Spouse name: Not on file  ? Number of children: Not on file  ? Years of education: Not on file  ? Highest education level: Not on file  ?Occupational History  ? Not on file  ?Tobacco Use  ? Smoking status: Former  ? Smokeless tobacco: Never  ?Vaping Use  ? Vaping Use: Never used  ?Substance and Sexual Activity  ? Alcohol use: No  ? Drug use: No  ? Sexual activity: Not on file  ?Other Topics Concern  ? Not on file  ?Social History Narrative  ? Not on file  ? ?Social Determinants of Health  ? ?Financial Resource Strain: Not on file  ?Food Insecurity: Not on file  ?Transportation Needs: Not on file  ?Physical Activity: Not on file  ?Stress: Not on file  ?Social Connections: Not on file  ?  ? ?Family History: ?The patient's family history includes CAD in her father and mother; Diabetes in her father and mother; Stroke in her mother. ?ROS:   ?Please see the history of present illness.    ?All 14 point review of systems negative except as described per history of present illness ? ?EKGs/Labs/Other Studies Reviewed:   ? ? ? ?Recent Labs: ?03/18/2021: BUN 22; Creatinine, Ser 1.20; Potassium 4.8; Sodium 131 ?04/29/2021: Hemoglobin 10.8; Platelets 281  ?Recent Lipid Panel ?   ?Component Value Date/Time  ? CHOL 119 08/19/2016 1042  ? TRIG 107 08/19/2016 1042  ? HDL 41 08/19/2016 1042  ? CHOLHDL 3.3 05/29/2015 1549  ? VLDL 22  05/29/2015 1549  ? [place] 57 08/19/2016 1042  ? ? ?Physical Exam:   ? ?VS:  BP 138/72 (BP Location: Left Arm, Patient Position: Sitting)   Pulse 61   Ht '5\' 6"'$  (1.676 m)   Wt 164 lb (74.4 kg)   SpO2 97%   BMI 26.47 kg/m?    ? ?Wt Readings from Last 3 Encounters:  ?05/22/21 164 lb (74.4 kg)  ?05/06/21 166 lb (75.3 kg)  ?03/18/21 161 lb 9.6 oz (73.3 kg)  ?  ? ?GEN:  Well nourished, well developed in no acute distress ?HEENT: Normal ?NECK: No JVD; No carotid bruits ?LYMPHATICS: No lymphadenopathy ?CARDIAC: RRR, no murmurs, no  rubs, no gallops ?RESPIRATORY:  Clear to auscultation without rales, wheezing or rhonchi  ?ABDOMEN: Soft, non-tender, non-distended ?MUSCULOSKELETAL:  No edema; No deformity  ?SKIN: Warm and dry ?LOWER EXTREMITIES: no swelling ?NEUROLOGIC:  Alert and oriented x 3 ?PSYCHIATRIC:  Normal affect  ? ?ASSESSMENT:   ? ?1. Pre-op evaluation   ?2. Paroxysmal atrial fibrillation (HCC)   ?3. Diabetes mellitus with complication ([place])   ?4. Preop cardiovascular exam   ?5. Bilateral carotid artery stenosis   ?6. Mild CAD   ?7. Primary hypertension   ?8. Diabetes mellitus without complication ([place])   ? ?PLAN:   ? ?In order of problems listed above: ? ?Cardiovascular preop evaluation for this lady with peripheral vascular disease documented also documented coronary artery disease with cardiac catheterization 2005 showing 50% RCA.  If she does not do 4 METS on her physical exercise therefore I think we need to stress her make sure she does not have any inducible ischemia.  Therefore, she will be scheduled to have [place] before procedure. ?Paroxysmal atrial fibrillation maintained sinus rhythm she is scheduled to have Watchman device. ?Bilateral carotic arterial stenosis.  Not critical.  We will continue monitoring continue risk factors modifications. ?Essential hypertension blood pressure well controlled continue present management. ?If her stress test is negative she will be safe to proceed with surgery  under general anesthesia. ? ? ?Medication Adjustments/Labs and Tests Ordered: ?Current medicines are reviewed at length with the patient today.  Concerns regarding medicines are outlined above.  ?Orders Placed This Encoun

## 2021-05-22 NOTE — Patient Instructions (Addendum)
Medication Instructions:  ?Your physician recommends that you continue on your current medications as directed. Please refer to the Current Medication list given to you today.  ?*If you need a refill on your cardiac medications before your next appointment, please call your pharmacy* ? ? ?Lab Work: ?None Ordered ?If you have labs (blood work) drawn today and your tests are completely normal, you will receive your results only by: ?MyChart Message (if you have MyChart) OR ?A paper copy in the mail ?If you have any lab test that is abnormal or we need to change your treatment, we will call you to review the results. ? ? ?Testing/Procedures: ?Your physician has requested that you have a lexiscan myoview. For further information please visit [email or web address]. Please follow instruction sheet, as given. ? ?The test will take approximately 3 to 4 hours to complete; you may bring reading material.  If someone comes with you to your appointment, they will need to remain in the main lobby due to limited space in the testing area. ? ?How to prepare for your Myocardial Perfusion Test: ?Do not eat or drink 3 hours prior to your test, except you may have water. ?Do not consume products containing caffeine (regular or decaffeinated) 12 hours prior to your test. (ex: coffee, chocolate, sodas, tea). ?Do bring a list of your current medications with you.  If not listed below, you may take your medications as normal. ?Do wear comfortable clothes (no dresses or overalls) and walking shoes, tennis shoes preferred (No heels or open toe shoes are allowed). ?Do NOT wear cologne, perfume, aftershave, or lotions (deodorant is allowed). ?If these instructions are not followed, your test will have to be rescheduled.   ? ? ?Follow-Up: ?At [facility], you and your health needs are our priority.  As part of our continuing mission to provide you with exceptional heart care, we have created designated Provider Care Teams.  These Care Teams  include your primary Cardiologist (physician) and Advanced Practice Providers (APPs -  Physician Assistants and Nurse Practitioners) who all work together to provide you with the care you need, when you need it. ? ?We recommend signing up for the patient portal called "MyChart".  Sign up information is provided on this After Visit Summary.  MyChart is used to connect with patients for Virtual Visits (Telemedicine).  Patients are able to view lab/test results, encounter notes, upcoming appointments, etc.  Non-urgent messages can be sent to your provider as well.   ?To learn more about what you can do with MyChart, go to [email or web address].   ? ?Your next appointment:   ?3 month(s) ? ?The format for your next appointment:   ?In Person ? ?Provider:   ?Jenne Campus, MD  ? ? ?Other Instructions ?NA  ?

## 2021-05-23 LAB — CBC
Hematocrit: 33.3 % — ABNORMAL LOW (ref 34.0–46.6)
Hemoglobin: 10.8 g/dL — ABNORMAL LOW (ref 11.1–15.9)
MCH: 29.4 pg (ref 26.6–33.0)
MCHC: 32.4 g/dL (ref 31.5–35.7)
MCV: 91 fL (ref 79–97)
Platelets: 269 10*3/uL (ref 150–450)
RBC: 3.67 x10E6/uL — ABNORMAL LOW (ref 3.77–5.28)
RDW: 12.9 % (ref 11.7–15.4)
WBC: 8.5 10*3/uL (ref 3.4–10.8)

## 2021-05-23 LAB — HEMOGLOBIN A1C
Est. average glucose Bld gHb Est-mCnc: 148 mg/dL
Hgb A1c MFr Bld: 6.8 % — ABNORMAL HIGH (ref 4.8–5.6)

## 2021-05-23 NOTE — Addendum Note (Signed)
Addended by: Jacobo Forest D on: 05/23/2021 07:35 AM ? ? Modules accepted: Orders ? ?

## 2021-05-26 ENCOUNTER — Telehealth: Payer: Self-pay

## 2021-05-26 NOTE — Telephone Encounter (Signed)
Marilyn Robinson saw Dr. Agustin Cree last week for surgical clearance while Dr. Bettina Gavia was out of the office. ?Dr. Agustin Cree scheduled her for [facility] tomorrow to clear her. ?She is currently holding her Eliquis awaiting a lumpectomy 5/16 and is very nervous about holding Eliquis and doing a stress test. ?She wants to know from Dr. Bettina Gavia if the stress test is 100% necessary to be cleared for the biopsy. ?She requests a call back from Dr. Bettina Gavia or his nurse today. ?

## 2021-05-26 NOTE — Telephone Encounter (Signed)
Called patient and informed her of Dr. Joya Gaskins response regarding the need to have a stress test. She also wanted to know what her A1c lab result was and I informed her of the result. Patient had no further questions at this time. ?

## 2021-05-27 ENCOUNTER — Telehealth: Payer: Self-pay

## 2021-05-27 ENCOUNTER — Ambulatory Visit (INDEPENDENT_AMBULATORY_CARE_PROVIDER_SITE_OTHER): Payer: PPO

## 2021-05-27 DIAGNOSIS — Z01810 Encounter for preprocedural cardiovascular examination: Secondary | ICD-10-CM

## 2021-05-27 DIAGNOSIS — Z01818 Encounter for other preprocedural examination: Secondary | ICD-10-CM

## 2021-05-27 LAB — MYOCARDIAL PERFUSION IMAGING
LV dias vol: 82 mL (ref 46–106)
LV sys vol: 35 mL
Nuc Stress EF: 58 %
Peak HR: 68 {beats}/min
Rest HR: 59 {beats}/min
Rest Nuclear Isotope Dose: 10.6 mCi
SDS: 3
SRS: 5
SSS: 8
ST Depression (mm): 0 mm
Stress Nuclear Isotope Dose: 28.9 mCi
TID: 1.01

## 2021-05-27 MED ORDER — TECHNETIUM TC 99M TETROFOSMIN IV KIT
28.9000 | PACK | Freq: Once | INTRAVENOUS | Status: AC | PRN
  Administered 2021-05-27: 28.9 via INTRAVENOUS

## 2021-05-27 MED ORDER — TECHNETIUM TC 99M TETROFOSMIN IV KIT
10.6000 | PACK | Freq: Once | INTRAVENOUS | Status: AC | PRN
Start: 2021-05-27 — End: 2021-05-27
  Administered 2021-05-27: 10.6 via INTRAVENOUS

## 2021-05-27 MED ORDER — REGADENOSON 0.4 MG/5ML IV SOLN
0.4000 mg | Freq: Once | INTRAVENOUS | Status: AC
  Administered 2021-05-27: 0.4 mg via INTRAVENOUS

## 2021-05-27 NOTE — Telephone Encounter (Signed)
LM to return my call. 

## 2021-05-27 NOTE — Telephone Encounter (Signed)
? ? ?  Patient Name: Marilyn Robinson  ?DOB: [birth year] ?MRN: [id number] ? ?Primary Cardiologist: Jenne Campus, MD ? ?Chart reviewed as part of pre-operative protocol coverage. Given past medical history and time since last visit, based on ACC/AHA guidelines, Marilyn Robinson would be at acceptable risk for the planned procedure without further cardiovascular testing.  Per Dr. Agustin Cree, recent stress test was normal and patient is therefore cleared to proceed with surgery. ? ?Patient with diagnosis of afib on Eliquis for anticoagulation.   ?  ?Procedure: needle localized lumpectomy and/or mastectomy ?Date of procedure: TBD ?  ?CHA2DS2-VASc Score = 9  ?This indicates a 12.2% annual risk of stroke. ?The patient's score is based upon: ?CHF History: 1 ?HTN History: 1 ?Diabetes History: 1 ?Stroke History: 2 ?Vascular Disease History: 1 ?Age Score: 2 ?Gender Score: 1 ?  ?CrCl 23m/min ?Platelet count 281K ?  ?Has both high stroke risk and high bleed risk. On lower dose of Eliquis although she qualifies for full dose due to her hx of recurrent GI bleeding, potentially pending Watchman procedure. Had previously been on Plavix every other day, this is no longer on her med list. Most recent stroke in January 2023. ?  ?Per office protocol, patient can hold Eliquis for 1 day prior to procedure. If longer hold is required, will need MD clearance given elevated CV risk. She should resume anticoag as soon as safely possible after procedure. ? ?I will route this recommendation to the requesting party via Epic fax function and remove from pre-op pool. ? ?Please call with questions. ? ?Marilyn Sciara NP ?05/27/2021, 4:35 PM ? ?

## 2021-05-27 NOTE — Telephone Encounter (Signed)
Patient notified of results.

## 2021-05-27 NOTE — Telephone Encounter (Signed)
-----   Message from Park Liter, MD sent at 05/23/2021 12:40 PM EDT ----- ?All labs are stable, mild anemia but perfectly stable, hemoglobin A1c the same as before and looks actually good ?

## 2021-05-27 NOTE — Telephone Encounter (Signed)
? ?  Patient Name: Marilyn Robinson  ?DOB: July 04, 1933 ?MRN: [id number] ? ?Primary Cardiologist: Jenne Campus, MD ? ?Chart reviewed as part of pre-operative protocol coverage. Surgical clearance with recommendations forwarded to requesting party. See Telephone encounter with original clearance request dated 05/20/2021.  ? ?Thank you.  ? ?Lenna Sciara, NP ?05/27/2021, 4:42 PM ? ? ?

## 2021-05-27 NOTE — Addendum Note (Signed)
Addended by: Jenne Campus on: 05/27/2021 02:14 PM ? ? Modules accepted: Orders ? ?

## 2021-05-27 NOTE — Telephone Encounter (Signed)
Pt returning call in regards to results. Pt is requesting to be called back today before her appt tomorrow to know results.  ?

## 2021-05-27 NOTE — Telephone Encounter (Signed)
-----   Message from Park Liter, MD sent at 05/27/2021  3:44 PM EDT ----- ?Stress test normal, good to go with cardiac catheterization ?

## 2021-06-02 ENCOUNTER — Ambulatory Visit
Admission: RE | Admit: 2021-06-02 | Discharge: 2021-06-02 | Disposition: A | Discharge status: Home / Self Care | Payer: PPO | Source: Ambulatory Visit | Attending: Vascular Surgery | Admitting: Vascular Surgery

## 2021-06-02 ENCOUNTER — Other Ambulatory Visit: Payer: Self-pay

## 2021-06-02 DIAGNOSIS — I700 Atherosclerosis of aorta: Secondary | ICD-10-CM

## 2021-06-06 ENCOUNTER — Ambulatory Visit (HOSPITAL_COMMUNITY)
Admission: RE | Admit: 2021-06-06 | Discharge: 2021-06-06 | Disposition: A | Discharge status: Home / Self Care | Payer: PPO | Source: Ambulatory Visit | Attending: Vascular Surgery | Admitting: Vascular Surgery

## 2021-06-06 DIAGNOSIS — I700 Atherosclerosis of aorta: Secondary | ICD-10-CM | POA: Diagnosis present

## 2021-06-06 LAB — POCT I-STAT CREATININE: Creatinine, Ser: 1.2 mg/dL — ABNORMAL HIGH (ref 0.44–1.00)

## 2021-06-06 MED ORDER — IOHEXOL 350 MG/ML SOLN
100.0000 mL | Freq: Once | INTRAVENOUS | Status: AC | PRN
  Administered 2021-06-06: 100 mL via INTRAVENOUS

## 2021-06-10 ENCOUNTER — Encounter: Payer: Self-pay | Admitting: Vascular Surgery

## 2021-06-10 ENCOUNTER — Ambulatory Visit (INDEPENDENT_AMBULATORY_CARE_PROVIDER_SITE_OTHER): Payer: PPO | Admitting: Vascular Surgery

## 2021-06-10 VITALS — BP 211/74 | HR 61 | Temp 97.3°F | Resp 14 | Ht 66.0 in | Wt 166.0 lb

## 2021-06-10 DIAGNOSIS — I700 Atherosclerosis of aorta: Secondary | ICD-10-CM | POA: Diagnosis not present

## 2021-06-10 NOTE — Progress Notes (Signed)
Patient name: Marilyn Robinson MRN: [id number] DOB: [birth year] Sex: female  REASON FOR CONSULT: F/U after CTA chest for evaluation of focal penetrating atherosclerotic ulcer  HPI: Marilyn Robinson is a 88 y.o. female, with history of atrial fibrillation, hypertension, hyperlipidemia, diabetes, breast cancer that presents for follow-up after CTA chest for further evaluation of a focal penetrating atherosclerotic ulcer of the descending thoracic aorta.  Patient was undergoing work-up for the Watchman procedure with Dr. Quentin Ore.  As part of that she had a CTA chest abdomen pelvis.  The CT was done on 04/10/2021 showing mixed atherosclerotic plaque throughout the thoracic and abdominal aorta with a focal ulceration of the mid descending thoracic aorta measuring 1.6 x 1.1 cm in breadth and depth.  Patient denies any chest or back pain.   After initial evaluation we sent her for CT scan for 1 month follow-up.  No new concerns today otherwise.  Past Medical History:  Diagnosis Date   Anemia 07/02/2016   Bilateral carotid artery stenosis 09/22/2015   BMI 31.0-31.9,adult 05/06/2015   Breast cancer (HCC)    Chronic combined systolic and diastolic heart failure ([place]) 07/17/2014   Overview:  EF 56% 09/23/10   Diabetes mellitus without complication (HCC)    Dislocation of right knee with lateral meniscus tear 03/15/2014   Ductal carcinoma in situ (DCIS) of left breast 05/06/2015   GERD (gastroesophageal reflux disease)    Hyperlipidemia 07/17/2014   Hypertension    Hypertensive heart disease with heart failure ([place]) 07/17/2014   Hypertensive urgency 09/22/2015   Hypokalemia 09/22/2015   Hyponatremia 05/29/2015   Mild CAD 07/17/2014   Overview:  50% RCA stenosis in 2005   Non morbid obesity 05/06/2015   Pyuria 09/22/2015   Right knee pain 05/17/2014   S/P arthroscopy of knee 05/17/2014   Stroke-like episode 05/29/2015   Stroke-like symptoms 05/29/2015   TIA (transient ischemic attack)    Uterine prolapse 07/02/2016     Past Surgical History:  Procedure Laterality Date   BREAST BIOPSY     CATARACT EXTRACTION     CHOLECYSTECTOMY     CORONARY ANGIOPLASTY WITH STENT PLACEMENT     EYE SURGERY     TUBAL LIGATION      Family History  Problem Relation Age of Onset   CAD Mother    Diabetes Mother    Stroke Mother    CAD Father    Diabetes Father     SOCIAL HISTORY: Social History   Socioeconomic History   Marital status: Married    Spouse name: Not on file   Number of children: Not on file   Years of education: Not on file   Highest education level: Not on file  Occupational History   Not on file  Tobacco Use   Smoking status: Former   Smokeless tobacco: Never  Vaping Use   Vaping Use: Never used  Substance and Sexual Activity   Alcohol use: No   Drug use: No   Sexual activity: Not on file  Other Topics Concern   Not on file  Social History Narrative   Not on file   Social Determinants of Health   Financial Resource Strain: Not on file  Food Insecurity: Not on file  Transportation Needs: Not on file  Physical Activity: Not on file  Stress: Not on file  Social Connections: Not on file  Intimate Partner Violence: Not on file    Allergies  Allergen Reactions   Antihistamines, Diphenhydramine-Type Other (See Comments)  Causes headaches   Lipitor [Atorvastatin] Other (See Comments)    Caused muscle weakness, but can tolerate a low dose of Crestor   Penicillins     Has patient had a PCN reaction causing immediate rash, facial/tongue/throat swelling, SOB or lightheadedness with hypotension: Yes Has patient had a PCN reaction causing severe rash involving [id number] membranes or skin necrosis: No Has patient had a PCN reaction that required hospitalization: No Has patient had a PCN reaction occurring within the last 10 years: No If all of the above answers are "NO", then may proceed with Cephalosporin use.    Allopurinol Rash   Aspirin Nausea And Vomiting and Other  (See Comments)    bleeding   Fenofibrate Rash    Pruritus, Rash    Current Outpatient Medications  Medication Sig Dispense Refill   ACCU-CHEK GUIDE test strip 1 each by Other route as needed for other (GLucose check).     acetaminophen (TYLENOL) 500 MG tablet Take 500 mg by mouth every 6 (six) hours as needed for mild pain.     carvedilol (COREG) 25 MG tablet Take 25 mg by mouth 2 (two) times daily.      cloNIDine (CATAPRES) 0.1 MG tablet Take 1 tablet (0.1 mg total) by mouth daily as needed (Take if your blood pressure is >190 on top). 30 tablet 3   cyanocobalamin (,VITAMIN B-12,) 1000 MCG/ML injection Inject 1 mL into the muscle every 30 (thirty) days.     ELIQUIS 2.5 MG TABS tablet Take 2.5 mg by mouth 2 (two) times daily.     furosemide (LASIX) 20 MG tablet Take 20 mg by mouth daily as needed for edema.     hydrALAZINE (APRESOLINE) 25 MG tablet Take 25 mg by mouth 3 (three) times daily.     Meclizine HCl 25 MG CHEW Chew 1 tablet by mouth 3 (three) times daily as needed (dizziness).     metFORMIN (GLUCOPHAGE-XR) 500 MG 24 hr tablet Take 500 mg by mouth See admin instructions. 500 mg in the morning and 1,000 at night     mirtazapine (REMERON) 15 MG tablet Take 15 mg by mouth daily.      olmesartan (BENICAR) 40 MG tablet Take 1 tablet by mouth daily.     pantoprazole (PROTONIX) 40 MG tablet Take 40 mg by mouth daily.     rosuvastatin (CRESTOR) 10 MG tablet Take 10 mg by mouth every other day.     No current facility-administered medications for this visit.    REVIEW OF SYSTEMS:  '[X]'$  denotes positive finding, '[ ]'$  denotes negative finding Cardiac  Comments:  Chest pain or chest pressure:    Shortness of breath upon exertion:    Short of breath when lying flat:    Irregular heart rhythm:        Vascular    Pain in calf, thigh, or hip brought on by ambulation:    Pain in feet at night that wakes you up from your sleep:     Blood clot in your veins:    Leg swelling:          Pulmonary    Oxygen at home:    Productive cough:     Wheezing:         Neurologic    Sudden weakness in arms or legs:     Sudden numbness in arms or legs:     Sudden onset of difficulty speaking or slurred speech:    Temporary loss of vision in one  eye:     Problems with dizziness:         Gastrointestinal    Blood in stool:     Vomited blood:         Genitourinary    Burning when urinating:     Blood in urine:        Psychiatric    Major depression:         Hematologic    Bleeding problems:    Problems with blood clotting too easily:        Skin    Rashes or ulcers:        Constitutional    Fever or chills:      PHYSICAL EXAM: Vitals:   06/10/21 1417 06/10/21 1421  BP: (!) 215/87 (!) 211/74  Pulse: 61 61  Resp: 14   Temp: (!) 97.3 F (36.3 C)   TempSrc: Temporal   SpO2: 98%   Weight: 166 lb (75.3 kg)   Height: '5\' 6"'$  (1.676 m)     GENERAL: The patient is a well-nourished female, in no acute distress. The vital signs are documented above. CARDIAC: There is a regular rate and rhythm.  VASCULAR:  Palpable femoral pulses bilaterally PULMONARY: No respiratory distress. ABDOMEN: Soft and non-tender. MUSCULOSKELETAL: There are no major deformities or cyanosis. NEUROLOGIC: No focal weakness or paresthesias are detected. PSYCHIATRIC: The patient has a normal affect.  DATA:   Repeat CTA Chest reviewed from 06/06/21 with no interval change of focal PAU in the descending thoracic aorta.  CTA reviewed from 04/10/2021 and shows a focal penetrating aortic ulcer in the descending thoracic aorta measuring about 1.1 cm deep (aortic diameter here about 2.6 cm)  Assessment/Plan:  88 year old female presents for evaluation of a incidentally discovered penetrating aortic ulcer in her descending thoracic aorta discovered on CTA during work-up of Watchman procedure through cardiology.  Discussed I have reviewed her repeat CTA that shows no interval change of this focal  penetrating aortic ulcer in the descending thoracic aorta.  I would be comfortable observing this at this time and I do not feel it needs surgical intervention at this time.  She otherwise has no symptoms.  Again I think this is an incidental finding.  I will see her in 6 months with a repeat CTA chest for continued surveillance.   Marty Heck, MD [facility] Office: [phone]

## 2021-06-11 ENCOUNTER — Telehealth: Payer: Self-pay

## 2021-06-11 NOTE — Telephone Encounter (Signed)
Spoke with the patient in detail about her recent medical appointments.  She was diagnosed with breast cancer again and has been followed by Dr. Carlis Abbott for her atherosclerotic ulcer found on pre-Watchman CT. She states she had lymph nodes removed and was told she went from a "stage III to stage I cancer." She has follow-up with her oncologist 6/9 to decide course of treatment. She is under the impression it will be PO hormones.  She said Dr. Carlis Abbott told her her ulcer has not changed and is likely an incidental finding. He will follow her again at 6 months and then yearly if stable.  Throughout the whole process, she has been adamant about getting the Watchman implant. She would like to be done as soon as she can since she is doing so well.  She understands Dr. Quentin Ore will need to see her again so they can decide together to proceed with LAAO - scheduled her 6/15 for check-up.  She understands a spot is being held on 07/17/2021 for implant, and that she will get instructions for the procedure at the 6/15 visit if proceeding. She was grateful for call and agrees with plan.

## 2021-06-15 NOTE — Progress Notes (Unsigned)
Cardiology Office Note:    Date:  06/17/2021   ID:  Marilyn Robinson, DOB Mar 03, 1933, MRN [id number]  PCP:  Myrlene Broker, MD  Cardiologist:  Shirlee More, MD    Referring MD: Myrlene Broker, MD    ASSESSMENT:    1. Paroxysmal atrial fibrillation (HCC)   2. Chronic anticoagulation   3. Hypertensive heart disease with heart failure ([place])   4. Penetrating atherosclerotic ulcer of aorta ([place])   5. Mild CAD   6. Mixed dyslipidemia    PLAN:    In order of problems listed above:  For now continue reduced dose anticoagulant so far Fortunately she has not had recurrent GI bleeding but is not a good candidate for long-term anticoagulation and strongly encouraged her to follow through and have the procedure.  She has had no clinical recurrence of atrial fibrillation Blood pressure is always labile repeat is more reasonable to continue her multidrug regimen available clonidine furosemide hydralazine and Benicar for very resistant hypertension Stable after evaluation vascular surgery Stable CAD having no anginal discomfort With stroke CAD she will continue her high intensity statin   Next appointment: 6 months   Medication Adjustments/Labs and Tests Ordered: Current medicines are reviewed at length with the patient today.  Concerns regarding medicines are outlined above.  No orders of the defined types were placed in this encounter.  No orders of the defined types were placed in this encounter.   Chief Complaint  Patient presents with   Follow-up   Atrial Fibrillation   Anticoagulation    History of Present Illness:    Marilyn Robinson is a 88 y.o. female with a hx of hypertensive heart disease with heart failure coronary artery disease dyslipidemia recurrent symptomatic hyponatremia iron deficiency history of recurrent stroke and paroxysmal atrial fibrillation complicated by recurrent GI bleeding last seen by me 02/19/2021 tolerating low-dose anticoagulant and referred  for Watchman evaluation.  Seen by vascular surgery when CTA for watchman evaluation showed the presence of a focal penetrating atherosclerotic ulcer in the descending thoracic aorta 1.1 cm in depth.  She had a repeat CTA that showed no progression and no intervention was recommended.  She has serial hemoglobins performed because of recurrent bleeding which has been stable at 10.8.  Unfortunately she also developed breast cancer and had localized lumpectomy and sentinel lymph node biopsy stage Ib.  Compliance with diet, lifestyle and medications: Yes  Despite the other problems breast cancer surgery and penetrating atherosclerotic ulcer in her aorta she is planned for Watchman device in January So far we have had no bleeding complication from her anticoagulant She has made a good recovery from her stroke stable ambulation no falls no edema chest pain shortness of breath palpitation or syncope Past Medical History:  Diagnosis Date   Anemia 07/02/2016   Bilateral carotid artery stenosis 09/22/2015   BMI 31.0-31.9,adult 05/06/2015   Breast cancer ([place])    Chronic combined systolic and diastolic heart failure ([place]) 07/17/2014   Overview:  EF 56% 09/23/10   Diabetes mellitus without complication ([place])    Dislocation of right knee with lateral meniscus tear 03/15/2014   Ductal carcinoma in situ (DCIS) of left breast 05/06/2015   GERD (gastroesophageal reflux disease)    Hyperlipidemia 07/17/2014   Hypertension    Hypertensive heart disease with heart failure ([place]) 07/17/2014   Hypertensive urgency 09/22/2015   Hypokalemia 09/22/2015   Hyponatremia 05/29/2015   Mild CAD 07/17/2014   Overview:  50% RCA stenosis in 2005  Non morbid obesity 05/06/2015   Pyuria 09/22/2015   Right knee pain 05/17/2014   S/P arthroscopy of knee 05/17/2014   Stroke-like episode 05/29/2015   Stroke-like symptoms 05/29/2015   TIA (transient ischemic attack)    Uterine prolapse 07/02/2016    Past Surgical History:  Procedure Laterality  Date   BREAST BIOPSY     CATARACT EXTRACTION     CHOLECYSTECTOMY     CORONARY ANGIOPLASTY WITH STENT PLACEMENT     EYE SURGERY     TUBAL LIGATION      Current Medications: No outpatient medications have been marked as taking for the 06/17/21 encounter (Office Visit) with Richardo Priest, MD.     Allergies:   Antihistamines, diphenhydramine-type; Lipitor [atorvastatin]; Penicillins; Allopurinol; Aspirin; and Fenofibrate   Social History   Socioeconomic History   Marital status: Married    Spouse name: Not on file   Number of children: Not on file   Years of education: Not on file   Highest education level: Not on file  Occupational History   Not on file  Tobacco Use   Smoking status: Former    Passive exposure: Past   Smokeless tobacco: Never  Vaping Use   Vaping Use: Never used  Substance and Sexual Activity   Alcohol use: No   Drug use: No   Sexual activity: Not on file  Other Topics Concern   Not on file  Social History Narrative   Not on file   Social Determinants of Health   Financial Resource Strain: Not on file  Food Insecurity: Not on file  Transportation Needs: Not on file  Physical Activity: Not on file  Stress: Not on file  Social Connections: Not on file     Family History: The patient's family history includes CAD in her father and mother; Diabetes in her father and mother; Stroke in her mother. ROS:   Please see the history of present illness.    All other systems reviewed and are negative.  EKGs/Labs/Other Studies Reviewed:    The following studies were reviewed today:  Recent Labs: 03/18/2021: BUN 22; Potassium 4.8; Sodium 131 05/22/2021: Hemoglobin 10.8; Platelets 269 06/06/2021: Creatinine, Ser 1.20  Recent Lipid Panel    Component Value Date/Time   CHOL 119 08/19/2016 1042   TRIG 107 08/19/2016 1042   HDL 41 08/19/2016 1042   CHOLHDL 3.3 05/29/2015 1549   VLDL 22 05/29/2015 1549   LDLCALC 57 08/19/2016 1042    Physical Exam:     VS:  BP (!) 216/92 (BP Location: Right Arm, Patient Position: Sitting)   Pulse 66   Ht '5\' 6"'$  (1.676 m)   Wt 168 lb 12.8 oz (76.6 kg)   SpO2 99%   BMI 27.25 kg/m     Wt Readings from Last 3 Encounters:  06/17/21 168 lb 12.8 oz (76.6 kg)  06/10/21 166 lb (75.3 kg)  05/27/21 164 lb (74.4 kg)     GEN: She looks her age not as frail as the last visit well nourished, well developed in no acute distress HEENT: Normal NECK: No JVD; No carotid bruits LYMPHATICS: No lymphadenopathy CARDIAC: RRR, no murmurs, rubs, gallops RESPIRATORY:  Clear to auscultation without rales, wheezing or rhonchi  ABDOMEN: Soft, non-tender, non-distended MUSCULOSKELETAL:  No edema; No deformity  SKIN: Warm and dry NEUROLOGIC:  Alert and oriented x 3 PSYCHIATRIC:  Normal affect    Signed, Shirlee More, MD  06/17/2021 3:42 PM    [facility]

## 2021-06-17 ENCOUNTER — Encounter: Payer: Self-pay | Admitting: Cardiology

## 2021-06-17 ENCOUNTER — Ambulatory Visit: Payer: PPO | Admitting: Oncology

## 2021-06-17 ENCOUNTER — Ambulatory Visit (INDEPENDENT_AMBULATORY_CARE_PROVIDER_SITE_OTHER): Payer: PPO | Admitting: Cardiology

## 2021-06-17 VITALS — BP 162/70 | HR 66 | Ht 66.0 in | Wt 168.8 lb

## 2021-06-17 DIAGNOSIS — I719 Aortic aneurysm of unspecified site, without rupture: Secondary | ICD-10-CM | POA: Diagnosis not present

## 2021-06-17 DIAGNOSIS — Z7901 Long term (current) use of anticoagulants: Secondary | ICD-10-CM | POA: Diagnosis not present

## 2021-06-17 DIAGNOSIS — I110 Hypertensive heart disease with heart failure: Secondary | ICD-10-CM | POA: Diagnosis not present

## 2021-06-17 DIAGNOSIS — I480 Paroxysmal atrial fibrillation: Secondary | ICD-10-CM | POA: Diagnosis not present

## 2021-06-17 DIAGNOSIS — I2510 Atherosclerotic heart disease of native coronary artery without angina pectoris: Secondary | ICD-10-CM

## 2021-06-17 DIAGNOSIS — E782 Mixed hyperlipidemia: Secondary | ICD-10-CM

## 2021-06-17 NOTE — Patient Instructions (Signed)
Medication Instructions:  Your physician recommends that you continue on your current medications as directed. Please refer to the Current Medication list given to you today.  *If you need a refill on your cardiac medications before your next appointment, please call your pharmacy*   Lab Work: None If you have labs (blood work) drawn today and your tests are completely normal, you will receive your results only by: MyChart Message (if you have MyChart) OR A paper copy in the mail If you have any lab test that is abnormal or we need to change your treatment, we will call you to review the results.   Testing/Procedures: None   Follow-Up: At [facility], you and your health needs are our priority.  As part of our continuing mission to provide you with exceptional heart care, we have created designated Provider Care Teams.  These Care Teams include your primary Cardiologist (physician) and Advanced Practice Providers (APPs -  Physician Assistants and Nurse Practitioners) who all work together to provide you with the care you need, when you need it.  We recommend signing up for the patient portal called "MyChart".  Sign up information is provided on this After Visit Summary.  MyChart is used to connect with patients for Virtual Visits (Telemedicine).  Patients are able to view lab/test results, encounter notes, upcoming appointments, etc.  Non-urgent messages can be sent to your provider as well.   To learn more about what you can do with MyChart, go to [email or web address].com.    Your next appointment:   3 month(s)  The format for your next appointment:   In Person  Provider:   Brian Munley, MD    Other Instructions None  Important Information About Sugar       

## 2021-06-17 NOTE — Addendum Note (Signed)
Addended by: Jacobo Forest D on: 06/17/2021 04:10 PM   Modules accepted: Orders

## 2021-06-18 ENCOUNTER — Other Ambulatory Visit: Payer: Self-pay | Admitting: Oncology

## 2021-06-18 ENCOUNTER — Telehealth: Payer: Self-pay | Admitting: Oncology

## 2021-06-18 ENCOUNTER — Inpatient Hospital Stay: Payer: PPO | Attending: Oncology | Admitting: Oncology

## 2021-06-18 VITALS — BP 162/70 | HR 60 | Temp 98.6°F | Resp 16 | Ht 66.0 in | Wt 167.2 lb

## 2021-06-18 DIAGNOSIS — C50412 Malignant neoplasm of upper-outer quadrant of left female breast: Secondary | ICD-10-CM

## 2021-06-18 DIAGNOSIS — Z170 Estrogen receptor positive status [ER+]: Secondary | ICD-10-CM

## 2021-06-18 LAB — CBC
Hematocrit: 32.2 % — ABNORMAL LOW (ref 34.0–46.6)
Hemoglobin: 10.6 g/dL — ABNORMAL LOW (ref 11.1–15.9)
MCH: 29.4 pg (ref 26.6–33.0)
MCHC: 32.9 g/dL (ref 31.5–35.7)
MCV: 89 fL (ref 79–97)
Platelets: 316 10*3/uL (ref 150–450)
RBC: 3.61 x10E6/uL — ABNORMAL LOW (ref 3.77–5.28)
RDW: 12.7 % (ref 11.7–15.4)
WBC: 7 10*3/uL (ref 3.4–10.8)

## 2021-06-18 MED ORDER — ANASTROZOLE 1 MG PO TABS: 1.0000 mg | ORAL_TABLET | Freq: Every day | ORAL | 3 refills | Status: DC

## 2021-06-18 NOTE — Progress Notes (Signed)
[facility]  [address] [place],  [place]  [address] [phone]  Clinic Day:  06/18/2021  Referring physician: Myrlene Broker, MD  HISTORY OF PRESENT ILLNESS:  The patient is an 88 y.o. female who I was asked to reevaluate after she has developed another focus of breast cancer.  Her history dates back to April 2023 when she noticed a mass in the upper outer aspect of her left breast.  This led to her undergoing a diagnostic mammogram in April 2023, which showed a 2.3 cm lesion in the upper outer quadrant of her left breast.  A biopsy was done of this lesion, which came back consistent with invasive ductal carcinoma.  Eventually, she went underwent a left breast lumpectomy in May 2023, whose pathology revealed a grade 3, 3.3 cm invasive ductal carcinoma that was estrogen and progesterone receptor positive, but HER2/neu receptor negative.  Her Ki-67 score was moderately elevated at 25%.  Of note, 2 sentinel lymph nodes were removed for which neither contained a microscopic focus of disease.  She comes in today to go over her breast cancer pathology and its implications.  Although her left breast has been swollen since her surgery, she is overall doing very well.  Of note, she was diagnosed with hormone positive DCIS, status post a lumpectomy in May 2015.  The patient elected not to undergo adjuvant breast radiation.  She briefly took tamoxifen, but discontinued it due to it causing numerous side effects, including undesirable weight gain and joint discomfort.    VITALS:  Blood pressure (!) 162/70, pulse 60, temperature 98.6 F (37 C), resp. rate 16, height '5\' 6"'  (1.676 m), weight 167 lb 3.2 oz (75.8 kg), SpO2 96 %.  Wt Readings from Last 3 Encounters:  06/18/21 167 lb 3.2 oz (75.8 kg)  06/17/21 168 lb 12.8 oz (76.6 kg)  06/10/21 166 lb (75.3 kg)    Body mass index is 26.99 kg/m.  Performance status (ECOG): 1 - Symptomatic but completely  ambulatory  PHYSICAL EXAM:  Physical Exam Constitutional:      Appearance: Normal appearance.  HENT:     Mouth/Throat:     Pharynx: Oropharynx is clear. No oropharyngeal exudate.  Cardiovascular:     Rate and Rhythm: Normal rate and regular rhythm.     Heart sounds: No murmur heard.   No friction rub. No gallop.  Pulmonary:     Breath sounds: Normal breath sounds.  Chest:  Breasts:    Right: No swelling, bleeding, inverted nipple, mass, nipple discharge or skin change.     Left: Swelling present. No bleeding, inverted nipple, mass, nipple discharge or skin change.  Abdominal:     General: Bowel sounds are normal. There is no distension.     Palpations: Abdomen is soft. There is no mass.     Tenderness: There is no abdominal tenderness.  Musculoskeletal:        General: No tenderness.     Cervical back: Normal range of motion and neck supple.     Right lower leg: No edema.     Left lower leg: No edema.  Lymphadenopathy:     Cervical: No cervical adenopathy.     Right cervical: No superficial, deep or posterior cervical adenopathy.    Left cervical: No superficial, deep or posterior cervical adenopathy.     Upper Body:     Right upper body: No supraclavicular or axillary adenopathy.     Left upper body: No supraclavicular or  axillary adenopathy.     Lower Body: No right inguinal adenopathy. No left inguinal adenopathy.  Skin:    Coloration: Skin is not jaundiced.     Findings: No lesion or rash.  Neurological:     General: No focal deficit present.     Mental Status: She is alert and oriented to person, place, and time. Mental status is at baseline.  Psychiatric:        Mood and Affect: Mood normal.        Behavior: Behavior normal.        Thought Content: Thought content normal.        Judgment: Judgment normal.   ASSESSMENT & PLAN:  Assessment:  An 88 y.o. female with a history of hormone positive DCIS, who now carries the diagnosis of stage IIA(T2 N0 M0) hormone  positive breast cancer, status post a left breast lumpectomy in May 2023.  In clinic today, I went over her surgical pathology with her.  Under normal circumstances, I would consider sending her tissue for Oncotype testing to determine if there is a benefit for adjuvant chemotherapy.  As the patient is 88 years old and wishes to maintain her quality of life as best as possible, this test will not be sent.  She has agreed to start taking anastrozole at 1 mg daily over the next 5 years to prevent additional invasive breast cancer from developing in either breast.  I strongly encouraged the patient that it will be important for her to remain compliant with this agent due to the high-grade nature of her breast cancer that was recently removed.  As mentioned previously, the patient stopped taking tamoxifen for DCIS management after developing relatively mild side effects.  This cannot be her mindset as she treats an invasive ductal carcinoma that clearly has a more virulent pathology in comparison to her previous DCIS.  Moving forward, I will follow this patient with clinical breast exams every 4 months.  She will also continue her annual mammograms for her routine radiographic breast cancer surveillance.  I will see this patient back in October 2023 for a repeat clinical breast exam. The patient understands all the plans discussed today and is in agreement with them.  Jawan Chavarria Macarthur Critchley, MD

## 2021-06-18 NOTE — Telephone Encounter (Signed)
Per 06/18/21 los next appt scheduled and confirmed with patient 

## 2021-06-18 NOTE — Progress Notes (Signed)
Face to face visit with pt in [place]. Pt completed her lumpectomy on 05/28/21 and reports that she is doing well. Pt does admit to some soreness where the lymph nodes were removed. This is pt's second breast cancer and is here to follow up with Dr. Bobby Rumpf who treated her first breast cancer.

## 2021-06-26 ENCOUNTER — Encounter: Payer: Self-pay | Admitting: Cardiology

## 2021-06-26 ENCOUNTER — Ambulatory Visit (INDEPENDENT_AMBULATORY_CARE_PROVIDER_SITE_OTHER): Payer: PPO | Admitting: Cardiology

## 2021-06-26 VITALS — BP 192/76 | HR 67 | Ht 66.5 in | Wt 166.6 lb

## 2021-06-26 DIAGNOSIS — I480 Paroxysmal atrial fibrillation: Secondary | ICD-10-CM

## 2021-06-26 DIAGNOSIS — C50919 Malignant neoplasm of unspecified site of unspecified female breast: Secondary | ICD-10-CM

## 2021-06-26 DIAGNOSIS — K922 Gastrointestinal hemorrhage, unspecified: Secondary | ICD-10-CM

## 2021-06-26 DIAGNOSIS — I5042 Chronic combined systolic (congestive) and diastolic (congestive) heart failure: Secondary | ICD-10-CM | POA: Diagnosis not present

## 2021-06-26 NOTE — Patient Instructions (Signed)
Medication Instructions:  Your physician recommends that you continue on your current medications as directed. Please refer to the Current Medication list given to you today. *If you need a refill on your cardiac medications before your next appointment, please call your pharmacy*  Lab Work: None. If you have labs (blood work) drawn today and your tests are completely normal, you will receive your results only by: [place] (if you have MyChart) OR A paper copy in the mail If you have any lab test that is abnormal or we need to change your treatment, we will call you to review the results.  Testing/Procedures: None.  Follow-Up: At [facility], you and your health needs are our priority.  As part of our continuing mission to provide you with exceptional heart care, we have created designated Provider Care Teams.  These Care Teams include your primary Cardiologist (physician) and Advanced Practice Providers (APPs -  Physician Assistants and Nurse Practitioners) who all work together to provide you with the care you need, when you need it.  Your physician wants you to follow-up in: As needed with Lars Mage, MD   We recommend signing up for the patient portal called "MyChart".  Sign up information is provided on this After Visit Summary.  MyChart is used to connect with patients for Virtual Visits (Telemedicine).  Patients are able to view lab/test results, encounter notes, upcoming appointments, etc.  Non-urgent messages can be sent to your provider as well.   To learn more about what you can do with MyChart, go to [email or web address].    Any Other Special Instructions Will Be Listed Below (If Applicable).

## 2021-06-26 NOTE — Progress Notes (Signed)
Electrophysiology Office Follow up Visit Note:    Date:  06/26/2021   ID:  Marilyn Robinson, DOB 05/12/1933, MRN [id number]  PCP:  Myrlene Broker, MD  [facility] Cardiologist:  Jenne Campus, MD  [place] Electrophysiologist:  Vickie Epley, MD    Interval History:    Marilyn Robinson is a 88 y.o. female who presents for a follow up visit. They were last seen in clinic 03/18/2021.  On 06/11/2021 she reported being diagnosed with recurring breast cancer, and has been followed by Dr. Carlis Abbott for atherosclerotic ulcer found on her pre-[place] CT. She wishes to proceed with LAAO; implant date is held for 07/17/2021.  Since their last appointment, they most recently followed up with Dr. Bettina Gavia on 06/17/2021. There was no clinical recurrence of Afib. She was continued on 2.5 mg Eliquis due to her history of GI bleed.  Today, she is accompanied by a family member. Overall, she is feeling pretty good all things considered.   She is now on hormonal therapy for her breast cancer, which she states appears to be more aggressive than before.  She denies any palpitations, chest pain, shortness of breath, or peripheral edema. No lightheadedness, headaches, syncope, orthopnea, or PND.      Past Medical History:  Diagnosis Date   Anemia 07/02/2016   Bilateral carotid artery stenosis 09/22/2015   BMI 31.0-31.9,adult 05/06/2015   Breast cancer (HCC)    Chronic combined systolic and diastolic heart failure ([place]) 07/17/2014   Overview:  EF 56% 09/23/10   Diabetes mellitus without complication (HCC)    Dislocation of right knee with lateral meniscus tear 03/15/2014   Ductal carcinoma in situ (DCIS) of left breast 05/06/2015   GERD (gastroesophageal reflux disease)    Hyperlipidemia 07/17/2014   Hypertension    Hypertensive heart disease with heart failure ([place]) 07/17/2014   Hypertensive urgency 09/22/2015   Hypokalemia 09/22/2015   Hyponatremia 05/29/2015   Mild CAD 07/17/2014   Overview:  50% RCA  stenosis in 2005   Non morbid obesity 05/06/2015   Pyuria 09/22/2015   Right knee pain 05/17/2014   S/P arthroscopy of knee 05/17/2014   Stroke-like episode 05/29/2015   Stroke-like symptoms 05/29/2015   TIA (transient ischemic attack)    Uterine prolapse 07/02/2016    Past Surgical History:  Procedure Laterality Date   BREAST BIOPSY     CATARACT EXTRACTION     CHOLECYSTECTOMY     CORONARY ANGIOPLASTY WITH STENT PLACEMENT     EYE SURGERY     TUBAL LIGATION      Current Medications: Current Meds  Medication Sig   ACCU-CHEK GUIDE test strip 1 each by Other route as needed for other (GLucose check).   acetaminophen (TYLENOL) 500 MG tablet Take 500 mg by mouth every 6 (six) hours as needed for mild pain.   anastrozole (ARIMIDEX) 1 MG tablet Take 1 tablet (1 mg total) by mouth daily.   carvedilol (COREG) 25 MG tablet Take 25 mg by mouth 2 (two) times daily.    cloNIDine (CATAPRES) 0.1 MG tablet Take 1 tablet (0.1 mg total) by mouth daily as needed (Take if your blood pressure is >190 on top).   cyanocobalamin (,VITAMIN B-12,) 1000 MCG/ML injection Inject 1 mL into the muscle every 30 (thirty) days.   ELIQUIS 2.5 MG TABS tablet Take 2.5 mg by mouth 2 (two) times daily.   furosemide (LASIX) 20 MG tablet Take 20 mg by mouth daily as needed for edema.   hydrALAZINE (  APRESOLINE) 25 MG tablet Take 25 mg by mouth 3 (three) times daily.   Meclizine HCl 25 MG CHEW Chew 1 tablet by mouth 3 (three) times daily as needed (dizziness).   metFORMIN (GLUCOPHAGE-XR) 500 MG 24 hr tablet Take 500 mg by mouth See admin instructions. 500 mg in the morning and 1,000 at night   mirtazapine (REMERON) 15 MG tablet Take 15 mg by mouth daily.    olmesartan (BENICAR) 40 MG tablet Take 1 tablet by mouth daily.   pantoprazole (PROTONIX) 40 MG tablet Take 40 mg by mouth daily.   rosuvastatin (CRESTOR) 10 MG tablet Take 10 mg by mouth every other day.     Allergies:   Antihistamines, diphenhydramine-type; Lipitor  [atorvastatin]; Penicillins; Allopurinol; Aspirin; and Fenofibrate   Social History   Socioeconomic History   Marital status: Married    Spouse name: Not on file   Number of children: Not on file   Years of education: Not on file   Highest education level: Not on file  Occupational History   Not on file  Tobacco Use   Smoking status: Former    Passive exposure: Past   Smokeless tobacco: Never  Vaping Use   Vaping Use: Never used  Substance and Sexual Activity   Alcohol use: No   Drug use: No   Sexual activity: Not on file  Other Topics Concern   Not on file  Social History Narrative   Not on file   Social Determinants of Health   Financial Resource Strain: Not on file  Food Insecurity: Not on file  Transportation Needs: Not on file  Physical Activity: Not on file  Stress: Not on file  Social Connections: Not on file     Family History: The patient's family history includes CAD in her father and mother; Diabetes in her father and mother; Stroke in her mother.  ROS:   Please see the history of present illness.    All other systems reviewed and are negative.  EKGs/Labs/Other Studies Reviewed:    The following studies were reviewed today:  Echo 01/21/2021: Conclusions: The left ventricular size is normal. Left ventricular wall thickness is normal. There is normal global left ventricular contractility. The left atrium is mildly dilated. No evidence of interatrial communication by color flow doppler analysis and bubble study. Interatrial and interventricular septum intact.   Echo TTE 10/19/2020 ([place]): The left ventricular size is normal. There is mild concentric left  ventricular hypertrophy. The left ventricular wall motion is normal.  The left ventricle is hyperdynamic. LV ejection fraction = >70%.  Mild diastolic dysfunction [Grade IB] with likely increased left  atrial pressure. E/e' '10-19'. E/e' < 8 is normal, > 15 is suggestive  of  increased PCWP.  The right ventricle is normal in size and function.  The atria are mildly dilated.  There is no significant valvular stenosis or regurgitation.  There was insufficient TR detected to calculate RV systolic pressure.  IVC size was normal.   There is no significant change in comparison with the last study.    Monitor 10/2019: ZIO monitor was performed for 7 days 5 hours beginning 09/15/2019 to assess arrhythmia associated with TIA.   Cardiac rhythm throughout was sinus with average, minimum and maximum heart rates of 66, 51 and 98 bpm.   There were no pauses of 3 seconds or greater and no episodes of sinus node exit block or second or third-degree AV node block.   There were no triggered  or diary events.   Ventricular ectopy was rare with isolated PVCs and couplets.   Supraventricular ectopy was rare and there were no episodes of atrial fibrillation or flutter. There were 20 brief runs of atrial premature contractions the longest 17 complexes at a slow rate of 112 bpm, brief atrial tachycardia.   Conclusion, rare ventricular and supraventricular ectopy and no episodes of atrial fibrillation or flutter.  EKG:  EKG is personally reviewed.  06/26/2021: EKG was not ordered. 03/18/2021:  Sinus rhythm. PVC.  Recent Labs: 03/18/2021: BUN 22; Potassium 4.8; Sodium 131 06/06/2021: Creatinine, Ser 1.20 06/17/2021: Hemoglobin 10.6; Platelets 316   Recent Lipid Panel    Component Value Date/Time   CHOL 119 08/19/2016 1042   TRIG 107 08/19/2016 1042   HDL 41 08/19/2016 1042   CHOLHDL 3.3 05/29/2015 1549   VLDL 22 05/29/2015 1549   LDLCALC 57 08/19/2016 1042    Physical Exam:    VS:  BP (!) 192/76   Pulse 67   Ht 5' 6.5" (1.689 m)   Wt 166 lb 9.6 oz (75.6 kg)   SpO2 97%   BMI 26.49 kg/m     Wt Readings from Last 3 Encounters:  06/26/21 166 lb 9.6 oz (75.6 kg)  06/18/21 167 lb 3.2 oz (75.8 kg)  06/17/21 168 lb 12.8 oz (76.6 kg)     GEN: Well nourished, well  developed in no acute distress HEENT: Normal NECK: No JVD; No carotid bruits LYMPHATICS: No lymphadenopathy CARDIAC: RRR, no murmurs, rubs, gallops RESPIRATORY:  Clear to auscultation without rales, wheezing or rhonchi  ABDOMEN: Soft, non-tender, non-distended MUSCULOSKELETAL:  No edema; No deformity  SKIN: Warm and dry NEUROLOGIC:  Alert and oriented x 3 PSYCHIATRIC:  Normal affect        ASSESSMENT:    1. Paroxysmal atrial fibrillation (HCC)   2. Chronic combined systolic and diastolic heart failure ([place])   3. Gastrointestinal hemorrhage, unspecified gastrointestinal hemorrhage type   4. Malignant neoplasm of female breast, unspecified estrogen receptor status, unspecified laterality, unspecified site of breast ([place])    PLAN:    In order of problems listed above:  #Atrial fibrillation Currently on Eliquis for stroke prophylaxis.  I previously met with the patient to discuss left atrial appendage occlusion as a mechanism to avoid long-term exposure anticoagulation given history of GI bleeding.  Since I last saw the patient she has been diagnosed with locally invasive breast cancer.  She is started oral medications for this.  Given the new diagnosis of a locally invasive/aggressive breast cancer, I do not think she is a suitable candidate for left atrial appendage occlusion.  I do long discussion with the patient and her daughter today about this.  Also discussed the case with her primary cardiologist, Dr. Bettina Gavia who is in agreement.  For now, Dr. Bettina Gavia myself recommend continuing Eliquis for stroke prophylaxis.  She will continue to follow-up with Dr. Bettina Gavia.  #Chronic combined systolic and diastolic heart failure NYHA class II-III.  Warm and relatively euvolemic on exam today.  Continue current medical therapy.  Close follow-up with Dr. Bettina Gavia.  #GI bleeding history Continue Eliquis for now.    Follow-up on an as-needed basis  Medication Adjustments/Labs and Tests  Ordered: Current medicines are reviewed at length with the patient today.  Concerns regarding medicines are outlined above.  No orders of the defined types were placed in this encounter.  No orders of the defined types were placed in this encounter.   I,Mathew Stumpf,acting as a Education administrator  for Vickie Epley, MD.,have documented all relevant documentation on the behalf of Vickie Epley, MD,as directed by  Vickie Epley, MD while in the presence of Vickie Epley, MD.  I, Vickie Epley, MD, have reviewed all documentation for this visit. The documentation on 06/26/21 for the exam, diagnosis, procedures, and orders are all accurate and complete.   Signed, Lars Mage, MD, [facility], [facility] 06/26/2021 10:51 PM    Electrophysiology [facility]

## 2021-07-03 ENCOUNTER — Ambulatory Visit (INDEPENDENT_AMBULATORY_CARE_PROVIDER_SITE_OTHER): Payer: PPO | Admitting: Cardiology

## 2021-07-03 ENCOUNTER — Encounter: Payer: Self-pay | Admitting: Cardiology

## 2021-07-03 VITALS — BP 180/80 | HR 60 | Ht 66.5 in | Wt 166.0 lb

## 2021-07-03 DIAGNOSIS — I110 Hypertensive heart disease with heart failure: Secondary | ICD-10-CM

## 2021-07-03 DIAGNOSIS — Z7901 Long term (current) use of anticoagulants: Secondary | ICD-10-CM

## 2021-07-03 DIAGNOSIS — I2510 Atherosclerotic heart disease of native coronary artery without angina pectoris: Secondary | ICD-10-CM

## 2021-07-03 DIAGNOSIS — I480 Paroxysmal atrial fibrillation: Secondary | ICD-10-CM | POA: Diagnosis not present

## 2021-07-03 NOTE — Patient Instructions (Addendum)
Medication Instructions:  Your physician recommends that you continue on your current medications as directed. Please refer to the Current Medication list given to you today.  *If you need a refill on your cardiac medications before your next appointment, please call your pharmacy*   Lab Work: NONE If you have labs (blood work) drawn today and your tests are completely normal, you will receive your results only by: [place] (if you have MyChart) OR A paper copy in the mail If you have any lab test that is abnormal or we need to change your treatment, we will call you to review the results.   Testing/Procedures: NONE   Follow-Up: At [facility], you and your health needs are our priority.  As part of our continuing mission to provide you with exceptional heart care, we have created designated Provider Care Teams.  These Care Teams include your primary Cardiologist (physician) and Advanced Practice Providers (APPs -  Physician Assistants and Nurse Practitioners) who all work together to provide you with the care you need, when you need it.  We recommend signing up for the patient portal called "MyChart".  Sign up information is provided on this After Visit Summary.  MyChart is used to connect with patients for Virtual Visits (Telemedicine).  Patients are able to view lab/test results, encounter notes, upcoming appointments, etc.  Non-urgent messages can be sent to your provider as well.   To learn more about what you can do with MyChart, go to [email or web address].    Your next appointment:  Keep Appt for September 15th at 10:40               Other Instructions   Important Information About Sugar

## 2021-07-03 NOTE — Progress Notes (Signed)
Cardiology Office Note:    Date:  07/03/2021   ID:  Marilyn Robinson, DOB Nov 04, 1933, MRN [id number]  PCP:  Myrlene Broker, MD  Cardiologist:  Shirlee More, MD    Referring MD: Myrlene Broker, MD    ASSESSMENT:    1. Chronic anticoagulation   2. Paroxysmal atrial fibrillation (HCC)   3. Hypertensive heart disease with heart failure ([place])   4. Mild CAD    PLAN:    In order of problems listed above:  Patient perspective her greatest concern is the deleterious effect on the quality of her life if anticoagulation is interrupted by again having recurrent GI bleeding and she has another stroke.  She very much wants to pursue Watchman device and asked me to refer her to Dr. Wylene [facility] for consideration.  She understands this is a difficult decision.  For now we will continue her current anticoagulation reduced dose Eliquis her multidrug regimen regimen for resistant hypertension including carvedilol clonidine furosemide hydralazine and Benicar as well as her statin with CAD and stroke. I do not think that her desire to have a Watchman device is unreasonable   Next appointment: She is already scheduled to see me in September   Medication Adjustments/Labs and Tests Ordered: Current medicines are reviewed at length with the patient today.  Concerns regarding medicines are outlined above.  Orders Placed This Encounter  Procedures   CBC with Differential/Platelet   No orders of the defined types were placed in this encounter.   Chief Complaint  Patient presents with   Follow-up  She is very concerned that she was declined for Watchman  History of Present Illness:    Marilyn Robinson is a 88 y.o. female with a hx of an array of complex cardiac problems including coronary artery disease hypertensive heart disease with heart failure atrial fibrillation stroke recurrent GI bleeding fortunately tolerating low-dose anticoagulant and was pending Watchman device at [facility] last seen by me 06/17/2021.  Her course was complicated by a CT scan showing a penetrating aortic ulcer however her course is stable she did not have pain or acute aortopathy and was seen by vascular surgery not required any intervention.  She also developed breast cancer and had surgery.  She is followed by oncology: Health was seen 06/18/2021 with left lumpectomy May 2023 and was treated with anastrozole would not advise further treatment at this time   Compliance with diet, lifestyle and medications: Yes   Fortunately she tolerates reduced dose anticoagulant without recurrent clinical bleeding and a stable CBC She is very committed to living independently and her greatest concern is life quality and the deleterious effect of another stroke therefore anticoagulation was interrupted She very much wants to have Watchman device and asked me to refer her to Dr. Denman George [facility] for opinion She has been seen at [facility] and decision was to defer Watchman device at this time with multiple complex medical problems complicating her care She follows blood pressure at home and has remained in the range of 427 and 062 systolic clinical goal Past Medical History:  Diagnosis Date   Anemia 07/02/2016   Bilateral carotid artery stenosis 09/22/2015   BMI 31.0-31.9,adult 05/06/2015   Breast cancer ([place])    Chronic combined systolic and diastolic heart failure ([place]) 07/17/2014   Overview:  EF 56% 09/23/10   Diabetes mellitus without complication (HCC)    Dislocation of right knee with lateral meniscus tear 03/15/2014  Ductal carcinoma in situ (DCIS) of left breast 05/06/2015   GERD (gastroesophageal reflux disease)    Hyperlipidemia 07/17/2014   Hypertension    Hypertensive heart disease with heart failure ([place]) 07/17/2014   Hypertensive urgency 09/22/2015   Hypokalemia 09/22/2015   Hyponatremia 05/29/2015   Mild CAD 07/17/2014   Overview:  50% RCA stenosis in 2005   Non morbid obesity  05/06/2015   Pyuria 09/22/2015   Right knee pain 05/17/2014   S/P arthroscopy of knee 05/17/2014   Stroke-like episode 05/29/2015   Stroke-like symptoms 05/29/2015   TIA (transient ischemic attack)    Uterine prolapse 07/02/2016    Past Surgical History:  Procedure Laterality Date   BREAST BIOPSY     CATARACT EXTRACTION     CHOLECYSTECTOMY     CORONARY ANGIOPLASTY WITH STENT PLACEMENT     EYE SURGERY     TUBAL LIGATION      Current Medications: Current Meds  Medication Sig   acetaminophen (TYLENOL) 500 MG tablet Take 500 mg by mouth every 6 (six) hours as needed for mild pain.   anastrozole (ARIMIDEX) 1 MG tablet Take 1 tablet (1 mg total) by mouth daily.   carvedilol (COREG) 25 MG tablet Take 25 mg by mouth 2 (two) times daily.    cloNIDine (CATAPRES) 0.1 MG tablet Take 1 tablet (0.1 mg total) by mouth daily as needed (Take if your blood pressure is >190 on top).   cyanocobalamin (,VITAMIN B-12,) 1000 MCG/ML injection Inject 1 mL into the muscle every 30 (thirty) days.   ELIQUIS 2.5 MG TABS tablet Take 2.5 mg by mouth 2 (two) times daily.   furosemide (LASIX) 20 MG tablet Take 20 mg by mouth daily as needed for edema.   hydrALAZINE (APRESOLINE) 25 MG tablet Take 25 mg by mouth 3 (three) times daily.   Meclizine HCl 25 MG CHEW Chew 1 tablet by mouth 3 (three) times daily as needed (dizziness).   metFORMIN (GLUCOPHAGE-XR) 500 MG 24 hr tablet Take 500 mg by mouth See admin instructions. 500 mg in the morning and 1,000 at night   mirtazapine (REMERON) 15 MG tablet Take 15 mg by mouth daily.    olmesartan (BENICAR) 40 MG tablet Take 1 tablet by mouth daily.   pantoprazole (PROTONIX) 40 MG tablet Take 40 mg by mouth daily.   rosuvastatin (CRESTOR) 10 MG tablet Take 10 mg by mouth every other day.     Allergies:   Antihistamines, diphenhydramine-type; Lipitor [atorvastatin]; Penicillins; Allopurinol; Aspirin; and Fenofibrate   Social History   Socioeconomic History   Marital status:  Married    Spouse name: Not on file   Number of children: Not on file   Years of education: Not on file   Highest education level: Not on file  Occupational History   Not on file  Tobacco Use   Smoking status: Former    Passive exposure: Past   Smokeless tobacco: Never  Vaping Use   Vaping Use: Never used  Substance and Sexual Activity   Alcohol use: No   Drug use: No   Sexual activity: Not on file  Other Topics Concern   Not on file  Social History Narrative   Not on file   Social Determinants of Health   Financial Resource Strain: Not on file  Food Insecurity: Not on file  Transportation Needs: Not on file  Physical Activity: Not on file  Stress: Not on file  Social Connections: Not on file     Family History:  The patient's family history includes CAD in her father and mother; Diabetes in her father and mother; Stroke in her mother. ROS:   Please see the history of present illness.    All other systems reviewed and are negative.  EKGs/Labs/Other Studies Reviewed:    The following studies were reviewed today:    Recent Labs: 03/18/2021: BUN 22; Potassium 4.8; Sodium 131 06/06/2021: Creatinine, Ser 1.20 06/17/2021: Hemoglobin 10.6; Platelets 316  Recent Lipid Panel    Component Value Date/Time   CHOL 119 08/19/2016 1042   TRIG 107 08/19/2016 1042   HDL 41 08/19/2016 1042   CHOLHDL 3.3 05/29/2015 1549   VLDL 22 05/29/2015 1549   LDLCALC 57 08/19/2016 1042    Physical Exam:    VS:  BP (!) 180/80 (BP Location: Left Arm, Patient Position: Sitting, Cuff Size: Normal)   Pulse 60   Ht 5' 6.5" (1.689 m)   Wt 166 lb (75.3 kg)   SpO2 97%   BMI 26.39 kg/m     Wt Readings from Last 3 Encounters:  07/03/21 166 lb (75.3 kg)  06/26/21 166 lb 9.6 oz (75.6 kg)  06/18/21 167 lb 3.2 oz (75.8 kg)     GEN: She looks younger than her age well nourished, well developed in no acute distress HEENT: Normal NECK: No JVD; No carotid bruits LYMPHATICS: No  lymphadenopathy CARDIAC: RRR, no murmurs, rubs, gallops RESPIRATORY:  Clear to auscultation without rales, wheezing or rhonchi  ABDOMEN: Soft, non-tender, non-distended MUSCULOSKELETAL:  No edema; No deformity  SKIN: Warm and dry NEUROLOGIC:  Alert and oriented x 3 PSYCHIATRIC:  Normal affect    Signed, Shirlee More, MD  07/03/2021 5:04 PM    [facility]

## 2021-07-04 LAB — CBC WITH DIFFERENTIAL/PLATELET
Basophils Absolute: 0.1 10*3/uL (ref 0.0–0.2)
Basos: 1 %
EOS (ABSOLUTE): 0.4 10*3/uL (ref 0.0–0.4)
Eos: 4 %
Hematocrit: 33.7 % — ABNORMAL LOW (ref 34.0–46.6)
Hemoglobin: 10.9 g/dL — ABNORMAL LOW (ref 11.1–15.9)
Immature Grans (Abs): 0 10*3/uL (ref 0.0–0.1)
Immature Granulocytes: 0 %
Lymphocytes Absolute: 1.4 10*3/uL (ref 0.7–3.1)
Lymphs: 16 %
MCH: 29.2 pg (ref 26.6–33.0)
MCHC: 32.3 g/dL (ref 31.5–35.7)
MCV: 90 fL (ref 79–97)
Monocytes Absolute: 0.8 10*3/uL (ref 0.1–0.9)
Monocytes: 10 %
Neutrophils Absolute: 6 10*3/uL (ref 1.4–7.0)
Neutrophils: 69 %
Platelets: 285 10*3/uL (ref 150–450)
RBC: 3.73 x10E6/uL — ABNORMAL LOW (ref 3.77–5.28)
RDW: 13.1 % (ref 11.7–15.4)
WBC: 8.7 10*3/uL (ref 3.4–10.8)

## 2021-07-17 ENCOUNTER — Other Ambulatory Visit: Payer: Self-pay

## 2021-07-17 ENCOUNTER — Other Ambulatory Visit: Payer: Self-pay | Admitting: *Deleted

## 2021-07-17 DIAGNOSIS — Z7901 Long term (current) use of anticoagulants: Secondary | ICD-10-CM

## 2021-07-18 LAB — CBC
Hematocrit: 30.6 % — ABNORMAL LOW (ref 34.0–46.6)
Hemoglobin: 9.7 g/dL — ABNORMAL LOW (ref 11.1–15.9)
MCH: 28.5 pg (ref 26.6–33.0)
MCHC: 31.7 g/dL (ref 31.5–35.7)
MCV: 90 fL (ref 79–97)
Platelets: 280 10*3/uL (ref 150–450)
RBC: 3.4 x10E6/uL — ABNORMAL LOW (ref 3.77–5.28)
RDW: 13.2 % (ref 11.7–15.4)
WBC: 8.6 10*3/uL (ref 3.4–10.8)

## 2021-07-21 ENCOUNTER — Other Ambulatory Visit: Payer: Self-pay

## 2021-07-21 DIAGNOSIS — K922 Gastrointestinal hemorrhage, unspecified: Secondary | ICD-10-CM

## 2021-07-24 ENCOUNTER — Other Ambulatory Visit: Payer: Self-pay

## 2021-07-24 DIAGNOSIS — Z7901 Long term (current) use of anticoagulants: Secondary | ICD-10-CM

## 2021-07-24 LAB — CBC
Hematocrit: 30.6 % — ABNORMAL LOW (ref 34.0–46.6)
Hemoglobin: 9.8 g/dL — ABNORMAL LOW (ref 11.1–15.9)
MCH: 29.2 pg (ref 26.6–33.0)
MCHC: 32 g/dL (ref 31.5–35.7)
MCV: 91 fL (ref 79–97)
Platelets: 266 10*3/uL (ref 150–450)
RBC: 3.36 x10E6/uL — ABNORMAL LOW (ref 3.77–5.28)
RDW: 13.1 % (ref 11.7–15.4)
WBC: 6.9 10*3/uL (ref 3.4–10.8)

## 2021-07-28 ENCOUNTER — Telehealth: Payer: Self-pay | Admitting: Cardiology

## 2021-07-28 NOTE — Telephone Encounter (Signed)
Caller stated she is following-up on a document faxed 3 times to Dr. Joya Gaskins attention for joint insurance approval for surgery to be done on Wed. 7/19.

## 2021-08-29 ENCOUNTER — Other Ambulatory Visit: Payer: Self-pay

## 2021-08-29 DIAGNOSIS — E782 Mixed hyperlipidemia: Secondary | ICD-10-CM

## 2021-08-29 DIAGNOSIS — I110 Hypertensive heart disease with heart failure: Secondary | ICD-10-CM

## 2021-08-29 DIAGNOSIS — E871 Hypo-osmolality and hyponatremia: Secondary | ICD-10-CM

## 2021-08-29 NOTE — Telephone Encounter (Signed)
Surgery has been done.

## 2021-08-30 LAB — CBC
Hematocrit: 32.2 % — ABNORMAL LOW (ref 34.0–46.6)
Hemoglobin: 10.1 g/dL — ABNORMAL LOW (ref 11.1–15.9)
MCH: 28.5 pg (ref 26.6–33.0)
MCHC: 31.4 g/dL — ABNORMAL LOW (ref 31.5–35.7)
MCV: 91 fL (ref 79–97)
Platelets: 341 10*3/uL (ref 150–450)
RBC: 3.54 x10E6/uL — ABNORMAL LOW (ref 3.77–5.28)
RDW: 13.3 % (ref 11.7–15.4)
WBC: 8.5 10*3/uL (ref 3.4–10.8)

## 2021-08-30 LAB — BASIC METABOLIC PANEL
BUN/Creatinine Ratio: 25 (ref 12–28)
BUN: 30 mg/dL — ABNORMAL HIGH (ref 8–27)
CO2: 18 mmol/L — ABNORMAL LOW (ref 20–29)
Calcium: 9.7 mg/dL (ref 8.7–10.3)
Chloride: 101 mmol/L (ref 96–106)
Creatinine, Ser: 1.21 mg/dL — ABNORMAL HIGH (ref 0.57–1.00)
Glucose: 193 mg/dL — ABNORMAL HIGH (ref 70–99)
Potassium: 5.2 mmol/L (ref 3.5–5.2)
Sodium: 136 mmol/L (ref 134–144)
eGFR: 44 mL/min/{1.73_m2} — ABNORMAL LOW (ref >59)

## 2021-09-01 ENCOUNTER — Ambulatory Visit: Payer: PPO | Admitting: Cardiology

## 2021-09-03 ENCOUNTER — Telehealth: Payer: Self-pay

## 2021-09-03 NOTE — Telephone Encounter (Signed)
Results reviewed with pt as per Dr. Munley's note.  Pt verbalized understanding and had no additional questions. Routed to PCP  

## 2021-09-12 ENCOUNTER — Other Ambulatory Visit: Payer: Self-pay

## 2021-09-12 DIAGNOSIS — E782 Mixed hyperlipidemia: Secondary | ICD-10-CM

## 2021-09-13 LAB — CBC
Hematocrit: 31.6 % — ABNORMAL LOW (ref 34.0–46.6)
Hemoglobin: 10.2 g/dL — ABNORMAL LOW (ref 11.1–15.9)
MCH: 28.9 pg (ref 26.6–33.0)
MCHC: 32.3 g/dL (ref 31.5–35.7)
MCV: 90 fL (ref 79–97)
Platelets: 248 10*3/uL (ref 150–450)
RBC: 3.53 x10E6/uL — ABNORMAL LOW (ref 3.77–5.28)
RDW: 13.4 % (ref 11.7–15.4)
WBC: 9 10*3/uL (ref 3.4–10.8)

## 2021-09-25 ENCOUNTER — Other Ambulatory Visit: Payer: Self-pay

## 2021-09-25 NOTE — Progress Notes (Unsigned)
Cardiology Office Note:    Date:  09/26/2021   ID:  Marilyn Robinson, DOB 1933-04-17, MRN [id number]  PCP:  Myrlene Broker, MD  Cardiologist:  Shirlee More, MD    Referring MD: Myrlene Broker, MD    ASSESSMENT:    1. Paroxysmal atrial fibrillation (HCC)   2. Chronic anticoagulation   3. Presence of Watchman left atrial appendage closure device   4. Hypertensive heart disease with heart failure ([place])   5. Mixed dyslipidemia    PLAN:    In order of problems listed above:  Stable maintaining sinus rhythm anticoagulated full dose anticoagulant pending follow-up post Watchman device if she remains anticoagulated she would drop to half dose and would need to have continued at least monthly CBCs with her previous severe anemia and GI bleeding.  If given a choice I prefer low-dose Eliquis to clopidogrel. She will follow-up [facility] Dr. Denman George 'Stable e hypertension she will continue her current medical regimen including central active clonidine loop diuretic carvedilol and hydralazine ARB Continue her lipid-lowering therapy rosuvastatin   Next appointment: 3 months   Medication Adjustments/Labs and Tests Ordered: Current medicines are reviewed at length with the patient today.  Concerns regarding medicines are outlined above.  No orders of the defined types were placed in this encounter.  No orders of the defined types were placed in this encounter.     History of Present Illness:    Marilyn Robinson is a 88 y.o. female with a hx of complex cardiology problems including CAD hypertensive heart disease with heart failure atrial fibrillation stroke and recurrent GI bleeding perforating aortic ulcer on pre-Watchman CT scan and breast cancer with surgery lumpectomy last seen 07/03/2021 pending Watchman device.  She had successful Watchman device performed 07/30/2021 [facility] Dr. Denman George.  Compliance with diet, lifestyle and medications: Yes  Overall is doing well  after Watchman device despite taking full dose anticoagulant she has had no clinical bleeding is due for CBC today She has arrangements for follow-up [facility] will likely have a repeat TEE or CTA to assess the Watchman device and thrombus IF she stays on long-term anticoagulation I think she should go back to reduced dose Eliquis Unfortunately she is just a little bit more weak and unsteady since all this and request to be referred to physical therapy for conditioning and gait training She does not check her blood pressure at home a repeat blood pressure determination is markedly improved she has a long history of labile hypertension previously had severe orthostatic hypotension and in my opinion has had a good clinical point and her blood pressure treatment Has had no recurrent neurologic events no palpitations syncope chest pain edema or shortness of breath Past Medical History:  Diagnosis Date   Acute lacunar infarction ([place]) 10/19/2016   Anemia 07/02/2016   Benign paroxysmal vertigo 01/07/2017   Formatting of this note might be different from the original. patient still with fluid behind the ears that may be contributing to positional changes inducing vertigo. finished last dose of antibiotics today. will add nasal sprayto see if draining inner ears will improve sinus symptoms   Bilateral carotid artery stenosis 09/22/2015   BMI 31.0-31.9,adult 05/06/2015   Breast cancer ([place])    Calculus of gallbladder and bile duct without cholecystitis 01/07/2017   Candidiasis of vulva 05/22/2021   Chronic combined systolic and diastolic heart failure ([place]) 07/17/2014   Overview:  EF 56% 09/23/10   Closed fracture of lumbar  vertebra ([place]) 07/10/2008   Formatting of this note might be different from the original. Thought patient's knee pain may be referred from hip. xray of hip shows some arthritic changes, but incidentally, review of lumbar spine shows turning of the bones. believe patient's hip and knee  pain may be referred from compression of nerve in back. will request mri, and orthopedic referral   Complete rupture of rotator cuff 01/07/2017   Formatting of this note might be different from the original. patient already has her appointment scheduled for ortho for follow up   DDD (degenerative disc disease), cervical 01/07/2017   Formatting of this note might be different from the original. w/ Pain in R. Arm due to foraminal impingement   Diabetes mellitus with complication (HCC)    Diabetes mellitus without complication (HCC)    Dislocation of right knee with lateral meniscus tear 03/15/2014   Ductal carcinoma in situ (DCIS) of left breast 05/06/2015   GERD (gastroesophageal reflux disease)    Gouty arthropathy 01/07/2017   Formatting of this note might be different from the original. will check patient's uric acid level   Hyperlipidemia 07/17/2014   Hypertension    Hypertensive heart disease with heart failure ([place]) 07/17/2014   Hypertensive urgency 09/22/2015   Hypokalemia 09/22/2015   Hyponatremia 05/29/2015   Mild CAD 07/17/2014   Overview:  50% RCA stenosis in 2005   Non morbid obesity 05/06/2015   Occlusion and stenosis of carotid artery 01/07/2017   Other specified acquired hypothyroidism 01/07/2017   Paroxysmal atrial fibrillation ([place]) 05/22/2021   Pyuria 09/22/2015   Rhinosinusitis 10/14/2018   Right knee pain 05/17/2014   S/P arthroscopy of knee 05/17/2014   Secondary cardiomyopathy ([place]) 01/07/2017   Formatting of this note might be different from the original. EF repeat in 10/2006 was 69%   Stage 3a chronic kidney disease ([place]) 04/29/2020   Stroke ([place]) 02/19/2021   Stroke-like episode 05/29/2015   Stroke-like symptoms 05/29/2015   Thoracic aortic atherosclerosis ([place]) 05/06/2021   TIA (transient ischemic attack)    Uterine prolapse 07/02/2016    Past Surgical History:  Procedure Laterality Date   BREAST BIOPSY     CATARACT EXTRACTION     CHOLECYSTECTOMY     CORONARY ANGIOPLASTY  WITH STENT PLACEMENT     EYE SURGERY     TUBAL LIGATION      Current Medications: Current Meds  Medication Sig   acetaminophen (TYLENOL) 500 MG tablet Take 500 mg by mouth every 6 (six) hours as needed for mild pain.   anastrozole (ARIMIDEX) 1 MG tablet Take 1 tablet (1 mg total) by mouth daily.   carvedilol (COREG) 25 MG tablet Take 25 mg by mouth 2 (two) times daily.    cloNIDine (CATAPRES) 0.1 MG tablet Take 1 tablet (0.1 mg total) by mouth daily as needed (Take if your blood pressure is >190 on top).   cyanocobalamin (,VITAMIN B-12,) 1000 MCG/ML injection Inject 1 mL into the muscle every 30 (thirty) days.   ELIQUIS 5 MG TABS tablet Take 5 mg by mouth 2 (two) times daily.   furosemide (LASIX) 20 MG tablet Take 20 mg by mouth daily as needed for edema.   hydrALAZINE (APRESOLINE) 50 MG tablet Take 50 mg by mouth 3 (three) times daily.   Meclizine HCl 25 MG CHEW Chew 1 tablet by mouth 3 (three) times daily as needed (dizziness).   metFORMIN (GLUCOPHAGE-XR) 500 MG 24 hr tablet Take 500 mg by mouth See admin instructions. 500 mg in  the morning and 1,000 at night   mirtazapine (REMERON) 15 MG tablet Take 15 mg by mouth daily.    olmesartan (BENICAR) 40 MG tablet Take 1 tablet by mouth daily.   pantoprazole (PROTONIX) 40 MG tablet Take 40 mg by mouth daily.   rosuvastatin (CRESTOR) 10 MG tablet Take 10 mg by mouth every other day.     Allergies:   Antihistamines, diphenhydramine-type; Lipitor [atorvastatin]; Penicillins; Allopurinol; Aspirin; and Fenofibrate   Social History   Socioeconomic History   Marital status: Married    Spouse name: Not on file   Number of children: Not on file   Years of education: Not on file   Highest education level: Not on file  Occupational History   Not on file  Tobacco Use   Smoking status: Former    Passive exposure: Past   Smokeless tobacco: Never  Vaping Use   Vaping Use: Never used  Substance and Sexual Activity   Alcohol use: No   Drug  use: No   Sexual activity: Not on file  Other Topics Concern   Not on file  Social History Narrative   Not on file   Social Determinants of Health   Financial Resource Strain: Not on file  Food Insecurity: Not on file  Transportation Needs: Not on file  Physical Activity: Not on file  Stress: Not on file  Social Connections: Not on file     Family History: The patient's family history includes CAD in her father and mother; Diabetes in her father and mother; Stroke in her mother. ROS:   Please see the history of present illness.    All other systems reviewed and are negative.  EKGs/Labs/Other Studies Reviewed:    The following studies were reviewed today:  EKG 07/30/2021 [facility] showed sinus bradycardia otherwise normal EKG  Recent Labs: 08/29/2021: BUN 30; Creatinine, Ser 1.21; Potassium 5.2; Sodium 136 09/12/2021: Hemoglobin 10.2; Platelets 248  Recent Lipid Panel    Component Value Date/Time   CHOL 119 08/19/2016 1042   TRIG 107 08/19/2016 1042   HDL 41 08/19/2016 1042   CHOLHDL 3.3 05/29/2015 1549   VLDL 22 05/29/2015 1549   LDLCALC 57 08/19/2016 1042    Physical Exam:    VS:  BP (!) 180/96 (BP Location: Left Arm, Patient Position: Sitting, Cuff Size: Normal)   Pulse 90   Ht 5' 6.5" (1.689 m)   Wt 167 lb 12.8 oz (76.1 kg)   SpO2 97%   BMI 26.68 kg/m     Wt Readings from Last 3 Encounters:  09/26/21 167 lb 12.8 oz (76.1 kg)  07/03/21 166 lb (75.3 kg)  06/26/21 166 lb 9.6 oz (75.6 kg)     GEN: She looks frail well nourished, well developed in no acute distress HEENT: Normal NECK: No JVD; No carotid bruits LYMPHATICS: No lymphadenopathy CARDIAC: RRR, no murmurs, rubs, gallops RESPIRATORY:  Clear to auscultation without rales, wheezing or rhonchi  ABDOMEN: Soft, non-tender, non-distended MUSCULOSKELETAL:  No edema; No deformity  SKIN: Warm and dry NEUROLOGIC:  Alert and oriented x 3 PSYCHIATRIC:  Normal affect    Signed, Shirlee More, MD   09/26/2021 11:32 AM    [place]

## 2021-09-26 ENCOUNTER — Encounter: Payer: Self-pay | Admitting: Cardiology

## 2021-09-26 ENCOUNTER — Ambulatory Visit: Payer: PPO | Attending: Cardiology | Admitting: Cardiology

## 2021-09-26 VITALS — BP 152/60 | HR 90 | Ht 66.5 in | Wt 167.8 lb

## 2021-09-26 DIAGNOSIS — Z95818 Presence of other cardiac implants and grafts: Secondary | ICD-10-CM | POA: Diagnosis not present

## 2021-09-26 DIAGNOSIS — I480 Paroxysmal atrial fibrillation: Secondary | ICD-10-CM

## 2021-09-26 DIAGNOSIS — Z7901 Long term (current) use of anticoagulants: Secondary | ICD-10-CM

## 2021-09-26 DIAGNOSIS — E782 Mixed hyperlipidemia: Secondary | ICD-10-CM

## 2021-09-26 DIAGNOSIS — I110 Hypertensive heart disease with heart failure: Secondary | ICD-10-CM | POA: Diagnosis not present

## 2021-09-26 NOTE — Patient Instructions (Addendum)
Medication Instructions:  Your physician recommends that you continue on your current medications as directed. Please refer to the Current Medication list given to you today.  *If you need a refill on your cardiac medications before your next appointment, please call your pharmacy*   Lab Work: None If you have labs (blood work) drawn today and your tests are completely normal, you will receive your results only by: [place] (if you have MyChart) OR A paper copy in the mail If you have any lab test that is abnormal or we need to change your treatment, we will call you to review the results.   Testing/Procedures: Physical Therapy consult at [facility] for conditioning and gait training   Follow-Up: At [facility], you and your health needs are our priority.  As part of our continuing mission to provide you with exceptional heart care, we have created designated Provider Care Teams.  These Care Teams include your primary Cardiologist (physician) and Advanced Practice Providers (APPs -  Physician Assistants and Nurse Practitioners) who all work together to provide you with the care you need, when you need it.  We recommend signing up for the patient portal called "MyChart".  Sign up information is provided on this After Visit Summary.  MyChart is used to connect with patients for Virtual Visits (Telemedicine).  Patients are able to view lab/test results, encounter notes, upcoming appointments, etc.  Non-urgent messages can be sent to your provider as well.   To learn more about what you can do with MyChart, go to [email or web address].    Your next appointment:   3 month(s)  The format for your next appointment:   In Person  Provider:   Shirlee More, MD    Other Instructions None  Important Information About Sugar

## 2021-09-26 NOTE — Addendum Note (Signed)
Addended by: Edwyna Shell I on: 09/26/2021 11:45 AM   Modules accepted: Orders

## 2021-09-27 LAB — CBC
Hematocrit: 28.9 % — ABNORMAL LOW (ref 34.0–46.6)
Hemoglobin: 9.4 g/dL — ABNORMAL LOW (ref 11.1–15.9)
MCH: 28.9 pg (ref 26.6–33.0)
MCHC: 32.5 g/dL (ref 31.5–35.7)
MCV: 89 fL (ref 79–97)
Platelets: 250 10*3/uL (ref 150–450)
RBC: 3.25 x10E6/uL — ABNORMAL LOW (ref 3.77–5.28)
RDW: 13.8 % (ref 11.7–15.4)
WBC: 7.5 10*3/uL (ref 3.4–10.8)

## 2021-09-27 LAB — SPECIMEN STATUS REPORT

## 2021-10-20 ENCOUNTER — Inpatient Hospital Stay: Payer: PPO | Attending: Oncology | Admitting: Oncology

## 2021-10-20 VITALS — BP 211/84 | HR 111 | Temp 98.5°F | Resp 16 | Ht 66.5 in | Wt 165.5 lb

## 2021-10-20 DIAGNOSIS — Z170 Estrogen receptor positive status [ER+]: Secondary | ICD-10-CM

## 2021-10-20 DIAGNOSIS — C50422 Malignant neoplasm of upper-outer quadrant of left male breast: Secondary | ICD-10-CM

## 2021-10-20 DIAGNOSIS — C50412 Malignant neoplasm of upper-outer quadrant of left female breast: Secondary | ICD-10-CM | POA: Diagnosis not present

## 2021-10-20 NOTE — Progress Notes (Signed)
Face to face with pt who is here for Med Onc follow up with Dr. Bobby Rumpf. Pt reports that she is doing well her her Aromatase Inhibitor.

## 2021-10-20 NOTE — Progress Notes (Signed)
[facility]  [address] [place],  [place]  [address] [phone]  Clinic Day:  10/20/2021  Referring physician: Myrlene Broker, MD  HISTORY OF PRESENT ILLNESS:  The patient is an 88 y.o. 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  She currently takes anastrozole for her adjuvant endocrine therapy.  She comes in today for routine follow-up.  Since her last visit, the patient has been doing well.  She denies having any particular changes in her breasts which concern her for having early disease recurrence.  Of note, she was diagnosed with hormone positive DCIS, status post a lumpectomy in May 2015.  The patient elected not to undergo adjuvant breast radiation.  She briefly took tamoxifen, but discontinued it due to it causing numerous side effects, including undesirable weight gain and joint discomfort.    VITALS:  Blood pressure (!) 211/84, pulse (!) 111, temperature 98.5 F (36.9 C), resp. rate 16, height 5' 6.5" (1.689 m), weight 165 lb 8 oz (75.1 kg), SpO2 98 %.  Wt Readings from Last 3 Encounters:  10/20/21 165 lb 8 oz (75.1 kg)  09/26/21 167 lb 12.8 oz (76.1 kg)  07/03/21 166 lb (75.3 kg)    Body mass index is 26.31 kg/m.  Performance status (ECOG): 1 - Symptomatic but completely ambulatory  PHYSICAL EXAM:  Physical Exam Constitutional:      Appearance: Normal appearance.  HENT:     Mouth/Throat:     Pharynx: Oropharynx is clear. No oropharyngeal exudate.  Cardiovascular:     Rate and Rhythm: Normal rate and regular rhythm.     Heart sounds: No murmur heard.    No friction rub. No gallop.  Pulmonary:     Breath sounds: Normal breath sounds.  Chest:  Breasts:    Right: No swelling, bleeding, inverted nipple, mass, nipple discharge or skin change.     Left: No swelling, bleeding, inverted nipple, mass, nipple discharge or skin change.  Abdominal:      General: Bowel sounds are normal. There is no distension.     Palpations: Abdomen is soft. There is no mass.     Tenderness: There is no abdominal tenderness.  Musculoskeletal:        General: No tenderness.     Cervical back: Normal range of motion and neck supple.     Right lower leg: No edema.     Left lower leg: No edema.  Lymphadenopathy:     Cervical: No cervical adenopathy.     Right cervical: No superficial, deep or posterior cervical adenopathy.    Left cervical: No superficial, deep or posterior cervical adenopathy.     Upper Body:     Right upper body: No supraclavicular or axillary adenopathy.     Left upper body: No supraclavicular or axillary adenopathy.     Lower Body: No right inguinal adenopathy. No left inguinal adenopathy.  Skin:    Coloration: Skin is not jaundiced.     Findings: No lesion or rash.  Neurological:     General: No focal deficit present.     Mental Status: She is alert and oriented to person, place, and time. Mental status is at baseline.  Psychiatric:        Mood and Affect: Mood normal.        Behavior: Behavior normal.        Thought Content: Thought content normal.  Judgment: Judgment normal.    ASSESSMENT & PLAN:  Assessment:  An 88 y.o. 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  Based upon her clinical breast exam today, the patient remains disease-free.  He knows to continue taking her anastrozole on a daily basis to complete 5 total years of adjuvant endocrine therapy.  Clinically, the patient continues to do very well.  I will see her back in 4 months for a repeat clinical breast exam. The patient understands all the plans discussed today and is in agreement with them.  Marilyn Popko Macarthur Critchley, MD

## 2021-10-23 ENCOUNTER — Other Ambulatory Visit: Payer: Self-pay

## 2021-10-23 DIAGNOSIS — K922 Gastrointestinal hemorrhage, unspecified: Secondary | ICD-10-CM

## 2021-10-24 LAB — CBC
Hematocrit: 30.9 % — ABNORMAL LOW (ref 34.0–46.6)
Hemoglobin: 10.1 g/dL — ABNORMAL LOW (ref 11.1–15.9)
MCH: 29 pg (ref 26.6–33.0)
MCHC: 32.7 g/dL (ref 31.5–35.7)
MCV: 89 fL (ref 79–97)
Platelets: 257 10*3/uL (ref 150–450)
RBC: 3.48 x10E6/uL — ABNORMAL LOW (ref 3.77–5.28)
RDW: 14 % (ref 11.7–15.4)
WBC: 9.5 10*3/uL (ref 3.4–10.8)

## 2021-11-04 ENCOUNTER — Telehealth: Payer: Self-pay

## 2021-11-04 NOTE — Telephone Encounter (Signed)
Labs received from patients pcp for Dr Shirlee More to review

## 2021-11-24 ENCOUNTER — Other Ambulatory Visit: Payer: Self-pay

## 2021-11-24 DIAGNOSIS — K922 Gastrointestinal hemorrhage, unspecified: Secondary | ICD-10-CM

## 2021-11-25 LAB — CBC
Hematocrit: 29.6 % — ABNORMAL LOW (ref 34.0–46.6)
Hemoglobin: 9.5 g/dL — ABNORMAL LOW (ref 11.1–15.9)
MCH: 28.1 pg (ref 26.6–33.0)
MCHC: 32.1 g/dL (ref 31.5–35.7)
MCV: 88 fL (ref 79–97)
Platelets: 286 10*3/uL (ref 150–450)
RBC: 3.38 x10E6/uL — ABNORMAL LOW (ref 3.77–5.28)
RDW: 14.4 % (ref 11.7–15.4)
WBC: 7.9 10*3/uL (ref 3.4–10.8)

## 2021-12-11 ENCOUNTER — Other Ambulatory Visit: Payer: Self-pay

## 2021-12-11 ENCOUNTER — Ambulatory Visit: Payer: PPO | Attending: Cardiology

## 2021-12-11 VITALS — BP 163/82 | HR 93 | Temp 98.7°F | Resp 20

## 2021-12-11 DIAGNOSIS — I480 Paroxysmal atrial fibrillation: Secondary | ICD-10-CM

## 2021-12-11 DIAGNOSIS — Z7901 Long term (current) use of anticoagulants: Secondary | ICD-10-CM

## 2021-12-11 NOTE — Progress Notes (Signed)
   Nurse Visit   Date of Encounter: 12/11/2021 ID: Marilyn Robinson, DOB 21-Feb-1933, MRN [id number]  PCP:  Myrlene Broker, MD   [facility] Providers Cardiologist:  Jenne Campus, MD Electrophysiologist:  Vickie Epley, MD {    Visit Details   VS:  BP (!) 163/82 (BP Location: Left Arm, Patient Position: Sitting, Cuff Size: Normal)   Pulse 93   Temp 98.7 F (37.1 C)   Resp 20   SpO2 95%  , BMI There is no height or weight on file to calculate BMI.  Wt Readings from Last 3 Encounters:  10/20/21 165 lb 8 oz (75.1 kg)  09/26/21 167 lb 12.8 oz (76.1 kg)  07/03/21 166 lb (75.3 kg)     Reason for visit: nurse visit/EKG. Pt came to the office for labs and reports increased shortness of breath. Pt has no leg swelling or chest pain. Performed today: Vitals, EKG, Provider consulted:Krasowski, and Education Changes (medications, testing, etc.) : None but advised to see PCP for shortness of breath. Pt verbalized understanding and had no additional questions. Length of Visit: 15 minutes    Medications Adjustments/Labs and Tests Ordered: No orders of the defined types were placed in this encounter.  No orders of the defined types were placed in this encounter.    Signed, Truddie Hidden, RN  12/11/2021 2:42 PM

## 2021-12-12 LAB — CBC
Hematocrit: 31.6 % — ABNORMAL LOW (ref 34.0–46.6)
Hemoglobin: 10 g/dL — ABNORMAL LOW (ref 11.1–15.9)
MCH: 27.9 pg (ref 26.6–33.0)
MCHC: 31.6 g/dL (ref 31.5–35.7)
MCV: 88 fL (ref 79–97)
Platelets: 293 10*3/uL (ref 150–450)
RBC: 3.58 x10E6/uL — ABNORMAL LOW (ref 3.77–5.28)
RDW: 15 % (ref 11.7–15.4)
WBC: 7.1 10*3/uL (ref 3.4–10.8)

## 2021-12-26 ENCOUNTER — Ambulatory Visit: Payer: PPO | Admitting: Cardiology

## 2021-12-30 ENCOUNTER — Ambulatory Visit: Payer: PPO | Attending: Cardiology | Admitting: Cardiology

## 2021-12-30 ENCOUNTER — Encounter: Payer: Self-pay | Admitting: Cardiology

## 2021-12-30 VITALS — BP 168/98 | HR 58 | Ht 66.5 in | Wt 158.0 lb

## 2021-12-30 DIAGNOSIS — Z95818 Presence of other cardiac implants and grafts: Secondary | ICD-10-CM

## 2021-12-30 DIAGNOSIS — I110 Hypertensive heart disease with heart failure: Secondary | ICD-10-CM | POA: Diagnosis not present

## 2021-12-30 DIAGNOSIS — Z7901 Long term (current) use of anticoagulants: Secondary | ICD-10-CM

## 2021-12-30 DIAGNOSIS — I480 Paroxysmal atrial fibrillation: Secondary | ICD-10-CM | POA: Diagnosis not present

## 2021-12-30 DIAGNOSIS — E782 Mixed hyperlipidemia: Secondary | ICD-10-CM

## 2021-12-30 MED ORDER — APIXABAN 2.5 MG PO TABS: 2.5000 mg | ORAL_TABLET | Freq: Two times a day (BID) | ORAL | 1 refills | Status: DC

## 2021-12-30 NOTE — Patient Instructions (Signed)
Medication Instructions:  Your physician has recommended you make the following change in your medication:  Take extra Furosemide on Monday, Wednesday and Friday Start Over the counter iron to take every other day Stop Eliquis on Feb 2nd 2024  *If you need a refill on your cardiac medications before your next appointment, please call your pharmacy*   Lab Work: Your physician recommends that you return for lab work in: Today for a ProBnp, BMP and CBC  If you have labs (blood work) drawn today and your tests are completely normal, you will receive your results only by: [place] (if you have Friend) OR A paper copy in the mail If you have any lab test that is abnormal or we need to change your treatment, we will call you to review the results.   Testing/Procedures: NONE   Follow-Up: At [facility], you and your health needs are our priority.  As part of our continuing mission to provide you with exceptional heart care, we have created designated Provider Care Teams.  These Care Teams include your primary Cardiologist (physician) and Advanced Practice Providers (APPs -  Physician Assistants and Nurse Practitioners) who all work together to provide you with the care you need, when you need it.  We recommend signing up for the patient portal called "MyChart".  Sign up information is provided on this After Visit Summary.  MyChart is used to connect with patients for Virtual Visits (Telemedicine).  Patients are able to view lab/test results, encounter notes, upcoming appointments, etc.  Non-urgent messages can be sent to your provider as well.   To learn more about what you can do with MyChart, go to [email or web address].    Your next appointment:   3 month(s)  The format for your next appointment:   In Person  Provider:   Shirlee More, MD    Other Instructions   Important Information About Sugar

## 2021-12-30 NOTE — Progress Notes (Signed)
Cardiology Office Note:    Date:  12/30/2021   ID:  Marilyn Robinson, DOB 1933-10-03, MRN [id number]  PCP:  Marilyn Broker, MD  Cardiologist:  Marilyn More, MD    Referring MD: Marilyn Broker, MD    ASSESSMENT:    1. Paroxysmal atrial fibrillation (HCC)   2. Chronic anticoagulation   3. Presence of Watchman left atrial appendage closure device   4. Hypertensive heart disease with heart failure ([place])   5. Mixed dyslipidemia    PLAN:    In order of problems listed above:  Obviously she is now in persistent atrial fibrillation she is asymptomatic her rate is controlled she has had a Watchman device is anticoagulatedAt this time I would not advise antiarrhythmic drugs or cardioversion. Stable hypertensive heart disease continue with current multidrug regimen Continue her statin   Next appointment: 4 months   Medication Adjustments/Labs and Tests Ordered: Current medicines are reviewed at length with the patient today.  Concerns regarding medicines are outlined above.  No orders of the defined types were placed in this encounter.  No orders of the defined types were placed in this encounter.   Chief Complaint  Patient presents with   Follow-up   Atrial Fibrillation   Congestive Heart Failure    History of Present Illness:    Marilyn Robinson is a 88 y.o. female with a hx of complex cardiology problems including CAD hypertensive heart disease with heart failure atrial fibrillation with stroke recurrent GI bleeding watchman left atrial occlusion device perforating aortic ulcer on prewatchman CT scan and history of breast cancer with surgical lumpectomy last seen 09/26/2021 after successful Watchman device performed 07/30/2021. The decision was made to Marilyn Robinson who implanted the Watchman device to maintain her on low-dose Eliquis for 3 months and then stop antithrombotic therapy.  Compliance with diet, lifestyle and medications: Yes  When she took  extra diuretic she lost 60 pounds edema shortness of breath had resolved She indeed was in atrial fibrillation rate is controlled and she is asymptomatic She continues her anticoagulant looks like she is going to discontinue it at the beginning of February. She is living independently is pleased with the quality of her life and is not having edema shortness of breath chest pain palpitation or syncope and has not had any bleeding with her anticoagulant Continue to follow serial hemoglobins from my office  She was seen by her PCP 12/12/2021 for shortness of breath and was felt to be in decompensated heart failure.  Chest x-ray showed small bilateral pleural effusions and abnormality representing either pneumonia or heart failure.  Laboratory studies show sodium 137 potassium 4.4 creatinine 1.35 stage IIIb CKD GFR 38 cc/min and a BNP level was moderately elevated at 537.  She had nurse visit 12/11/2021 she had to be in atrial fibrillation. Past Medical History:  Diagnosis Date   Acute lacunar infarction ([place]) 10/19/2016   Anemia 07/02/2016   Benign paroxysmal vertigo 01/07/2017   Formatting of this note might be different from the original. patient still with fluid behind the ears that may be contributing to positional changes inducing vertigo. finished last dose of antibiotics today. will add nasal sprayto see if draining inner ears will improve sinus symptoms   Bilateral carotid artery stenosis 09/22/2015   BMI 31.0-31.9,adult 05/06/2015   Breast cancer ([place])    Calculus of gallbladder and bile duct without cholecystitis 01/07/2017   Candidiasis of vulva 05/22/2021   Chronic combined systolic and diastolic  heart failure ([place]) 07/17/2014   Overview:  EF 56% 09/23/10   Closed fracture of lumbar vertebra ([place]) 07/10/2008   Formatting of this note might be different from the original. Thought patient's knee pain may be referred from hip. xray of hip shows some arthritic changes, but incidentally, review of  lumbar spine shows turning of the bones. believe patient's hip and knee pain may be referred from compression of nerve in back. will request mri, and orthopedic referral   Complete rupture of rotator cuff 01/07/2017   Formatting of this note might be different from the original. patient already has her appointment scheduled for ortho for follow up   DDD (degenerative disc disease), cervical 01/07/2017   Formatting of this note might be different from the original. w/ Pain in R. Arm due to foraminal impingement   Diabetes mellitus with complication (HCC)    Diabetes mellitus without complication (HCC)    Dislocation of right knee with lateral meniscus tear 03/15/2014   Ductal carcinoma in situ (DCIS) of left breast 05/06/2015   GERD (gastroesophageal reflux disease)    Gouty arthropathy 01/07/2017   Formatting of this note might be different from the original. will check patient's uric acid level   Hyperlipidemia 07/17/2014   Hypertension    Hypertensive heart disease with heart failure ([place]) 07/17/2014   Hypertensive urgency 09/22/2015   Hypokalemia 09/22/2015   Hyponatremia 05/29/2015   Mild CAD 07/17/2014   Overview:  50% RCA stenosis in 2005   Non morbid obesity 05/06/2015   Occlusion and stenosis of carotid artery 01/07/2017   Other specified acquired hypothyroidism 01/07/2017   Paroxysmal atrial fibrillation ([place]) 05/22/2021   Pyuria 09/22/2015   Rhinosinusitis 10/14/2018   Right knee pain 05/17/2014   S/P arthroscopy of knee 05/17/2014   Secondary cardiomyopathy ([place]) 01/07/2017   Formatting of this note might be different from the original. EF repeat in 10/2006 was 69%   Stage 3a chronic kidney disease ([place]) 04/29/2020   Stroke ([place]) 02/19/2021   Stroke-like episode 05/29/2015   Stroke-like symptoms 05/29/2015   Thoracic aortic atherosclerosis ([place]) 05/06/2021   TIA (transient ischemic attack)    Uterine prolapse 07/02/2016    Past Surgical History:  Procedure Laterality Date   BREAST BIOPSY      CATARACT EXTRACTION     CHOLECYSTECTOMY     CORONARY ANGIOPLASTY WITH STENT PLACEMENT     EYE SURGERY     TUBAL LIGATION      Current Medications: Current Meds  Medication Sig   acetaminophen (TYLENOL) 500 MG tablet Take 500 mg by mouth every 6 (six) hours as needed for mild pain.   anastrozole (ARIMIDEX) 1 MG tablet Take 1 tablet (1 mg total) by mouth daily.   apixaban (ELIQUIS) 2.5 MG TABS tablet Take 2.5 mg by mouth 2 (two) times daily.   carvedilol (COREG) 25 MG tablet Take 25 mg by mouth 2 (two) times daily.    cloNIDine (CATAPRES) 0.1 MG tablet Take 1 tablet (0.1 mg total) by mouth daily as needed (Take if your blood pressure is >190 on top).   cyanocobalamin (,VITAMIN B-12,) 1000 MCG/ML injection Inject 1 mL into the muscle every 30 (thirty) days.   furosemide (LASIX) 20 MG tablet Take 1 tablet by mouth daily.   hydrALAZINE (APRESOLINE) 50 MG tablet Take 50 mg by mouth 3 (three) times daily.   Meclizine HCl 25 MG CHEW Chew 1 tablet by mouth 3 (three) times daily as needed (dizziness).   metFORMIN (GLUCOPHAGE-XR) 500 MG  24 hr tablet Take 500 mg by mouth See admin instructions. 500 mg in the morning and 1,000 at night   mirtazapine (REMERON) 15 MG tablet Take 15 mg by mouth daily.    olmesartan (BENICAR) 40 MG tablet Take 1 tablet by mouth daily.   pantoprazole (PROTONIX) 40 MG tablet Take 40 mg by mouth daily.   rosuvastatin (CRESTOR) 10 MG tablet Take 10 mg by mouth every other day.     Allergies:   Antihistamines, diphenhydramine-type; Lipitor [atorvastatin]; Penicillins; Allopurinol; Aspirin; and Fenofibrate   Social History   Socioeconomic History   Marital status: Married    Spouse name: Not on file   Number of children: Not on file   Years of education: Not on file   Highest education level: Not on file  Occupational History   Not on file  Tobacco Use   Smoking status: Former    Passive exposure: Past   Smokeless tobacco: Never  Vaping Use   Vaping Use: Never  used  Substance and Sexual Activity   Alcohol use: No   Drug use: No   Sexual activity: Not on file  Other Topics Concern   Not on file  Social History Narrative   Not on file   Social Determinants of Health   Financial Resource Strain: Not on file  Food Insecurity: Not on file  Transportation Needs: Not on file  Physical Activity: Not on file  Stress: Not on file  Social Connections: Not on file     Family History: The patient's family history includes CAD in her father and mother; Diabetes in her father and mother; Stroke in her mother. ROS:   Please see the history of present illness.    All other systems reviewed and are negative.  EKGs/Labs/Other Studies Reviewed:    The following studies were reviewed today:   Recent Labs: 08/29/2021: BUN 30; Creatinine, Ser 1.21; Potassium 5.2; Sodium 136 12/11/2021: Hemoglobin 10.0; Platelets 293  Recent Lipid Panel    Component Value Date/Time   CHOL 119 08/19/2016 1042   TRIG 107 08/19/2016 1042   HDL 41 08/19/2016 1042   CHOLHDL 3.3 05/29/2015 1549   VLDL 22 05/29/2015 1549   LDLCALC 57 08/19/2016 1042    Physical Exam:    VS:  BP (!) 168/98 (BP Location: Left Arm, Patient Position: Sitting)   Pulse (!) 58   Ht 5' 6.5" (1.689 m)   Wt 158 lb (71.7 kg)   SpO2 98%   BMI 25.12 kg/m     Wt Readings from Last 3 Encounters:  12/30/21 158 lb (71.7 kg)  10/20/21 165 lb 8 oz (75.1 kg)  09/26/21 167 lb 12.8 oz (76.1 kg)     GEN: She is there is much improved looks her age well nourished, well developed in no acute distress HEENT: Normal NECK: No JVD; No carotid bruits LYMPHATICS: No lymphadenopathy CARDIAC: Irregular rate and rhythm no murmurs, rubs, gallops RESPIRATORY:  Clear to auscultation without rales, wheezing or rhonchi  ABDOMEN: Soft, non-tender, non-distended MUSCULOSKELETAL:  No edema; No deformity  SKIN: Warm and dry NEUROLOGIC:  Alert and oriented x 3 PSYCHIATRIC:  Normal affect    Signed, Marilyn More, MD  12/30/2021 4:15 PM    [facility]

## 2021-12-31 LAB — CBC WITH DIFFERENTIAL/PLATELET
Basophils Absolute: 0.1 10*3/uL (ref 0.0–0.2)
Basos: 1 %
EOS (ABSOLUTE): 0.3 10*3/uL (ref 0.0–0.4)
Eos: 4 %
Hematocrit: 35.8 % (ref 34.0–46.6)
Hemoglobin: 11 g/dL — ABNORMAL LOW (ref 11.1–15.9)
Immature Grans (Abs): 0 10*3/uL (ref 0.0–0.1)
Immature Granulocytes: 0 %
Lymphocytes Absolute: 1.3 10*3/uL (ref 0.7–3.1)
Lymphs: 16 %
MCH: 27.4 pg (ref 26.6–33.0)
MCHC: 30.7 g/dL — ABNORMAL LOW (ref 31.5–35.7)
MCV: 89 fL (ref 79–97)
Monocytes Absolute: 0.8 10*3/uL (ref 0.1–0.9)
Monocytes: 10 %
Neutrophils Absolute: 5.4 10*3/uL (ref 1.4–7.0)
Neutrophils: 69 %
Platelets: 329 10*3/uL (ref 150–450)
RBC: 4.01 x10E6/uL (ref 3.77–5.28)
RDW: 14.8 % (ref 11.7–15.4)
WBC: 7.8 10*3/uL (ref 3.4–10.8)

## 2021-12-31 LAB — BASIC METABOLIC PANEL
BUN/Creatinine Ratio: 28 (ref 12–28)
BUN: 52 mg/dL — ABNORMAL HIGH (ref 8–27)
CO2: 20 mmol/L (ref 20–29)
Calcium: 10.2 mg/dL (ref 8.7–10.3)
Chloride: 99 mmol/L (ref 96–106)
Creatinine, Ser: 1.86 mg/dL — ABNORMAL HIGH (ref 0.57–1.00)
Glucose: 144 mg/dL — ABNORMAL HIGH (ref 70–99)
Potassium: 4.9 mmol/L (ref 3.5–5.2)
Sodium: 138 mmol/L (ref 134–144)
eGFR: 26 mL/min/{1.73_m2} — ABNORMAL LOW (ref >59)

## 2021-12-31 LAB — PRO B NATRIURETIC PEPTIDE: NT-Pro BNP: 3425 pg/mL — ABNORMAL HIGH (ref 0–738)

## 2022-02-10 ENCOUNTER — Other Ambulatory Visit: Payer: Self-pay

## 2022-02-10 DIAGNOSIS — Z7901 Long term (current) use of anticoagulants: Secondary | ICD-10-CM

## 2022-02-11 LAB — CBC
Hematocrit: 31.4 % — ABNORMAL LOW (ref 34.0–46.6)
Hemoglobin: 10 g/dL — ABNORMAL LOW (ref 11.1–15.9)
MCH: 27.6 pg (ref 26.6–33.0)
MCHC: 31.8 g/dL (ref 31.5–35.7)
MCV: 87 fL (ref 79–97)
Platelets: 252 10*3/uL (ref 150–450)
RBC: 3.62 x10E6/uL — ABNORMAL LOW (ref 3.77–5.28)
RDW: 15.7 % — ABNORMAL HIGH (ref 11.7–15.4)
WBC: 8 10*3/uL (ref 3.4–10.8)

## 2022-02-12 ENCOUNTER — Other Ambulatory Visit: Payer: Self-pay

## 2022-02-12 DIAGNOSIS — Z7901 Long term (current) use of anticoagulants: Secondary | ICD-10-CM

## 2022-02-18 DIAGNOSIS — I361 Nonrheumatic tricuspid (valve) insufficiency: Secondary | ICD-10-CM | POA: Diagnosis not present

## 2022-02-19 DIAGNOSIS — R509 Fever, unspecified: Secondary | ICD-10-CM | POA: Diagnosis not present

## 2022-02-19 DIAGNOSIS — I6389 Other cerebral infarction: Secondary | ICD-10-CM | POA: Diagnosis not present

## 2022-02-19 DIAGNOSIS — Z8719 Personal history of other diseases of the digestive system: Secondary | ICD-10-CM

## 2022-02-19 DIAGNOSIS — Z853 Personal history of malignant neoplasm of breast: Secondary | ICD-10-CM

## 2022-02-19 DIAGNOSIS — I480 Paroxysmal atrial fibrillation: Secondary | ICD-10-CM | POA: Diagnosis not present

## 2022-02-19 DIAGNOSIS — E785 Hyperlipidemia, unspecified: Secondary | ICD-10-CM

## 2022-02-19 DIAGNOSIS — I6529 Occlusion and stenosis of unspecified carotid artery: Secondary | ICD-10-CM | POA: Diagnosis not present

## 2022-02-20 ENCOUNTER — Ambulatory Visit: Payer: PPO | Admitting: Oncology

## 2022-02-20 DIAGNOSIS — I480 Paroxysmal atrial fibrillation: Secondary | ICD-10-CM | POA: Diagnosis not present

## 2022-02-20 DIAGNOSIS — I6389 Other cerebral infarction: Secondary | ICD-10-CM | POA: Diagnosis not present

## 2022-02-20 DIAGNOSIS — I6529 Occlusion and stenosis of unspecified carotid artery: Secondary | ICD-10-CM | POA: Diagnosis not present

## 2022-02-20 DIAGNOSIS — R509 Fever, unspecified: Secondary | ICD-10-CM | POA: Diagnosis not present

## 2022-02-21 DIAGNOSIS — I5033 Acute on chronic diastolic (congestive) heart failure: Secondary | ICD-10-CM | POA: Diagnosis not present

## 2022-02-21 DIAGNOSIS — I480 Paroxysmal atrial fibrillation: Secondary | ICD-10-CM | POA: Diagnosis not present

## 2022-02-21 DIAGNOSIS — Z8719 Personal history of other diseases of the digestive system: Secondary | ICD-10-CM | POA: Diagnosis not present

## 2022-02-21 DIAGNOSIS — I2510 Atherosclerotic heart disease of native coronary artery without angina pectoris: Secondary | ICD-10-CM

## 2022-02-21 DIAGNOSIS — N179 Acute kidney failure, unspecified: Secondary | ICD-10-CM | POA: Diagnosis not present

## 2022-02-22 DIAGNOSIS — R2681 Unsteadiness on feet: Secondary | ICD-10-CM | POA: Insufficient documentation

## 2022-02-22 DIAGNOSIS — M6281 Muscle weakness (generalized): Secondary | ICD-10-CM | POA: Insufficient documentation

## 2022-02-22 DIAGNOSIS — Z8719 Personal history of other diseases of the digestive system: Secondary | ICD-10-CM | POA: Diagnosis not present

## 2022-02-22 DIAGNOSIS — I639 Cerebral infarction, unspecified: Secondary | ICD-10-CM | POA: Insufficient documentation

## 2022-02-22 DIAGNOSIS — I480 Paroxysmal atrial fibrillation: Secondary | ICD-10-CM | POA: Diagnosis not present

## 2022-02-22 DIAGNOSIS — I5033 Acute on chronic diastolic (congestive) heart failure: Secondary | ICD-10-CM | POA: Insufficient documentation

## 2022-02-22 DIAGNOSIS — R41841 Cognitive communication deficit: Secondary | ICD-10-CM | POA: Insufficient documentation

## 2022-02-22 DIAGNOSIS — N179 Acute kidney failure, unspecified: Secondary | ICD-10-CM | POA: Insufficient documentation

## 2022-02-22 DIAGNOSIS — I2510 Atherosclerotic heart disease of native coronary artery without angina pectoris: Secondary | ICD-10-CM | POA: Insufficient documentation

## 2022-02-22 DIAGNOSIS — M810 Age-related osteoporosis without current pathological fracture: Secondary | ICD-10-CM | POA: Insufficient documentation

## 2022-02-22 DIAGNOSIS — R269 Unspecified abnormalities of gait and mobility: Secondary | ICD-10-CM | POA: Insufficient documentation

## 2022-02-22 DIAGNOSIS — I739 Peripheral vascular disease, unspecified: Secondary | ICD-10-CM | POA: Insufficient documentation

## 2022-02-22 DIAGNOSIS — I6389 Other cerebral infarction: Secondary | ICD-10-CM

## 2022-02-22 DIAGNOSIS — R279 Unspecified lack of coordination: Secondary | ICD-10-CM | POA: Insufficient documentation

## 2022-02-22 DIAGNOSIS — I252 Old myocardial infarction: Secondary | ICD-10-CM | POA: Insufficient documentation

## 2022-02-22 DIAGNOSIS — E1151 Type 2 diabetes mellitus with diabetic peripheral angiopathy without gangrene: Secondary | ICD-10-CM | POA: Insufficient documentation

## 2022-03-11 NOTE — Progress Notes (Unsigned)
Cardiology Office Note:    Date:  03/12/2022   ID:  Marilyn Robinson, DOB 1933-12-19, MRN [id number]  PCP:  Marilyn Broker, MD  Cardiologist:  Marilyn More, MD    Referring MD: Marilyn Broker, MD    ASSESSMENT:    1. Paroxysmal atrial fibrillation (HCC)   2. Presence of Watchman left atrial appendage closure device   3. Hypertensive heart disease with heart failure ([place])   4. Anemia, unspecified type    PLAN:    In order of problems listed above:  Is a very difficult case she had a Watchman device because she was a very high risk for anticoagulation with recurrent bleeding on both clopidogrel and anticoagulant and now has had a stroke with the Watchman device after stopping anticoagulant.  I will reach out to Dr. Delfino Robinson who did the procedure to see if he has any insight she may require a dedicated cardiac CT or TEE.  I would not restart antiplatelet or anticoagulant therapy pending neurology evaluation Stable hypertension continue current treatment not fluid overloaded continue her current diuretic Severely anemic I will have her restart aspirin 5 g daily   Next appointment: 3 months   Medication Adjustments/Labs and Tests Ordered: Current medicines are reviewed at length with the patient today.  Concerns regarding medicines are outlined above.  No orders of the defined types were placed in this encounter.  No orders of the defined types were placed in this encounter.   Chief Complaint  Patient presents with   Follow-up  She had a recurrent stroke she is due to be seen by neurology first week of April for decision about reinstituting antithrombotic therapy  History of Present Illness:    Marilyn Robinson is a 89 y.o. female with a hx of quite complex heart disease including CAD hypertensive heart disease with heart failure and stage IV CKD atrial fibrillation with stroke recurrent GI bleeding inability to anticoagulate and subsequent watchman  left atrial occlusion device penetrating aortic ulcer on predevice CT scan and history of breast cancer with surgical lumpectomy as well as chronic anemia last seen 12/30/2021.  At the time of her last visit she was in persistent atrial fibrillation with rate control and heart failure was compensated after increase in outpatient diuretic.  Discharge from [facility] 02/22/2022 with a discharge diagnosis of stroke it appears as if antithrombotic therapy was discontinued with arrangements being followed up by neurology about reinstitution.  Although the hospital she had recurrent atrial fibrillation she had pneumonia and was treated with IV antibiotics.  Pulm there is comments that anticoagulation is being deferred to cardiology but she does not appear to have been seen by cardiology during the hospitalization there is a notation that it was discussed.  Prior to discharge hemoglobin was 7.7 creatinine 1.3 potassium discharge summary does not discuss her anemia notation that the hemoglobin dropped and she had no active signs of bleeding.  She was not transfused.    Compliance with diet, lifestyle and medications: Yes  Week before she was due to stop Eliquis she had nosebleeds and reduced her anticoagulant to once daily Urgently had another stroke She is on no antiplatelet or anticoagulant therapy her daughter tells me there was some hemorrhage on the image She is due to be seen by neurology the first week of November Past Medical History:  Diagnosis Date   Acute lacunar infarction ([place]) 10/19/2016   Anemia 07/02/2016   Benign paroxysmal vertigo 01/07/2017   Formatting  of this note might be different from the original. patient still with fluid behind the ears that may be contributing to positional changes inducing vertigo. finished last dose of antibiotics today. will add nasal sprayto see if draining inner ears will improve sinus symptoms   Bilateral carotid artery stenosis 09/22/2015   BMI  31.0-31.9,adult 05/06/2015   Breast cancer ([place])    Calculus of gallbladder and bile duct without cholecystitis 01/07/2017   Candidiasis of vulva 05/22/2021   Chronic combined systolic and diastolic heart failure ([place]) 07/17/2014   Overview:  EF 56% 09/23/10   Closed fracture of lumbar vertebra ([place]) 07/10/2008   Formatting of this note might be different from the original. Thought patient's knee pain may be referred from hip. xray of hip shows some arthritic changes, but incidentally, review of lumbar spine shows turning of the bones. believe patient's hip and knee pain may be referred from compression of nerve in back. will request mri, and orthopedic referral   Complete rupture of rotator cuff 01/07/2017   Formatting of this note might be different from the original. patient already has her appointment scheduled for ortho for follow up   DDD (degenerative disc disease), cervical 01/07/2017   Formatting of this note might be different from the original. w/ Pain in R. Arm due to foraminal impingement   Diabetes mellitus with complication (HCC)    Diabetes mellitus without complication (HCC)    Dislocation of right knee with lateral meniscus tear 03/15/2014   Ductal carcinoma in situ (DCIS) of left breast 05/06/2015   GERD (gastroesophageal reflux disease)    Gouty arthropathy 01/07/2017   Formatting of this note might be different from the original. will check patient's uric acid level   Hyperlipidemia 07/17/2014   Hypertension    Hypertensive heart disease with heart failure ([place]) 07/17/2014   Hypertensive urgency 09/22/2015   Hypokalemia 09/22/2015   Hyponatremia 05/29/2015   Mild CAD 07/17/2014   Overview:  50% RCA stenosis in 2005   Non morbid obesity 05/06/2015   Occlusion and stenosis of carotid artery 01/07/2017   Other specified acquired hypothyroidism 01/07/2017   Paroxysmal atrial fibrillation ([place]) 05/22/2021   Pyuria 09/22/2015   Rhinosinusitis 10/14/2018   Right knee pain 05/17/2014   S/P  arthroscopy of knee 05/17/2014   Secondary cardiomyopathy ([place]) 01/07/2017   Formatting of this note might be different from the original. EF repeat in 10/2006 was 69%   Stage 3a chronic kidney disease ([place]) 04/29/2020   Stroke ([place]) 02/19/2021   Stroke-like episode 05/29/2015   Stroke-like symptoms 05/29/2015   Thoracic aortic atherosclerosis ([place]) 05/06/2021   TIA (transient ischemic attack)    Uterine prolapse 07/02/2016    Past Surgical History:  Procedure Laterality Date   BREAST BIOPSY     CATARACT EXTRACTION     CHOLECYSTECTOMY     CORONARY ANGIOPLASTY WITH STENT PLACEMENT     EYE SURGERY     TUBAL LIGATION      Current Medications: Current Meds  Medication Sig   acetaminophen (TYLENOL) 325 MG tablet Take 650 mg by mouth every 4 (four) hours as needed for mild pain or moderate pain.   anastrozole (ARIMIDEX) 1 MG tablet Take 1 tablet (1 mg total) by mouth daily.   carvedilol (COREG) 12.5 MG tablet Take 12.5 mg by mouth 2 (two) times daily with a meal.   cyanocobalamin (,VITAMIN B-12,) 1000 MCG/ML injection Inject 1 mL into the muscle every 30 (thirty) days.   furosemide (LASIX) 40 MG tablet  Take 40 mg by mouth every other day.   magnesium oxide (MAG-OX) 400 (240 Mg) MG tablet Take 400 mg by mouth daily.   metFORMIN (GLUCOPHAGE) 1000 MG tablet Take 1,000 mg by mouth at bedtime.   metFORMIN (GLUCOPHAGE-XR) 500 MG 24 hr tablet Take 500 mg by mouth daily with breakfast. 500 mg in the morning and 1,000 at night   mirtazapine (REMERON) 15 MG tablet Take 15 mg by mouth daily.    pantoprazole (PROTONIX) 40 MG tablet Take 40 mg by mouth daily.   rosuvastatin (CRESTOR) 20 MG tablet Take 20 mg by mouth daily.   [DISCONTINUED] cloNIDine (CATAPRES) 0.1 MG tablet Take 1 tablet (0.1 mg total) by mouth daily as needed (Take if your blood pressure is >190 on top).   [DISCONTINUED] INSULIN LISPRO, 1 UNIT DIAL, [place] Inject into the skin.     Allergies:   Antihistamines, diphenhydramine-type; Lipitor  [atorvastatin]; Penicillins; Allopurinol; Aspirin; and Fenofibrate   Social History   Socioeconomic History   Marital status: Married    Spouse name: Not on file   Number of children: Not on file   Years of education: Not on file   Highest education level: Not on file  Occupational History   Not on file  Tobacco Use   Smoking status: Former    Passive exposure: Past   Smokeless tobacco: Never  Vaping Use   Vaping Use: Never used  Substance and Sexual Activity   Alcohol use: No   Drug use: No   Sexual activity: Not on file  Other Topics Concern   Not on file  Social History Narrative   Not on file   Social Determinants of Health   Financial Resource Strain: Not on file  Food Insecurity: Not on file  Transportation Needs: Not on file  Physical Activity: Not on file  Stress: Not on file  Social Connections: Not on file     Family History: The patient's family history includes CAD in her father and mother; Diabetes in her father and mother; Stroke in her mother. ROS:   Please see the history of present illness.    All other systems reviewed and are negative.  EKGs/Labs/Other Studies Reviewed:    The following studies were reviewed today:   Recent Labs: 12/30/2021: BUN 52; Creatinine, Ser 1.86; NT-Pro BNP 3,425; Potassium 4.9; Sodium 138 02/10/2022: Hemoglobin 10.0; Platelets 252  Recent Lipid Panel    Component Value Date/Time   CHOL 119 08/19/2016 1042   TRIG 107 08/19/2016 1042   HDL 41 08/19/2016 1042   CHOLHDL 3.3 05/29/2015 1549   VLDL 22 05/29/2015 1549   LDLCALC 57 08/19/2016 1042    Physical Exam:    VS:  BP 120/60 (BP Location: Right Arm, Patient Position: Sitting, Cuff Size: Normal)   Pulse 80   Ht 5' 6.5" (1.689 m)   Wt 155 lb (70.3 kg)   SpO2 98%   BMI 24.64 kg/m     Wt Readings from Last 3 Encounters:  03/12/22 155 lb (70.3 kg)  12/30/21 158 lb (71.7 kg)  10/20/21 165 lb 8 oz (75.1 kg)     GEN:  Well nourished, well developed in no  acute distress HEENT: Normal NECK: No JVD; No carotid bruits LYMPHATICS: No lymphadenopathy CARDIAC: RRR, no murmurs, rubs, gallops RESPIRATORY:  Clear to auscultation without rales, wheezing or rhonchi  ABDOMEN: Soft, non-tender, non-distended MUSCULOSKELETAL:  No edema; No deformity  SKIN: Warm and dry NEUROLOGIC:  Alert and oriented x 3 PSYCHIATRIC:  Normal affect    Signed, Marilyn More, MD  03/12/2022 4:42 PM    [facility]

## 2022-03-12 ENCOUNTER — Ambulatory Visit: Payer: PPO | Attending: Cardiology | Admitting: Cardiology

## 2022-03-12 ENCOUNTER — Encounter: Payer: Self-pay | Admitting: Cardiology

## 2022-03-12 VITALS — BP 120/60 | HR 80 | Ht 66.5 in | Wt 155.0 lb

## 2022-03-12 DIAGNOSIS — I110 Hypertensive heart disease with heart failure: Secondary | ICD-10-CM | POA: Diagnosis not present

## 2022-03-12 DIAGNOSIS — I480 Paroxysmal atrial fibrillation: Secondary | ICD-10-CM

## 2022-03-12 DIAGNOSIS — D649 Anemia, unspecified: Secondary | ICD-10-CM

## 2022-03-12 DIAGNOSIS — Z95818 Presence of other cardiac implants and grafts: Secondary | ICD-10-CM

## 2022-03-12 MED ORDER — IRON 325 (65 FE) MG PO TABS: 325.0000 mg | ORAL_TABLET | Freq: Every day | ORAL | 3 refills | Status: DC

## 2022-03-12 NOTE — Addendum Note (Signed)
Addended by: Edwyna Shell I on: 03/12/2022 05:09 PM   Modules accepted: Orders

## 2022-03-12 NOTE — Patient Instructions (Signed)
Medication Instructions:  Your physician has recommended you make the following change in your medication:   START: Ferrous sulfate (Iron) 325 mg daily  *If you need a refill on your cardiac medications before your next appointment, please call your pharmacy*   Lab Work: None If you have labs (blood work) drawn today and your tests are completely normal, you will receive your results only by: [place] (if you have MyChart) OR A paper copy in the mail If you have any lab test that is abnormal or we need to change your treatment, we will call you to review the results.   Testing/Procedures: None   Follow-Up: At [facility], you and your health needs are our priority.  As part of our continuing mission to provide you with exceptional heart care, we have created designated Provider Care Teams.  These Care Teams include your primary Cardiologist (physician) and Advanced Practice Providers (APPs -  Physician Assistants and Nurse Practitioners) who all work together to provide you with the care you need, when you need it.  We recommend signing up for the patient portal called "MyChart".  Sign up information is provided on this After Visit Summary.  MyChart is used to connect with patients for Virtual Visits (Telemedicine).  Patients are able to view lab/test results, encounter notes, upcoming appointments, etc.  Non-urgent messages can be sent to your provider as well.   To learn more about what you can do with MyChart, go to [email or web address].    Your next appointment:   3 month(s)  Provider:   Shirlee More, MD    Other Instructions None

## 2022-03-13 ENCOUNTER — Ambulatory Visit: Payer: PPO | Admitting: Cardiology

## 2022-04-09 ENCOUNTER — Ambulatory Visit: Payer: PPO | Admitting: Cardiology

## 2022-04-14 ENCOUNTER — Encounter: Payer: Self-pay | Admitting: Neurology

## 2022-04-14 ENCOUNTER — Ambulatory Visit (INDEPENDENT_AMBULATORY_CARE_PROVIDER_SITE_OTHER): Payer: PPO | Admitting: Neurology

## 2022-04-14 VITALS — BP 162/77 | HR 95 | Ht 66.6 in | Wt 158.8 lb

## 2022-04-14 DIAGNOSIS — I63511 Cerebral infarction due to unspecified occlusion or stenosis of right middle cerebral artery: Secondary | ICD-10-CM

## 2022-04-14 DIAGNOSIS — I482 Chronic atrial fibrillation, unspecified: Secondary | ICD-10-CM

## 2022-04-14 DIAGNOSIS — G3184 Mild cognitive impairment, so stated: Secondary | ICD-10-CM

## 2022-04-14 DIAGNOSIS — Z95818 Presence of other cardiac implants and grafts: Secondary | ICD-10-CM | POA: Diagnosis not present

## 2022-04-14 DIAGNOSIS — I6521 Occlusion and stenosis of right carotid artery: Secondary | ICD-10-CM | POA: Diagnosis not present

## 2022-04-14 NOTE — Progress Notes (Signed)
[facility] [address] [place]. [place] [address] [phone]       OFFICE CONSULT NOTE  Ms. Marilyn Robinson Date of Birth:  May 21, 1933 Medical Record Number:  [id number]   Referring MD: Dewitt Hoes  Reason for Referral: Stroke  HPI: Marilyn Robinson is a 89 year old pleasant Caucasian lady seen today for initial office consultation visit for stroke.  History is obtained from the patient and her daughter who is accompanying her as well as review of referral notes and I personally reviewed pertinent available imaging films in PACS.  She is 89 year old lady with past medical history of chronic atrial fibrillation, coronary artery disease, hypertension, congestive heart failure, myocardial infarction, hyperlipidemia, cardiomyopathy, gout, anemia and diabetes, benign positional vertigo, no syncope and previous stroke.  She presented to [facility] 02/18/2022 with confusion.  She had difficulty getting her robe on and while cooking she left before boiling did not know what to do.  Was found to have low-grade fever of 100.7 and also urinary frequency and urgency and some pain.  CT scan of the head showed acute right parietal lobe hypodensity likely with infarct.  CT angiogram showed no LVO but 60% proximal right ICA and 40% left ICA stenosis.  MRI scan showed a large subacute right posterior MCA infarct with hemorrhagic transformation with mild mass effect but no midline shift.  There was a remote age left PICA territory infarct small right pontine and left basal ganglia infarcts as well.  Transthoracic echo showed ejection fraction of 55 to 60% with severely dilated left atrium and moderately dilated right atrium with no definite clot.   patient had chronic A-fib and prior history of bleeding on aspirin close labeled as allergies.  She also had major bleeding on aspirin and Plavix and hence underwent Watchman procedure [facility] July 2023.  Following this she was on low-dose Eliquis  2.5 twice daily until 2 days prior to her stroke when it was discontinued due to nasal bleeding at that time.  She developed atrial fibrillation with rapid ventricular response while in the hospital and Coreg was added.  She was started on antibiotics for presumptive pneumonia anticoagulation or antiplatelet therapy was not immediately started due to concerns of possible endocarditis.  She was discharged skilled nursing facility good recovery he is presently living at home.  She has a caregiver during the day and at night.  She is able to ambulate with a walker.  She has had no falls or injuries.  She saw Dr. Fortunato Curling vascular surgeon for 60% right carotid stenosis noted during the current stroke admission.  She is scheduled for elective right carotid surgery and Dr. Carlis Abbott recommends patient be started on Plavix today and not Eliquis.  Patient and daughter have more or less decided on doing the carotid surgery.Patient is scheduled to undergo outpatient transesophageal echocardiogram at [facility] but she is not knowing the exact date of the procedure.  ESR was 33 mm.  C-reactive protein was mildly elevated at 5.34.  LDL cholesterol was 34.6.  Hemoglobin A1c was 7.5.  Patient states she has done well since discharge.  She more or less independent in most activities though she gets still little confused and disoriented from time to time.  She is persistent left-sided peripheral visual deficits not improved completely.  Patient has a prescription for Plavix but she has not started taking it yet  ROS:   14 system review of systems is positive for vision difficulties, difficulties with concentration and confusion  all other systems negative  PMH:  Past Medical History:  Diagnosis Date   Acute lacunar infarction 10/19/2016   Anemia 07/02/2016   Benign paroxysmal vertigo 01/07/2017   Formatting of this note might be different from the original. patient still with fluid behind the ears that may be  contributing to positional changes inducing vertigo. finished last dose of antibiotics today. will add nasal sprayto see if draining inner ears will improve sinus symptoms   Bilateral carotid artery stenosis 09/22/2015   BMI 31.0-31.9,adult 05/06/2015   Breast cancer    Calculus of gallbladder and bile duct without cholecystitis 01/07/2017   Candidiasis of vulva 05/22/2021   Chronic combined systolic and diastolic heart failure [id number]   Overview:  EF 56% 09/23/10   Closed fracture of lumbar vertebra 07/10/2008   Formatting of this note might be different from the original. Thought patient's knee pain may be referred from hip. xray of hip shows some arthritic changes, but incidentally, review of lumbar spine shows turning of the bones. believe patient's hip and knee pain may be referred from compression of nerve in back. will request mri, and orthopedic referral   Complete rupture of rotator cuff 01/07/2017   Formatting of this note might be different from the original. patient already has her appointment scheduled for ortho for follow up   DDD (degenerative disc disease), cervical 01/07/2017   Formatting of this note might be different from the original. w/ Pain in R. Arm due to foraminal impingement   Diabetes mellitus with complication    Diabetes mellitus without complication    Dislocation of right knee with lateral meniscus tear 03/15/2014   Ductal carcinoma in situ (DCIS) of left breast 05/06/2015   GERD (gastroesophageal reflux disease)    Gouty arthropathy 01/07/2017   Formatting of this note might be different from the original. will check patient's uric acid level   Hyperlipidemia 07/17/2014   Hypertension    Hypertensive heart disease with heart failure 07/17/2014   Hypertensive urgency 09/22/2015   Hypokalemia 09/22/2015   Hyponatremia 05/29/2015   Mild CAD 07/17/2014   Overview:  50% RCA stenosis in 2005   Non morbid obesity 05/06/2015   Occlusion and stenosis of carotid artery 01/07/2017    Other specified acquired hypothyroidism 01/07/2017   Paroxysmal atrial fibrillation 05/22/2021   Pyuria 09/22/2015   Rhinosinusitis 10/14/2018   Right knee pain 05/17/2014   S/P arthroscopy of knee 05/17/2014   Secondary cardiomyopathy 01/07/2017   Formatting of this note might be different from the original. EF repeat in 10/2006 was 69%   Stage 3a chronic kidney disease 04/29/2020   Stroke 02/19/2021   Stroke-like episode 05/29/2015   Stroke-like symptoms 05/29/2015   Thoracic aortic atherosclerosis 05/06/2021   TIA (transient ischemic attack)    Uterine prolapse 07/02/2016    Social History:  Social History   Socioeconomic History   Marital status: Married    Spouse name: Not on file   Number of children: Not on file   Years of education: Not on file   Highest education level: Not on file  Occupational History   Not on file  Tobacco Use   Smoking status: Former    Passive exposure: Past   Smokeless tobacco: Never  Vaping Use   Vaping Use: Never used  Substance and Sexual Activity   Alcohol use: No   Drug use: No   Sexual activity: Not on file  Other Topics Concern   Not on file  Social History Narrative  Not on file   Social Determinants of Health   Financial Resource Strain: Not on file  Food Insecurity: Not on file  Transportation Needs: Not on file  Physical Activity: Not on file  Stress: Not on file  Social Connections: Not on file  Intimate Partner Violence: Not on file    Medications:   Current Outpatient Medications on File Prior to Visit  Medication Sig Dispense Refill   acetaminophen (TYLENOL) 325 MG tablet Take 650 mg by mouth every 4 (four) hours as needed for mild pain or moderate pain.     anastrozole (ARIMIDEX) 1 MG tablet Take 1 tablet (1 mg total) by mouth daily. 90 tablet 3   carvedilol (COREG) 12.5 MG tablet Take 12.5 mg by mouth 2 (two) times daily with a meal.     cyanocobalamin (,VITAMIN B-12,) 1000 MCG/ML injection Inject 1 mL into the muscle  every 30 (thirty) days.     Ferrous Sulfate (IRON) 325 (65 Fe) MG TABS Take 1 tablet (325 mg total) by mouth daily. 90 tablet 3   magnesium oxide (MAG-OX) 400 (240 Mg) MG tablet Take 400 mg by mouth daily.     metFORMIN (GLUCOPHAGE-XR) 500 MG 24 hr tablet Take 500 mg by mouth daily with breakfast. 500 mg in the morning and 1,000 at night     mirtazapine (REMERON) 15 MG tablet Take 15 mg by mouth daily.      olmesartan (BENICAR) 40 MG tablet Take 1 tablet by mouth daily.     pantoprazole (PROTONIX) 40 MG tablet Take 40 mg by mouth daily.     rosuvastatin (CRESTOR) 20 MG tablet Take 20 mg by mouth daily.     clopidogrel (PLAVIX) 75 MG tablet Take 75 mg by mouth daily. (Patient not taking: Reported on 04/14/2022)     No current facility-administered medications on file prior to visit.    Allergies:   Allergies  Allergen Reactions   Antihistamines, Diphenhydramine-Type Other (See Comments)    Causes headaches   Lipitor [Atorvastatin] Other (See Comments)    Caused muscle weakness, but can tolerate a low dose of Crestor   Penicillins     Has patient had a PCN reaction causing immediate rash, facial/tongue/throat swelling, SOB or lightheadedness with hypotension: Yes Has patient had a PCN reaction causing severe rash involving [id number] membranes or skin necrosis: No Has patient had a PCN reaction that required hospitalization: No Has patient had a PCN reaction occurring within the last 10 years: No If all of the above answers are "NO", then may proceed with Cephalosporin use.    Allopurinol Rash   Aspirin Nausea And Vomiting and Other (See Comments)    bleeding   Fenofibrate Rash    Pruritus, Rash    Physical Exam General: Pleasant elderly Caucasian lady seated, in no evident distress Head: head normocephalic and atraumatic.   Neck: supple with no carotid or supraclavicular bruits Cardiovascular: regular rate and rhythm, no murmurs Musculoskeletal: no deformity Skin:  no  rash/petichiae Vascular:  Normal pulses all extremities  Neurologic Exam Mental Status: Awake and fully alert. Oriented to place and time. Recent and remote memory intact. Attention span, concentration and fund of knowledge appropriate. Mood and affect appropriate.  Diminished recall 1/3.  Able to name only 7 animals which can walk on 4 legs.  Clock drawing 3/4. Cranial Nerves: Fundoscopic exam reveals sharp disc margins. Pupils equal, briskly reactive to light. Extraocular movements full without nystagmus. Visual fields full show partial left homonymous hemianopsia confrontation. Hearing  intact. Facial sensation intact. Face, tongue, palate moves normally and symmetrically.  Motor: Normal bulk and tone. Normal strength in all tested extremity muscles.  Diminished fine finger movements on the left.  Mild left grip weakness.  Orbits right over left upper extremity. Sensory.: intact to touch , pinprick , position and vibratory sensation.  Coordination: Rapid alternating movements normal in all extremities. Finger-to-nose and heel-to-shin performed accurately bilaterally. Gait and Station: Arises from chair without difficulty. Stance is normal. Gait demonstrates slight dragging of the left leg..  Not able to heel, toe and tandem walk.   Reflexes: 1+ and asymmetric and slightly brisker on the left. Toes downgoing.   NIHSS  2 Modified Rankin  2   ASSESSMENT: 89 year old pleasant Caucasian lady with right middle cerebral artery embolic infarct in February 2024 exact etiology indeterminate from moderate right carotid stenosis versus chronic atrial fibrillation s/p Watchman device off anticoagulation.  Vascular risk factors of diabetes, hypertension, hyperlipidemia, carotid stenosis, , MI, CHF, prior strokes and chronic atrial fibrillation s/p Watchman device in July 23.     PLAN: I had a long d/w patient and her daughter about her recent stroke, chronic atrial fibrillation and moderate carotid artery  stenosis risk for recurrent stroke/TIAs, personally independently reviewed imaging studies and stroke evaluation results and answered questions.Continue Plavix ( clopidogrel )75 mg daily  for secondary stroke prevention and maintain strict control of hypertension with blood pressure goal below 130/90, diabetes with hemoglobin A1c goal below 6.5% and lipids with LDL cholesterol goal below 70 mg/dL. I also advised the patient to eat a healthy diet with plenty of whole grains, cereals, fruits and vegetables, exercise regularly and maintain ideal body weight .check TEE to look for Watchman device thrombosis.  Patient advised not to drive till his peripheral vision loss improves.  Continue plan for elective right carotid revascularization by Dr. Carlis Abbott.  If she has recurrent TIA or strokes may consider switching Plavix to low-dose Eliquis.  Followup in the future with me in 3 to 4 months or call earlier if necessary.  Greater than 50% time during the prolonged 60-minute consultation visit was spent on counseling and coordination of care about her recent stroke and discussion about anticoagulation and Watchman procedure and carotid stenosis and revascularization and answering questions. Antony Contras, MD  Note: This document was prepared with digital dictation and possible smart phrase technology. Any transcriptional errors that result from this process are unintentional.

## 2022-04-14 NOTE — Patient Instructions (Signed)
I had a long d/w patient and her daughter about her recent stroke, chronic atrial fibrillation and moderate carotid artery stenosis risk for recurrent stroke/TIAs, personally independently reviewed imaging studies and stroke evaluation results and answered questions.Continue Plavix ( clopidogrel )75 mg daily  for secondary stroke prevention and maintain strict control of hypertension with blood pressure goal below 130/90, diabetes with hemoglobin A1c goal below 6.5% and lipids with LDL cholesterol goal below 70 mg/dL. I also advised the patient to eat a healthy diet with plenty of whole grains, cereals, fruits and vegetables, exercise regularly and maintain ideal body weight .check TEE to look for Watchman device thrombosis.  Patient advised not to drive till his peripheral vision loss improves.  Continue plan for elective right carotid revascularization by Dr. Carlis Abbott.  If she has recurrent TIA or strokes may consider switching Plavix to low-dose Eliquis.  Followup in the future with me in 3 to 4 months or call earlier if necessary.  Stroke Prevention Some medical conditions and behaviors can lead to a higher chance of having a stroke. You can help prevent a stroke by eating healthy, exercising, not smoking, and managing any medical conditions you have. Stroke is a leading cause of functional impairment. Primary prevention is particularly important because a majority of strokes are first-time events. Stroke changes the lives of not only those who experience a stroke but also their family and other caregivers. How can this condition affect me? A stroke is a medical emergency and should be treated right away. A stroke can lead to brain damage and can sometimes be life-threatening. If a person gets medical treatment right away, there is a better chance of surviving and recovering from a stroke. What can increase my risk? The following medical conditions may increase your risk of a stroke: Cardiovascular  disease. High blood pressure (hypertension). Diabetes. High cholesterol. Sickle cell disease. Blood clotting disorders (hypercoagulable state). Obesity. Sleep disorders (obstructive sleep apnea). Other risk factors include: Being older than age 75. Having a history of blood clots, stroke, or mini-stroke (transient ischemic attack, TIA). Genetic factors, such as race, ethnicity, or a family history of stroke. Smoking cigarettes or using other tobacco products. Taking birth control pills, especially if you also use tobacco. Heavy use of alcohol or drugs, especially cocaine and methamphetamine. Physical inactivity. What actions can I take to prevent this? Manage your health conditions High cholesterol levels. Eating a healthy diet is important for preventing high cholesterol. If cholesterol cannot be managed through diet alone, you may need to take medicines. Take any prescribed medicines to control your cholesterol as told by your health care provider. Hypertension. To reduce your risk of stroke, try to keep your blood pressure below 130/80. Eating a healthy diet and exercising regularly are important for controlling blood pressure. If these steps are not enough to manage your blood pressure, you may need to take medicines. Take any prescribed medicines to control hypertension as told by your health care provider. Ask your health care provider if you should monitor your blood pressure at home. Have your blood pressure checked every year, even if your blood pressure is normal. Blood pressure increases with age and some medical conditions. Diabetes. Eating a healthy diet and exercising regularly are important parts of managing your blood sugar (glucose). If your blood sugar cannot be managed through diet and exercise, you may need to take medicines. Take any prescribed medicines to control your diabetes as told by your health care provider. Get evaluated for obstructive sleep apnea.  Talk to  your health care provider about getting a sleep evaluation if you snore a lot or have excessive sleepiness. Make sure that any other medical conditions you have, such as atrial fibrillation or atherosclerosis, are managed. Nutrition Follow instructions from your health care provider about what to eat or drink to help manage your health condition. These instructions may include: Reducing your daily calorie intake. Limiting how much salt (sodium) you use to 1,500 milligrams (mg) each day. Using only healthy fats for cooking, such as olive oil, canola oil, or sunflower oil. Eating healthy foods. You can do this by: Choosing foods that are high in fiber, such as whole grains, and fresh fruits and vegetables. Eating at least 5 servings of fruits and vegetables a day. Try to fill one-half of your plate with fruits and vegetables at each meal. Choosing lean protein foods, such as lean cuts of meat, poultry without skin, fish, tofu, beans, and nuts. Eating low-fat dairy products. Avoiding foods that are high in sodium. This can help lower blood pressure. Avoiding foods that have saturated fat, trans fat, and cholesterol. This can help prevent high cholesterol. Avoiding processed and prepared foods. Counting your daily carbohydrate intake.  Lifestyle If you drink alcohol: Limit how much you have to: 0-1 drink a day for women who are not pregnant. 0-2 drinks a day for men. Know how much alcohol is in your drink. In the [place] one drink equals one 12 oz bottle of beer (352mL), one 5 oz glass of wine (173mL), or one 1 oz glass of hard liquor (90mL). Do not use any products that contain nicotine or tobacco. These products include cigarettes, chewing tobacco, and vaping devices, such as e-cigarettes. If you need help quitting, ask your health care provider. Avoid secondhand smoke. Do not use drugs. Activity  Try to stay at a healthy weight. Get at least 30 minutes of exercise on most days, such  as: Fast walking. Biking. Swimming. Medicines Take over-the-counter and prescription medicines only as told by your health care provider. Aspirin or blood thinners (antiplatelets or anticoagulants) may be recommended to reduce your risk of forming blood clots that can lead to stroke. Avoid taking birth control pills. Talk to your health care provider about the risks of taking birth control pills if: You are over 84 years old. You smoke. You get very bad headaches. You have had a blood clot. Where to find more information American Stroke Association: www.strokeassociation.org Get help right away if: You or a loved one has any symptoms of a stroke. "BE FAST" is an easy way to remember the main warning signs of a stroke: B - Balance. Signs are dizziness, sudden trouble walking, or loss of balance. E - Eyes. Signs are trouble seeing or a sudden change in vision. F - Face. Signs are sudden weakness or numbness of the face, or the face or eyelid drooping on one side. A - Arms. Signs are weakness or numbness in an arm. This happens suddenly and usually on one side of the body. S - Speech. Signs are sudden trouble speaking, slurred speech, or trouble understanding what people say. T - Time. Time to call emergency services. Write down what time symptoms started. You or a loved one has other signs of a stroke, such as: A sudden, severe headache with no known cause. Nausea or vomiting. Seizure. These symptoms may represent a serious problem that is an emergency. Do not wait to see if the symptoms will go away. Get medical help  right away. Call your local emergency services (911 in the [place] Do not drive yourself to the hospital. Summary You can help to prevent a stroke by eating healthy, exercising, not smoking, limiting alcohol intake, and managing any medical conditions you may have. Do not use any products that contain nicotine or tobacco. These include cigarettes, chewing tobacco, and vaping  devices, such as e-cigarettes. If you need help quitting, ask your health care provider. Remember "BE FAST" for warning signs of a stroke. Get help right away if you or a loved one has any of these signs. This information is not intended to replace advice given to you by your health care provider. Make sure you discuss any questions you have with your health care provider. Document Revised: 07/13/2019 Document Reviewed: 07/31/2019 Elsevier Patient Education  [place].

## 2022-05-27 ENCOUNTER — Telehealth: Payer: Self-pay | Admitting: Vascular Surgery

## 2022-05-27 ENCOUNTER — Encounter: Payer: Self-pay | Admitting: Vascular Surgery

## 2022-05-27 NOTE — Telephone Encounter (Signed)
called pt to schedule f/u. Pt stated she is now following up with a vascular surgeon at [facility] and has scheduled a surgery with them so she will no longer follow up with [facility]

## 2022-06-09 ENCOUNTER — Other Ambulatory Visit: Payer: Self-pay

## 2022-06-10 NOTE — Progress Notes (Unsigned)
Cardiology Office Note:    Date:  06/11/2022   ID:  Marilyn Robinson, DOB 18-Jun-1933, MRN [id number]  PCP:  Hadley Pen, MD  Cardiologist:  Norman Herrlich, MD    Referring MD: Hadley Pen, MD    ASSESSMENT:    1. Paroxysmal atrial fibrillation (HCC)   2. Presence of Watchman left atrial appendage closure device   3. Hypertensive heart disease with heart failure (HCC)   4. Anemia, unspecified type   5. Stenosis of carotid artery, unspecified laterality   6. Mild CAD    PLAN:    In order of problems listed above:  From a cardiology perspective Marilyn Robinson is doing well pending her right carotid endarterectomy.  At this time she is maintained on antiplatelet therapy after Watchman device with clopidogrel stable CAD not having anginal discomfort on medical therapy heart failure is compensated with her current loop diuretic as needed and blood pressure which has been very problematic in the past is well-controlled on her current medical regimen including carvedilol Benicar and hydralazine.  In my opinion she is optimized for the planned vascular surgery Previously she has had occult blood loss as well as clinical bleeding iron deficiency anemia and I will recheck a CBC in the office today   Next appointment: 3 months   Medication Adjustments/Labs and Tests Ordered: Current medicines are reviewed at length with the patient today.  Concerns regarding medicines are outlined above.  No orders of the defined types were placed in this encounter.  No orders of the defined types were placed in this encounter.  Chief complaint, I would not have carotid surgery at [facility]   History of Present Illness:    Marilyn Robinson is a 89 y.o. female with a hx of  quite complex heart disease including CAD hypertensive heart disease with heart failure and stage IV CKD atrial fibrillation with stroke recurrent GI bleeding inability to anticoagulate and subsequent watchman  left atrial occlusion device penetrating aortic ulcer on predevice CT scan and history of breast cancer with surgical lumpectomy as well as chronic anemia  last seen 03/12/2022.  She is awaiting right carotid endarterectomy [facility] Dr. Cherly Hensen with a 60% right ICA stenosis with an area of ulcerated plaque associated with stroke.  She is on Plavix tolerating it no overt bleeding we will check a CBC today with her previous anemia Cardiac perspective is doing well and is not having angina edema shortness of breath palpitation or syncope  Compliance with diet, lifestyle and medications: Yes Past Medical History:  Diagnosis Date   Acute lacunar infarction (HCC) 10/19/2016   Ambulatory dysfunction 06/13/2019   Anemia 07/02/2016   At high risk for injury related to fall 10/05/2016   At risk for falls 01/07/2017   Benign paroxysmal vertigo 01/07/2017   Formatting of this note might be different from the original. patient still with fluid behind the ears that may be contributing to positional changes inducing vertigo. finished last dose of antibiotics today. will add nasal sprayto see if draining inner ears will improve sinus symptoms   Bilateral carotid artery stenosis 09/22/2015   BMI 31.0-31.9,adult 05/06/2015   Breast cancer (HCC)    Calculus of gallbladder and bile duct without cholecystitis 01/07/2017   Candidiasis of vulva 05/22/2021   Chronic combined systolic and diastolic heart failure (HCC) 07/17/2014   Overview:  EF 56% 09/23/10   Chronic insomnia 04/14/2018   Closed fracture of lumbar vertebra (HCC) 07/10/2008   Formatting  of this note might be different from the original. Thought patient's knee pain may be referred from hip. xray of hip shows some arthritic changes, but incidentally, review of lumbar spine shows turning of the bones. believe patient's hip and knee pain may be referred from compression of nerve in back. will request mri, and orthopedic referral    Complete rupture of rotator cuff 01/07/2017   Formatting of this note might be different from the original. patient already has her appointment scheduled for ortho for follow up   Continuous leakage of urine 10/05/2016   DDD (degenerative disc disease), cervical 01/07/2017   Formatting of this note might be different from the original. w/ Pain in R. Arm due to foraminal impingement   Diabetes mellitus with complication (HCC)    Diabetes mellitus without complication (HCC)    Dislocation of right knee with lateral meniscus tear 03/15/2014   Ductal carcinoma in situ (DCIS) of left breast 05/06/2015   Environmental and seasonal allergies 10/05/2016   Flu vaccine need 10/05/2016   GERD (gastroesophageal reflux disease)    Gout 01/07/2017   Gouty arthropathy 01/07/2017   Formatting of this note might be different from the original. will check patient's uric acid level   Hospital discharge follow-up 11/09/2016   Hyperlipidemia 07/17/2014   Hypertension    Hypertensive heart disease with heart failure (HCC) 07/17/2014   Hypertensive urgency 09/22/2015   Hypo-osmolality and hyponatremia 05/29/2015   Formatting of this note might be different from the original. Will recheck patient's sodium level to see if it has returned to normal range --repeat labs today Formatting of this note might be different from the original. Formatting of this note might be different from the original. 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   Major depressive disorder with single episode, in full remission (HCC) 11/01/2020   Malaise and fatigue 06/13/2019   Mass of upper outer quadrant of left breast 04/21/2021    Mild CAD 07/17/2014   Overview:  50% RCA stenosis in 2005   Mixed dyslipidemia 08/25/2017   Need for vaccination against Streptococcus pneumoniae using pneumococcal conjugate vaccine 13 10/05/2016   Non morbid obesity 05/06/2015   Occlusion and stenosis of carotid artery 01/07/2017   Other specified acquired hypothyroidism 01/07/2017   Paroxysmal atrial fibrillation (HCC) 05/22/2021   Peripheral edema 04/30/2017   Personal history of transient ischemic attack (TIA), and cerebral infarction without residual deficits 10/23/2016   Preop cardiovascular exam 01/07/2017   Formatting of this note might be different from the original.  REviewed patient's personalized wellness plan. she is current with screenings.   Pyuria 09/22/2015   Rhinosinusitis 10/14/2018   S/P arthroscopy of knee 05/17/2014   Secondary cardiomyopathy (HCC) 01/07/2017   Formatting of this note might be different from the original. EF repeat in 10/2006 was 69%   Stage 3a chronic kidney disease (HCC) 04/29/2020   Stress due to illness of family member 10/23/2016   Stroke (HCC) 02/19/2021   Stroke-like episode 05/29/2015   Stroke-like symptoms 05/29/2015   Syncope and collapse 01/07/2017   Formatting of this note might be different from the original.  will have patient stop the hydralazine, stop the probenecide, and cut her furosemide in 1/2. Will  have her check her blood pressures at home and see if her diastolic pressures will start to come back up.   Thoracic aortic atherosclerosis (HCC) 05/06/2021   TIA (transient ischemic attack)    Uterine prolapse 07/02/2016   Vitamin D deficiency 11/01/2020   Weakness 05/22/2021    Past Surgical History:  Procedure Laterality Date   BREAST BIOPSY     CATARACT EXTRACTION     CHOLECYSTECTOMY     CORONARY ANGIOPLASTY WITH STENT PLACEMENT     EYE SURGERY     TUBAL LIGATION      Current Medications: Current Meds  Medication Sig   acetaminophen (TYLENOL) 325 MG tablet Take 650  mg by mouth every 4 (four) hours as needed for mild pain or moderate pain.   anastrozole (ARIMIDEX) 1 MG tablet Take 1 tablet (1 mg total) by mouth daily.   carvedilol (COREG) 25 MG tablet Take 25 mg by mouth 2 (two) times daily.   clopidogrel (PLAVIX) 75 MG tablet Take 75 mg by mouth daily.   cyanocobalamin (,VITAMIN B-12,) 1000 MCG/ML injection Inject 1 mL into the muscle every 30 (thirty) days.   Ferrous Sulfate (IRON) 325 (65 Fe) MG TABS Take 1 tablet (325 mg total) by mouth daily.   furosemide (LASIX) 20 MG tablet Take 20 mg by mouth as needed for fluid or edema.   hydrALAZINE (APRESOLINE) 50 MG tablet Take 50 mg by mouth 3 (three) times daily.   magnesium oxide (MAG-OX) 400 (240 Mg) MG tablet Take 400 mg by mouth daily.   metFORMIN (GLUCOPHAGE-XR) 500 MG 24 hr tablet Take 500 mg by mouth daily with breakfast. 500 mg in the morning and 1,000 at night   mirtazapine (REMERON) 15 MG tablet Take 15 mg by mouth daily.    olmesartan (BENICAR) 40 MG tablet Take 1 tablet by mouth daily.   pantoprazole (PROTONIX) 40 MG tablet Take 40 mg by mouth daily.   rosuvastatin (CRESTOR) 10 MG tablet Take 10 mg by mouth daily.   XARELTO 2.5 MG TABS tablet Take 2.5 mg by mouth daily.     Allergies:   Antihistamines, diphenhydramine-type; Lipitor [atorvastatin]; Loratadine; Penicillins; Allopurinol; Aspirin; and Fenofibrate   Social History   Socioeconomic History   Marital status: Married    Spouse name: Not on file   Number of children: Not on file   Years of education: Not on file   Highest education level: Not on file  Occupational History   Not on file  Tobacco Use   Smoking status: Former    Passive exposure: Past   Smokeless tobacco: Never  Vaping Use   Vaping Use: Never used  Substance and Sexual Activity   Alcohol use: No   Drug use: No   Sexual activity: Not on file  Other Topics Concern   Not on file  Social History Narrative   Not on file   Social Determinants of Health    Financial Resource Strain: Not on file  Food Insecurity: Not on file  Transportation Needs: Not on file  Physical Activity: Not on file  Stress: Not on file  Social Connections: Not on file     Family History: The patient's family history includes CAD in her father and mother; Diabetes in her father and mother; Stroke in her mother. ROS:   Please see the history of present illness.    All other systems reviewed and are negative.  EKGs/Labs/Other Studies Reviewed:    The following studies were reviewed today:  Cardiac  Studies & Procedures     STRESS TESTS  MYOCARDIAL PERFUSION IMAGING 05/27/2021  Narrative   The study is normal. The study is low risk.   No ST deviation was noted.   Left ventricular function is normal. Nuclear stress EF: 58 %. The left ventricular ejection fraction is normal (55-65%). End diastolic cavity size is normal.   Prior study not available for comparison.   ECHOCARDIOGRAM  ECHOCARDIOGRAM COMPLETE 05/30/2015  Narrative *[facility]* *[facility]* [address] [place], [place] [address] [phone]  ------------------------------------------------------------------- Transthoracic Echocardiography  Patient:    Marilyn Robinson, Marilyn Robinson MR #:       [id number] Study Date: 05/30/2015 Gender:     F Age:        76 Height:     167.6 cm Weight:     82.6 kg BSA:        1.98 m^2 Pt. Status: Room:       5W33C  PERFORMING   W. Viann Fish, MD [facility] SONOGRAPHER  Nolon Rod, RDCS ADMITTING    Perrin Smack REFERRING    [place], Lesle Chris ATTENDING    Lyndal Pulley  cc:  ------------------------------------------------------------------- LV EF: 55% -   60%  ------------------------------------------------------------------- Indications:      CVA 436.  ------------------------------------------------------------------- History:   PMH:   Transient ischemic attack.  Risk factors:  Former tobacco use.  Hypertension. Diabetes mellitus.  ------------------------------------------------------------------- Study Conclusions  - Left ventricle: The cavity size was normal. Wall thickness was increased in a pattern of moderate LVH. There was moderate concentric hypertrophy. Systolic function was normal. The estimated ejection fraction was in the range of 55% to 60%. Wall motion was normal; there were no regional wall motion abnormalities. - Mitral valve: Mildly calcified annulus. There was mild regurgitation. - Left atrium: The atrium was mildly dilated.  Impressions:  - No cardiac source of emboli was indentified.  Transthoracic echocardiography.  M-mode, complete 2D, spectral Doppler, and color Doppler.  Birthdate:  Patient birthdate: 1933-11-17.  Age:  Patient is 89 yr old.  Sex:  Gender: female. BMI: 29.4 kg/m^2.  Blood pressure:     155/51  Patient status: Inpatient.  Study date:  Study date: 05/30/2015. Study time: 12:03 PM.  Location:  Bedside.  -------------------------------------------------------------------  ------------------------------------------------------------------- Left ventricle:  The cavity size was normal. Wall thickness was increased in a pattern of moderate LVH. There was moderate concentric hypertrophy. Systolic function was normal. The estimated ejection fraction was in the range of 55% to 60%. Wall motion was normal; there were no regional wall motion abnormalities.  ------------------------------------------------------------------- Aortic valve:   Trileaflet; normal thickness, mildly calcified leaflets. Mobility was not restricted.  Doppler:  Transvalvular velocity was within the normal range. There was no stenosis. There was no regurgitation.  ------------------------------------------------------------------- Aorta:  Aortic root: The aortic root was normal in size.  ------------------------------------------------------------------- Mitral  valve:   Mildly calcified annulus. Mobility was not restricted.  Doppler:  Transvalvular velocity was within the normal range. There was no evidence for stenosis. There was mild regurgitation.    Peak gradient (D): 2 mm Hg.  ------------------------------------------------------------------- Left atrium:  The atrium was mildly dilated.  ------------------------------------------------------------------- Right ventricle:  The cavity size was normal. Wall thickness was normal. Systolic function was normal.  ------------------------------------------------------------------- Pulmonic valve:   Not well visualized.  Doppler:  Transvalvular velocity was within the normal range. There was no evidence for stenosis.  ------------------------------------------------------------------- Tricuspid valve:   Structurally normal valve.  Doppler: Transvalvular velocity was within the normal range. There was mild regurgitation.  ------------------------------------------------------------------- Pulmonary artery:   The main pulmonary artery was normal-sized. Systolic pressure was within the normal range.  ------------------------------------------------------------------- Right atrium:  The atrium was normal in size.  ------------------------------------------------------------------- Pericardium:  There was no pericardial effusion.  ------------------------------------------------------------------- Systemic veins: Inferior vena cava: The vessel was normal in size.  ------------------------------------------------------------------- Measurements  Left ventricle                           Value        Reference LV ID, ED, PLAX chordal        (L)       42.5  mm     43 - 52 LV ID, ES, PLAX chordal                  30.7  mm     23 - 38 LV fx shortening, PLAX chordal (L)       28    %      >=29 LV PW thickness, ED                      12.2  mm     --------- IVS/LV PW ratio, ED                       1.23         <=1.3 LV e&', lateral                           6.74  cm/s   --------- LV E/e&', lateral                         11.16        --------- LV e&', medial                            5.66  cm/s   --------- LV E/e&', medial                          13.29        --------- LV e&', average                           6.2   cm/s   --------- LV E/e&', average                         12.13        ---------  Ventricular septum                       Value        Reference IVS thickness, ED                        15    mm     ---------  LVOT                                     Value        Reference LVOT ID, S  19    mm     --------- LVOT area                                2.84  cm^2   ---------  Aorta                                    Value        Reference Aortic root ID, ED                       32    mm     ---------  Left atrium                              Value        Reference LA ID, A-P, ES                           36    mm     --------- LA ID/bsa, A-P                           1.81  cm/m^2 <=2.2 LA volume, S                             46.2  ml     --------- LA volume/bsa, S                         23.3  ml/m^2 --------- LA volume, ES, 1-p A4C                   31.9  ml     --------- LA volume/bsa, ES, 1-p A4C               16.1  ml/m^2 --------- LA volume, ES, 1-p A2C                   62.9  ml     --------- LA volume/bsa, ES, 1-p A2C               31.7  ml/m^2 ---------  Mitral valve                             Value        Reference Mitral E-wave peak velocity              75.2  cm/s   --------- Mitral A-wave peak velocity              71.3  cm/s   --------- Mitral deceleration time       (L)       148   ms     150 - 230 Mitral peak gradient, D                  2     mm Hg  --------- Mitral E/A ratio, peak                   1.1          ---------  Right ventricle                          Value        Reference RV s&', lateral, S                         11.9  cm/s   ---------  Legend: (L)  and  (H)  mark values outside specified reference range.  ------------------------------------------------------------------- Prepared and Electronically Authenticated by  Georga Hacking, MD [facility] 2017-05-18T13:30:37    MONITORS  CARDIAC EVENT MONITOR 03/24/2022             Recent Labs: 12/30/2021: BUN 52; Creatinine, Ser 1.86; NT-Pro BNP 3,425; Potassium 4.9; Sodium 138 02/10/2022: Hemoglobin 10.0; Platelets 252  Recent Lipid Panel    Component Value Date/Time   CHOL 119 08/19/2016 1042   TRIG 107 08/19/2016 1042   HDL 41 08/19/2016 1042   CHOLHDL 3.3 05/29/2015 1549   VLDL 22 05/29/2015 1549   LDLCALC 57 08/19/2016 1042    Physical Exam:    VS:  BP 116/62   Pulse 78   Ht 5' 6.6" (1.692 m)   Wt 149 lb 12.8 oz (67.9 kg)   SpO2 98%   BMI 23.74 kg/m     Wt Readings from Last 3 Encounters:  06/11/22 149 lb 12.8 oz (67.9 kg)  04/14/22 158 lb 12.8 oz (72 kg)  03/12/22 155 lb (70.3 kg)     GEN: She has lost weight but does not look frail cognitively intact quite social and pleasant well nourished, well developed in no acute distress HEENT: Normal NECK: No JVD; No carotid bruits LYMPHATICS: No lymphadenopathy CARDIAC: RRR, no murmurs, rubs, gallops RESPIRATORY:  Clear to auscultation without rales, wheezing or rhonchi  ABDOMEN: Soft, non-tender, non-distended MUSCULOSKELETAL:  No edema; No deformity  SKIN: Warm and dry NEUROLOGIC:  Alert and oriented x 3 PSYCHIATRIC:  Normal affect    Signed, Norman Herrlich, MD  06/11/2022 2:31 PM    [facility]

## 2022-06-11 ENCOUNTER — Ambulatory Visit: Payer: PPO | Attending: Cardiology | Admitting: Cardiology

## 2022-06-11 ENCOUNTER — Encounter: Payer: Self-pay | Admitting: Cardiology

## 2022-06-11 VITALS — BP 116/62 | HR 78 | Ht 66.6 in | Wt 149.8 lb

## 2022-06-11 DIAGNOSIS — I480 Paroxysmal atrial fibrillation: Secondary | ICD-10-CM | POA: Diagnosis not present

## 2022-06-11 DIAGNOSIS — D649 Anemia, unspecified: Secondary | ICD-10-CM

## 2022-06-11 DIAGNOSIS — I110 Hypertensive heart disease with heart failure: Secondary | ICD-10-CM | POA: Diagnosis not present

## 2022-06-11 DIAGNOSIS — Z95818 Presence of other cardiac implants and grafts: Secondary | ICD-10-CM

## 2022-06-11 DIAGNOSIS — I6529 Occlusion and stenosis of unspecified carotid artery: Secondary | ICD-10-CM

## 2022-06-11 DIAGNOSIS — I2510 Atherosclerotic heart disease of native coronary artery without angina pectoris: Secondary | ICD-10-CM

## 2022-06-11 NOTE — Addendum Note (Signed)
Addended by: Roxanne Mins I on: 06/11/2022 02:46 PM   Modules accepted: Orders

## 2022-06-11 NOTE — Patient Instructions (Signed)
Medication Instructions:  Your physician recommends that you continue on your current medications as directed. Please refer to the Current Medication list given to you today.  *If you need a refill on your cardiac medications before your next appointment, please call your pharmacy*   Lab Work: Your physician recommends that you return for lab work in:   Labs today: BMP, CBC  If you have labs (blood work) drawn today and your tests are completely normal, you will receive your results only by: MyChart Message (if you have MyChart) OR A paper copy in the mail If you have any lab test that is abnormal or we need to change your treatment, we will call you to review the results.   Testing/Procedures: None   Follow-Up: At [facility], you and your health needs are our priority.  As part of our continuing mission to provide you with exceptional heart care, we have created designated Provider Care Teams.  These Care Teams include your primary Cardiologist (physician) and Advanced Practice Providers (APPs -  Physician Assistants and Nurse Practitioners) who all work together to provide you with the care you need, when you need it.  We recommend signing up for the patient portal called "MyChart".  Sign up information is provided on this After Visit Summary.  MyChart is used to connect with patients for Virtual Visits (Telemedicine).  Patients are able to view lab/test results, encounter notes, upcoming appointments, etc.  Non-urgent messages can be sent to your provider as well.   To learn more about what you can do with MyChart, go to [email or web address].    Your next appointment:   3 month(s)  Provider:   Norman Herrlich, MD    Other Instructions None

## 2022-06-12 ENCOUNTER — Telehealth: Payer: Self-pay | Admitting: *Deleted

## 2022-06-12 DIAGNOSIS — I480 Paroxysmal atrial fibrillation: Secondary | ICD-10-CM

## 2022-06-12 DIAGNOSIS — I110 Hypertensive heart disease with heart failure: Secondary | ICD-10-CM

## 2022-06-12 DIAGNOSIS — Z7901 Long term (current) use of anticoagulants: Secondary | ICD-10-CM

## 2022-06-12 LAB — CBC

## 2022-06-12 LAB — BASIC METABOLIC PANEL
BUN/Creatinine Ratio: 18 (ref 12–28)
BUN: 26 mg/dL (ref 8–27)
CO2: 17 mmol/L — ABNORMAL LOW (ref 20–29)
Calcium: 9.3 mg/dL (ref 8.7–10.3)
Chloride: 103 mmol/L (ref 96–106)
Creatinine, Ser: 1.45 mg/dL — ABNORMAL HIGH (ref 0.57–1.00)
Glucose: 187 mg/dL — ABNORMAL HIGH (ref 70–99)
Potassium: 4.5 mmol/L (ref 3.5–5.2)
Sodium: 136 mmol/L (ref 134–144)
eGFR: 35 mL/min/{1.73_m2} — ABNORMAL LOW (ref >59)

## 2022-06-12 NOTE — Telephone Encounter (Signed)
Pt needed repeat CBC due to insufficient sample

## 2022-06-13 LAB — CBC WITH DIFFERENTIAL/PLATELET
Basophils Absolute: 0.1 10*3/uL (ref 0.0–0.2)
Basos: 1 %
EOS (ABSOLUTE): 0.1 10*3/uL (ref 0.0–0.4)
Eos: 2 %
Hematocrit: 28.6 % — ABNORMAL LOW (ref 34.0–46.6)
Hemoglobin: 8.7 g/dL — ABNORMAL LOW (ref 11.1–15.9)
Immature Grans (Abs): 0 10*3/uL (ref 0.0–0.1)
Immature Granulocytes: 0 %
Lymphocytes Absolute: 0.9 10*3/uL (ref 0.7–3.1)
Lymphs: 13 %
MCH: 27.2 pg (ref 26.6–33.0)
MCHC: 30.4 g/dL — ABNORMAL LOW (ref 31.5–35.7)
MCV: 89 fL (ref 79–97)
Monocytes Absolute: 0.5 10*3/uL (ref 0.1–0.9)
Monocytes: 7 %
Neutrophils Absolute: 5.4 10*3/uL (ref 1.4–7.0)
Neutrophils: 77 %
Platelets: 254 10*3/uL (ref 150–450)
RBC: 3.2 x10E6/uL — ABNORMAL LOW (ref 3.77–5.28)
RDW: 13.6 % (ref 11.7–15.4)
WBC: 6.9 10*3/uL (ref 3.4–10.8)

## 2022-06-15 ENCOUNTER — Other Ambulatory Visit: Payer: Self-pay

## 2022-06-15 DIAGNOSIS — D649 Anemia, unspecified: Secondary | ICD-10-CM

## 2022-06-19 ENCOUNTER — Other Ambulatory Visit: Payer: Self-pay | Admitting: Oncology

## 2022-06-19 LAB — CBC
Hematocrit: 27.8 % — ABNORMAL LOW (ref 34.0–46.6)
Hemoglobin: 8.5 g/dL — ABNORMAL LOW (ref 11.1–15.9)
MCH: 27.2 pg (ref 26.6–33.0)
MCHC: 30.6 g/dL — ABNORMAL LOW (ref 31.5–35.7)
MCV: 89 fL (ref 79–97)
Platelets: 245 10*3/uL (ref 150–450)
RBC: 3.12 x10E6/uL — ABNORMAL LOW (ref 3.77–5.28)
RDW: 13.8 % (ref 11.7–15.4)
WBC: 7.9 10*3/uL (ref 3.4–10.8)

## 2022-06-25 ENCOUNTER — Telehealth: Payer: Self-pay

## 2022-06-25 DIAGNOSIS — D649 Anemia, unspecified: Secondary | ICD-10-CM

## 2022-06-25 NOTE — Telephone Encounter (Signed)
-----   Message from Heywood Bene, [place] sent at 06/19/2022 10:20 AM EDT -----  ----- Message ----- From: Baldo Daub, MD Sent: 06/19/2022   7:38 AM EDT To: [facility]/Hp Triage  Recheck next week if her hemoglobin breaks below 8.0 she will need outpatient transfusion

## 2022-07-02 NOTE — Progress Notes (Signed)
[facility]  [address] [place],  [place]  [address] [phone]  Clinic Day:  07/03/2022  Referring physician: Hadley Pen, MD  HISTORY OF PRESENT ILLNESS:  The patient is an 89 y.o. 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  She currently takes anastrozole for her adjuvant endocrine therapy.  She comes in today for routine follow-up.  Since her last visit, the patient has been doing okay.  She has had right carotid artery performed recently due to neurologic deficits being appreciated.  There has been bleeding seen at her surgical site, which has made her somewhat anemic and weak.  As it pertains to her breast cancer, she denies having any particular changes in her breasts which concern her for disease recurrence.  Of note, her annual mammogram in April 2024 showed no evidence of disease recurrence.    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  The patient elected not to undergo adjuvant breast radiation.  She briefly took tamoxifen, but discontinued it due to it causing numerous side effects, including undesirable weight gain and joint discomfort.    VITALS:  Blood pressure 120/78, pulse 78, temperature 98.6 F (37 C), resp. rate 14, height 5' 6.5" (1.689 m), weight 149 lb (67.6 kg), SpO2 97 %.  Wt Readings from Last 3 Encounters:  07/03/22 149 lb (67.6 kg)  06/11/22 149 lb 12.8 oz (67.9 kg)  04/14/22 158 lb 12.8 oz (72 kg)    Body mass index is 23.69 kg/m.  Performance status (ECOG): 1 - Symptomatic but completely ambulatory  PHYSICAL EXAM:  Physical Exam Constitutional:      Appearance: Normal appearance.  HENT:     Mouth/Throat:     Pharynx: Oropharynx is clear. No oropharyngeal exudate.  Cardiovascular:     Rate and Rhythm: Normal rate and regular rhythm.     Heart sounds: No murmur heard.    No  friction rub. No gallop.  Pulmonary:     Breath sounds: Normal breath sounds.  Chest:  Breasts:    Right: No swelling, bleeding, inverted nipple, mass, nipple discharge or skin change.     Left: No swelling, bleeding, inverted nipple, mass, nipple discharge or skin change.  Abdominal:     General: Bowel sounds are normal. There is no distension.     Palpations: Abdomen is soft. There is no mass.     Tenderness: There is no abdominal tenderness.  Musculoskeletal:        General: No tenderness.     Cervical back: Normal range of motion and neck supple.     Right lower leg: No edema.     Left lower leg: No edema.  Lymphadenopathy:     Cervical: No cervical adenopathy.     Right cervical: No superficial, deep or posterior cervical adenopathy.    Left cervical: No superficial, deep or posterior cervical adenopathy.     Upper Body:     Right upper body: No supraclavicular or axillary adenopathy.     Left upper body: No supraclavicular or axillary adenopathy.     Lower Body: No right inguinal adenopathy. No left inguinal adenopathy.  Skin:    Coloration: Skin is not jaundiced.     Findings: No lesion or rash.  Neurological:     General: No focal deficit present.     Mental Status: She  is alert and oriented to person, place, and time. Mental status is at baseline.  Psychiatric:        Mood and Affect: Mood normal.        Behavior: Behavior normal.        Thought Content: Thought content normal.        Judgment: Judgment normal.   LABS:    Latest Reference Range & Units 07/03/22 00:00  WBC  7.6 (E)  RBC 3.87 - 5.11  2.86 ! (E)  Hemoglobin 12.0 - 16.0  7.9 ! (E)  HCT 36 - 46  25 ! (E)  Platelets 150 - 400 K/uL 246 (E)  NEUT#  6.16 (E)  !: Data is abnormal (E): External lab result  ASSESSMENT & PLAN:  Assessment:  An 89 y.o. 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  Based upon her clinical breast exam today  and recent mammogram , the patient remains disease-free.  She knows to continue taking her anastrozole on a daily basis to complete 5 total years of adjuvant endocrine therapy.  Unfortunately, she remains anemic from her carotid endarterectomy surgery from earlier this week to where a blood transfusion may need to be considered.  The patient will need to follow with her vascular surgeon in the forthcoming days to ensure no significant postsurgical complications exist.  From a breast cancer perspective, the patient is doing well.  I will see her back in 4 months for a repeat clinical breast exam. The patient understands all the plans discussed today and is in agreement with them.  Valaria Kohut Kirby Funk, MD

## 2022-07-03 ENCOUNTER — Other Ambulatory Visit: Payer: Self-pay | Admitting: Oncology

## 2022-07-03 ENCOUNTER — Inpatient Hospital Stay: Payer: PPO

## 2022-07-03 ENCOUNTER — Inpatient Hospital Stay: Payer: PPO | Attending: Oncology | Admitting: Oncology

## 2022-07-03 DIAGNOSIS — Z170 Estrogen receptor positive status [ER+]: Secondary | ICD-10-CM | POA: Diagnosis not present

## 2022-07-03 DIAGNOSIS — C50412 Malignant neoplasm of upper-outer quadrant of left female breast: Secondary | ICD-10-CM

## 2022-07-03 DIAGNOSIS — Z79811 Long term (current) use of aromatase inhibitors: Secondary | ICD-10-CM

## 2022-07-03 LAB — CBC AND DIFFERENTIAL
HCT: 25 — AB (ref 36–46)
Hemoglobin: 7.9 — AB (ref 12.0–16.0)
Neutrophils Absolute: 6.16
Platelets: 246 10*3/uL (ref 150–400)
WBC: 7.6

## 2022-07-03 LAB — CBC: RBC: 2.86 — AB (ref 3.87–5.11)

## 2022-07-03 MED ORDER — ANASTROZOLE 1 MG PO TABS: 1.0000 mg | ORAL_TABLET | Freq: Every day | ORAL | 3 refills | Status: DC

## 2022-07-06 ENCOUNTER — Telehealth: Payer: Self-pay | Admitting: Oncology

## 2022-07-06 NOTE — Telephone Encounter (Signed)
Patient has been scheduled. Aware of appt date and time    Scheduling Message Entered by Rennis Harding A on 07/03/2022 at  7:45 PM Priority: Routine  <No visit type provided>  Department: [facility]  Provider:  Scheduling Notes:  Appt 11-02-22

## 2022-07-20 ENCOUNTER — Other Ambulatory Visit: Payer: Self-pay

## 2022-07-20 NOTE — Telephone Encounter (Signed)
LABS:     Latest Reference Range & Units 07/03/22 00:00  WBC   7.6 (E)  RBC 3.87 - 5.11  2.86 ! (E)  Hemoglobin 12.0 - 16.0  7.9 ! (E)  HCT 36 - 46  25 ! (E)  Platelets 150 - 400 K/uL 246 (E)  NEUT#   6.16 (E)  !: Data is abnormal (E): External lab result   ASSESSMENT & PLAN:  Assessment:  An 89 y.o. 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  Based upon her clinical breast exam today and recent mammogram , the patient remains disease-free.  She knows to continue taking her anastrozole on a daily basis to complete 5 total years of adjuvant endocrine therapy.  Unfortunately, she remains anemic from her carotid endarterectomy surgery from earlier this week to where a blood transfusion may need to be considered.  The patient will need to follow with her vascular surgeon in the forthcoming days to ensure no significant postsurgical complications exist.  From a breast cancer perspective, the patient is doing well.  I will see her back in 4 months for a repeat clinical breast exam. The patient understands all the plans discussed today and is in agreement with them.   Weston Settle, MD              Electronically signed by Weston Settle, MD at 07/03/2022  7:33 PM

## 2022-07-22 LAB — CBC
Hematocrit: 26.6 % — ABNORMAL LOW (ref 34.0–46.6)
Hemoglobin: 8 g/dL — ABNORMAL LOW (ref 11.1–15.9)
MCH: 27.3 pg (ref 26.6–33.0)
MCHC: 30.1 g/dL — ABNORMAL LOW (ref 31.5–35.7)
MCV: 91 fL (ref 79–97)
Platelets: 298 10*3/uL (ref 150–450)
RBC: 2.93 x10E6/uL — ABNORMAL LOW (ref 3.77–5.28)
RDW: 15.4 % (ref 11.7–15.4)
WBC: 8.2 10*3/uL (ref 3.4–10.8)

## 2022-09-10 NOTE — Progress Notes (Signed)
Cardiology Office Note:    Date:  09/11/2022   ID:  Marilyn Robinson, DOB August 29, 1933, MRN [id number]  PCP:  Hadley Pen, MD  Cardiologist:  Norman Herrlich, MD    Referring MD: Hadley Pen, MD    ASSESSMENT:    1. Paroxysmal atrial fibrillation (HCC)   2. Presence of Watchman left atrial appendage closure device   3. Stenosis of right carotid artery   4. H/O carotid endarterectomy   5. Hypertensive heart disease with heart failure (HCC)   6. Anemia, unspecified type   7. Mild CAD    PLAN:    In order of problems listed above:  Fortunately she has had both a Watchman device and carotid endarterectomy after having both embolic and large vessel strokes and should be optimally protected and she cannot take anticoagulant therapy with recurrent GI bleeds Continue low-dose aspirin she may require repeat iron infusions and/or transfusion future Well-controlled hypertension she will continue her current combination ARB hydralazine and carvedilol Continue statin with history of stroke and CAD Anemia is improved following transfusion if she experiences another drop in hemoglobin but I think IV iron to be Stable CAD on good medical therapy including intensity statin   Next appointment: 6 months   Medication Adjustments/Labs and Tests Ordered: Current medicines are reviewed at length with the patient today.  Concerns regarding medicines are outlined above.  No orders of the defined types were placed in this encounter.  No orders of the defined types were placed in this encounter.    History of Present Illness:    Marilyn Robinson is a 89 y.o. female with a hx of complex heart disease including CAD hypertensive heart disease with heart failure stage IV CKD atrial fibrillation with stroke and recurrent GI bleeding and subsequent watchman left atrial occlusion device penetrating aortic ulcer on 3 device CT scan history of breast cancer with surgical lumpectomy  anemia and carotid artery disease with right carotid endarterectomy [facility] June 2024 last seen 06/11/2022.  Recently has required transfusion of 2 units of packed cells for chronic anemia.  Compliance with diet, lifestyle and medications: Yes  She is made a good recovery from all of her interventions and feels markedly improved after transfusion I have told her if the issue arises again asked people to give her IV iron she had received 2 infusions prior to her vascular surgery at [facility] She is not having palpitation edema shortness of breath chest pain or syncope She was in atrial fibrillation on her last EKG and appears to be in atrial fibrillation on physical exam today  Hemoglobin 08/28/2022 10.3 platelets 268,000 BMP with a creatinine 1.08 GFR 50 cc potassium 3.7 and sodium 137.  EKG 06/25/2022 atrial fibrillation ventricular response 130 teen beats per minute non specific ST and T Past Medical History:  Diagnosis Date   Acute lacunar infarction (HCC) 10/19/2016   Ambulatory dysfunction 06/13/2019   Anemia 07/02/2016   At high risk for injury related to fall 10/05/2016   At risk for falls 01/07/2017   Benign paroxysmal vertigo 01/07/2017   Formatting of this note might be different from the original. patient still with fluid behind the ears that may be contributing to positional changes inducing vertigo. finished last dose of antibiotics today. will add nasal sprayto see if draining inner ears will improve sinus symptoms   Bilateral carotid artery stenosis 09/22/2015   BMI 31.0-31.9,adult 05/06/2015   Breast cancer (HCC)    Calculus of  gallbladder and bile duct without cholecystitis 01/07/2017   Candidiasis of vulva 05/22/2021   Chronic combined systolic and diastolic heart failure (HCC) 07/17/2014   Overview:  EF 56% 09/23/10   Chronic insomnia 04/14/2018   Closed fracture of lumbar vertebra (HCC) 07/10/2008   Formatting of this note might be different from  the original. Thought patient's knee pain may be referred from hip. xray of hip shows some arthritic changes, but incidentally, review of lumbar spine shows turning of the bones. believe patient's hip and knee pain may be referred from compression of nerve in back. will request mri, and orthopedic referral   Complete rupture of rotator cuff 01/07/2017   Formatting of this note might be different from the original. patient already has her appointment scheduled for ortho for follow up   Continuous leakage of urine 10/05/2016   DDD (degenerative disc disease), cervical 01/07/2017   Formatting of this note might be different from the original. w/ Pain in R. Arm due to foraminal impingement   Diabetes mellitus with complication (HCC)    Diabetes mellitus without complication (HCC)    Dislocation of right knee with lateral meniscus tear 03/15/2014   Ductal carcinoma in situ (DCIS) of left breast 05/06/2015   Environmental and seasonal allergies 10/05/2016   Flu vaccine need 10/05/2016   GERD (gastroesophageal reflux disease)    Gout 01/07/2017   Gouty arthropathy 01/07/2017   Formatting of this note might be different from the original. will check patient's uric acid level   Hospital discharge follow-up 11/09/2016   Hyperlipidemia 07/17/2014   Hypertension    Hypertensive heart disease with heart failure (HCC) 07/17/2014   Hypertensive urgency 09/22/2015   Hypo-osmolality and hyponatremia 05/29/2015   Formatting of this note might be different from the original. Will recheck patient's sodium level to see if it has returned to normal range --repeat labs today Formatting of this note might be different from the original. Formatting of this note might be different from the original. 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   Major depressive disorder with single episode, in full remission (HCC) 11/01/2020   Malaise and fatigue 06/13/2019   Mass of upper outer quadrant of left breast 04/21/2021   Mild CAD 07/17/2014   Overview:  50% RCA stenosis in 2005   Mixed dyslipidemia 08/25/2017   Need for vaccination against Streptococcus pneumoniae using pneumococcal conjugate vaccine 13 10/05/2016   Non morbid obesity 05/06/2015   Occlusion and stenosis of carotid artery 01/07/2017   Other specified acquired hypothyroidism 01/07/2017   Paroxysmal atrial fibrillation (HCC) 05/22/2021   Peripheral edema 04/30/2017   Personal history of transient ischemic attack (TIA), and cerebral infarction without residual deficits 10/23/2016   Preop cardiovascular exam 01/07/2017   Formatting of this note might be different from the original.  REviewed patient's personalized wellness plan. she is current with screenings.   Pyuria 09/22/2015   Rhinosinusitis 10/14/2018   S/P arthroscopy of knee 05/17/2014   Secondary cardiomyopathy (HCC) 01/07/2017   Formatting of this note might be different from the original. EF repeat in 10/2006 was 69%   Stage 3a chronic kidney disease (HCC) 04/29/2020   Stress due to illness of family member 10/23/2016  Stroke (HCC) 02/19/2021   Stroke-like episode 05/29/2015   Stroke-like symptoms 05/29/2015   Syncope and collapse 01/07/2017   Formatting of this note might be different from the original.  will have patient stop the hydralazine, stop the probenecide, and cut her furosemide in 1/2. Will have her check her blood pressures at home and see if her diastolic pressures will start to come back up.   Thoracic aortic atherosclerosis (HCC) 05/06/2021   TIA (transient ischemic attack)    Uterine prolapse 07/02/2016   Vitamin D deficiency 11/01/2020   Weakness 05/22/2021     Current Medications: Current Meds  Medication Sig   acetaminophen (TYLENOL) 325 MG tablet Take 650 mg by mouth every 4 (four) hours as needed for mild pain or moderate pain.   anastrozole (ARIMIDEX) 1 MG tablet Take 1 tablet (1 mg total) by mouth daily.   aspirin EC 81 MG tablet Take 81 mg by mouth daily. Swallow whole.   carvedilol (COREG) 25 MG tablet Take 25 mg by mouth 2 (two) times daily.   cyanocobalamin (,VITAMIN B-12,) 1000 MCG/ML injection Inject 1 mL into the muscle every 30 (thirty) days.   Ferrous Sulfate (IRON) 325 (65 Fe) MG TABS Take 1 tablet (325 mg total) by mouth daily.   furosemide (LASIX) 20 MG tablet Take 20 mg by mouth as needed for fluid or edema.   hydrALAZINE (APRESOLINE) 50 MG tablet Take 50 mg by mouth 3 (three) times daily.   magnesium oxide (MAG-OX) 400 (240 Mg) MG tablet Take 400 mg by mouth daily.   metFORMIN (GLUCOPHAGE-XR) 500 MG 24 hr tablet Take 500 mg by mouth daily with breakfast. 500 mg in the morning and 1,000 at night   mirtazapine (REMERON) 15 MG tablet Take 15 mg by mouth daily.    olmesartan (BENICAR) 40 MG tablet Take 1 tablet by mouth daily.   pantoprazole (PROTONIX) 40 MG tablet Take 40 mg by mouth daily.   rosuvastatin (CRESTOR) 10 MG tablet Take 10 mg by mouth daily.      EKGs/Labs/Other Studies Reviewed:    The following studies were reviewed today:  Cardiac Studies & Procedures     STRESS TESTS  MYOCARDIAL PERFUSION IMAGING 05/27/2021  Narrative   The study is normal. The study is low risk.   No ST deviation was noted.   Left ventricular function is normal. Nuclear stress EF: 58 %. The left ventricular ejection fraction is normal (55-65%). End diastolic cavity size is normal.   Prior study not available for comparison.   ECHOCARDIOGRAM  ECHOCARDIOGRAM COMPLETE 05/30/2015  Narrative *[place]* *[facility]* [address] [place], [place]  [address] [phone]  ------------------------------------------------------------------- Transthoracic Echocardiography  Patient:    Jaliyah, Munier MR #:       [id number] Study Date: 05/30/2015 Gender:     F Age:        17 Height:     167.6 cm Weight:     82.6 kg BSA:        1.98 m^2 Pt. Status: Room:       5W33C  PERFORMING   W. Viann Fish, MD [facility] SONOGRAPHER  Nolon Rod, RDCS ADMITTING    Perrin Smack REFERRING    Vedia Coffer Lesle Chris ATTENDING    Lyndal Pulley  cc:  ------------------------------------------------------------------- LV EF: 55% -   60%  ------------------------------------------------------------------- Indications:      CVA 436.  ------------------------------------------------------------------- History:   PMH:  Transient ischemic attack.  Risk factors:  Former tobacco use. Hypertension. Diabetes mellitus.  ------------------------------------------------------------------- Study Conclusions  - Left ventricle: The cavity size was normal. Wall thickness was increased in a pattern of moderate LVH. There was moderate concentric hypertrophy. Systolic function was normal. The estimated ejection fraction was in the range of 55% to 60%. Wall motion was normal; there were no regional wall motion abnormalities. - Mitral valve: Mildly calcified annulus. There was mild regurgitation. - Left atrium: The atrium was mildly dilated.  Impressions:  - No cardiac source of emboli was indentified.  Transthoracic echocardiography.  M-mode, complete 2D, spectral Doppler, and color Doppler.  Birthdate:  Patient birthdate: 1933/05/23.  Age:  Patient is 89 yr old.  Sex:  Gender: female. BMI: 29.4 kg/m^2.  Blood pressure:     155/51  Patient status: Inpatient.  Study date:  Study date: 05/30/2015. Study time: 12:03 PM.  Location:   Bedside.  -------------------------------------------------------------------  ------------------------------------------------------------------- Left ventricle:  The cavity size was normal. Wall thickness was increased in a pattern of moderate LVH. There was moderate concentric hypertrophy. Systolic function was normal. The estimated ejection fraction was in the range of 55% to 60%. Wall motion was normal; there were no regional wall motion abnormalities.  ------------------------------------------------------------------- Aortic valve:   Trileaflet; normal thickness, mildly calcified leaflets. Mobility was not restricted.  Doppler:  Transvalvular velocity was within the normal range. There was no stenosis. There was no regurgitation.  ------------------------------------------------------------------- Aorta:  Aortic root: The aortic root was normal in size.  ------------------------------------------------------------------- Mitral valve:   Mildly calcified annulus. Mobility was not restricted.  Doppler:  Transvalvular velocity was within the normal range. There was no evidence for stenosis. There was mild regurgitation.    Peak gradient (D): 2 mm Hg.  ------------------------------------------------------------------- Left atrium:  The atrium was mildly dilated.  ------------------------------------------------------------------- Right ventricle:  The cavity size was normal. Wall thickness was normal. Systolic function was normal.  ------------------------------------------------------------------- Pulmonic valve:   Not well visualized.  Doppler:  Transvalvular velocity was within the normal range. There was no evidence for stenosis.  ------------------------------------------------------------------- Tricuspid valve:   Structurally normal valve.    Doppler: Transvalvular velocity was within the normal range. There was  mild regurgitation.  ------------------------------------------------------------------- Pulmonary artery:   The main pulmonary artery was normal-sized. Systolic pressure was within the normal range.  ------------------------------------------------------------------- Right atrium:  The atrium was normal in size.  ------------------------------------------------------------------- Pericardium:  There was no pericardial effusion.  ------------------------------------------------------------------- Systemic veins: Inferior vena cava: The vessel was normal in size.  ------------------------------------------------------------------- Measurements  Left ventricle                           Value        Reference LV ID, ED, PLAX chordal        (L)       42.5  mm     43 - 52 LV ID, ES, PLAX chordal                  30.7  mm     23 - 38 LV fx shortening, PLAX chordal (L)       28    %      >=29 LV PW thickness, ED                      12.2  mm     --------- IVS/LV PW ratio, ED  1.23         <=1.3 LV e&', lateral                           6.74  cm/s   --------- LV E/e&', lateral                         11.16        --------- LV e&', medial                            5.66  cm/s   --------- LV E/e&', medial                          13.29        --------- LV e&', average                           6.2   cm/s   --------- LV E/e&', average                         12.13        ---------  Ventricular septum                       Value        Reference IVS thickness, ED                        15    mm     ---------  LVOT                                     Value        Reference LVOT ID, S                               19    mm     --------- LVOT area                                2.84  cm^2   ---------  Aorta                                    Value        Reference Aortic root ID, ED                       32    mm     ---------  Left atrium                               Value        Reference LA ID, A-P, ES                           36    mm     --------- LA ID/bsa, A-P  1.81  cm/m^2 <=2.2 LA volume, S                             46.2  ml     --------- LA volume/bsa, S                         23.3  ml/m^2 --------- LA volume, ES, 1-p A4C                   31.9  ml     --------- LA volume/bsa, ES, 1-p A4C               16.1  ml/m^2 --------- LA volume, ES, 1-p A2C                   62.9  ml     --------- LA volume/bsa, ES, 1-p A2C               31.7  ml/m^2 ---------  Mitral valve                             Value        Reference Mitral E-wave peak velocity              75.2  cm/s   --------- Mitral A-wave peak velocity              71.3  cm/s   --------- Mitral deceleration time       (L)       148   ms     150 - 230 Mitral peak gradient, D                  2     mm Hg  --------- Mitral E/A ratio, peak                   1.1          ---------  Right ventricle                          Value        Reference RV s&', lateral, S                        11.9  cm/s   ---------  Legend: (L)  and  (H)  mark values outside specified reference range.  ------------------------------------------------------------------- Prepared and Electronically Authenticated by  Georga Hacking, MD [facility] 2017-05-18T13:30:37    MONITORS  CARDIAC EVENT MONITOR 02/21/2022               Recent Labs: 12/30/2021: NT-Pro BNP 3,425 06/11/2022: BUN 26; Creatinine, Ser 1.45; Potassium 4.5; Sodium 136 07/21/2022: Hemoglobin 8.0; Platelets 298  Recent Lipid Panel    Component Value Date/Time   CHOL 119 08/19/2016 1042   TRIG 107 08/19/2016 1042   HDL 41 08/19/2016 1042   CHOLHDL 3.3 05/29/2015 1549   VLDL 22 05/29/2015 1549   LDLCALC 57 08/19/2016 1042    Physical Exam:    VS:  BP 136/86 (BP Location: Left Arm, Patient Position: Sitting, Cuff Size: Normal)   Pulse 80   Ht 5\' 6"  (1.676 m)   Wt 151 lb 3.2 oz (68.6 kg)   SpO2 99%   BMI 24.40  kg/m  Wt Readings from Last 3 Encounters:  09/11/22 151 lb 3.2 oz (68.6 kg)  07/03/22 149 lb (67.6 kg)  06/11/22 149 lb 12.8 oz (67.9 kg)     GEN: She does look frail well nourished, well developed in no acute distress HEENT: Normal NECK: No JVD; No carotid bruits LYMPHATICS: No lymphadenopathy CARDIAC: Irregular rate and rhythm variable first heart sound  RESPIRATORY:  Clear to auscultation without rales, wheezing or rhonchi  ABDOMEN: Soft, non-tender, non-distended MUSCULOSKELETAL:  No edema; No deformity  SKIN: Warm and dry NEUROLOGIC:  Alert and oriented x 3 PSYCHIATRIC:  Normal affect    Signed, Norman Herrlich, MD  09/11/2022 2:29 PM    [facility]

## 2022-09-11 ENCOUNTER — Ambulatory Visit: Payer: PPO | Admitting: Cardiology

## 2022-09-11 ENCOUNTER — Encounter: Payer: Self-pay | Admitting: Cardiology

## 2022-09-11 VITALS — BP 136/86 | HR 80 | Ht 66.0 in | Wt 151.2 lb

## 2022-09-11 DIAGNOSIS — Z9889 Other specified postprocedural states: Secondary | ICD-10-CM | POA: Diagnosis not present

## 2022-09-11 DIAGNOSIS — D649 Anemia, unspecified: Secondary | ICD-10-CM

## 2022-09-11 DIAGNOSIS — I480 Paroxysmal atrial fibrillation: Secondary | ICD-10-CM | POA: Diagnosis not present

## 2022-09-11 DIAGNOSIS — Z95818 Presence of other cardiac implants and grafts: Secondary | ICD-10-CM | POA: Diagnosis not present

## 2022-09-11 DIAGNOSIS — I6521 Occlusion and stenosis of right carotid artery: Secondary | ICD-10-CM | POA: Diagnosis not present

## 2022-09-11 DIAGNOSIS — I110 Hypertensive heart disease with heart failure: Secondary | ICD-10-CM

## 2022-09-11 DIAGNOSIS — I2510 Atherosclerotic heart disease of native coronary artery without angina pectoris: Secondary | ICD-10-CM

## 2022-09-11 NOTE — Patient Instructions (Signed)

## 2022-09-27 IMAGING — CT CT ANGIO CHEST-ABD-PELV FOR DISSECTION W/ AND WO/W CM
2 of 10 series · 12 of 46 positions shown, 14 images · non-contrast
Comparison: CTA coronary, 04/04/2021 CT lumbar spine, 01/07/2012

CLINICAL DATA: Penetrating atherosclerotic ulcer of the aorta
incidentally identified by prior coronary CTA

EXAM:
CT ANGIOGRAPHY CHEST, ABDOMEN AND PELVIS
TECHNIQUE: Non-contrast CT of the chest was initially obtained.

[Series 7: dissection 3.0 bf37 2 · axial · 0.79mm/px · z∈[-626,-80]mm · 9 of 214 slices shown, 11 images]
[im 16/214  soft-tissue]
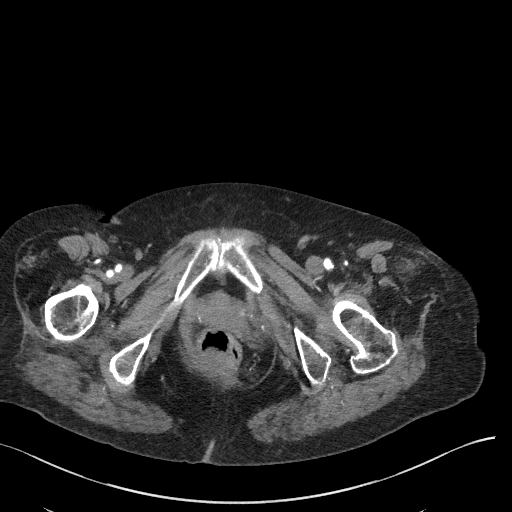
[im 16/214  bone]
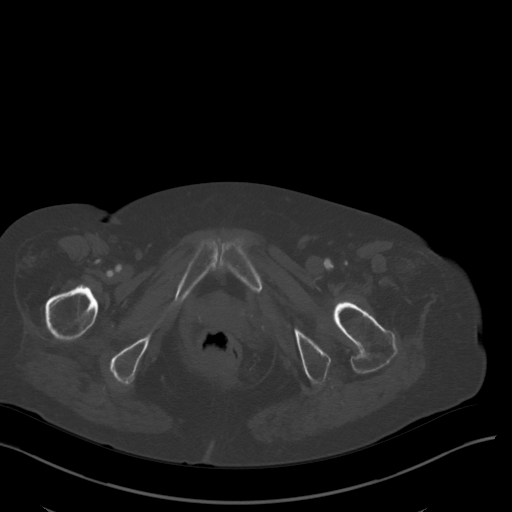
[im 46/214  soft-tissue]
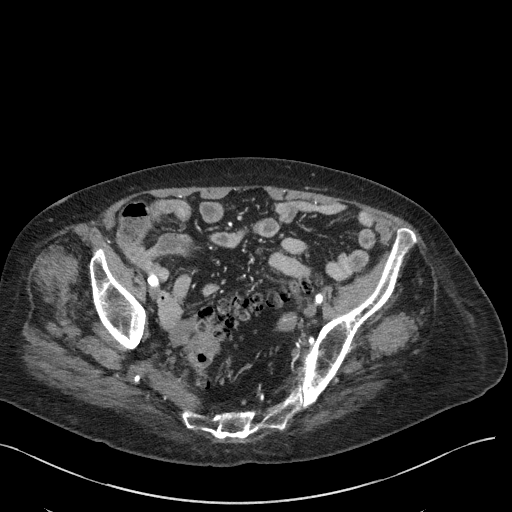
[im 61/214  soft-tissue]
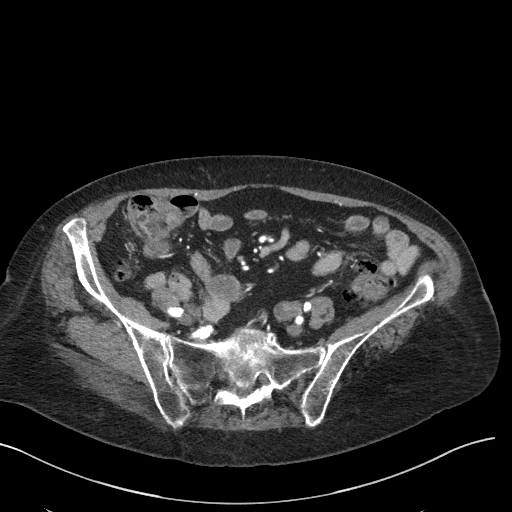
[im 92/214  soft-tissue]
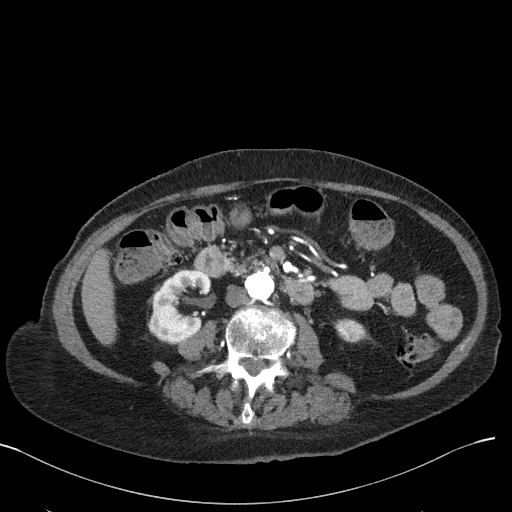
[im 107/214  soft-tissue]
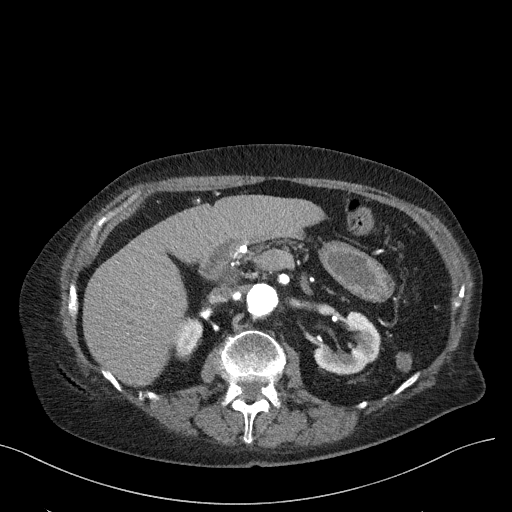
[im 122/214  soft-tissue]
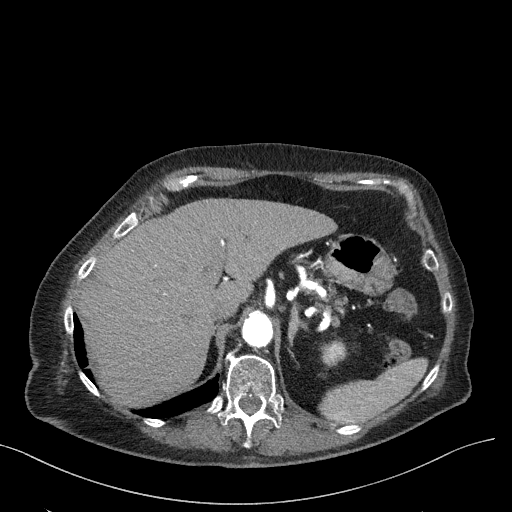
[im 153/214  soft-tissue]
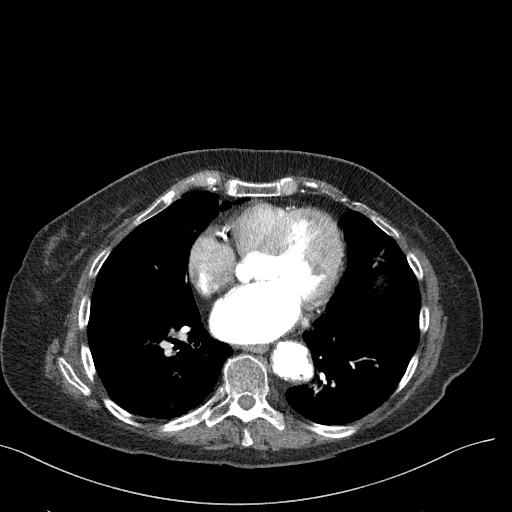
[im 168/214  soft-tissue]
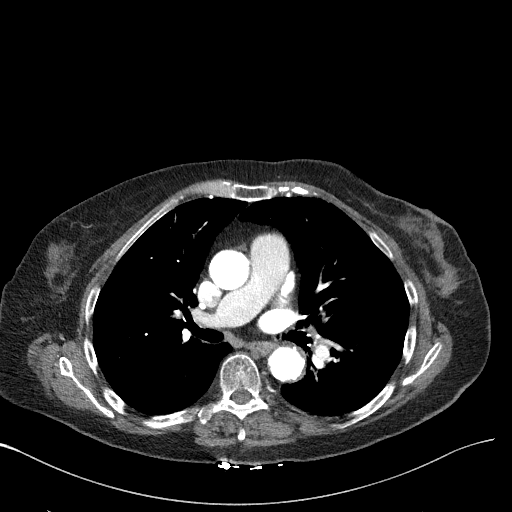
[im 198/214  soft-tissue]
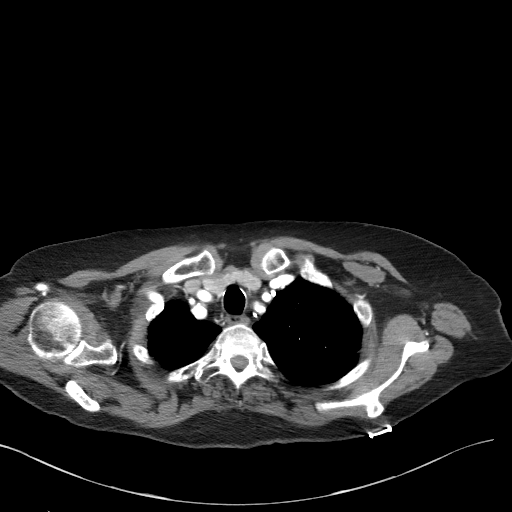
[im 198/214  bone]
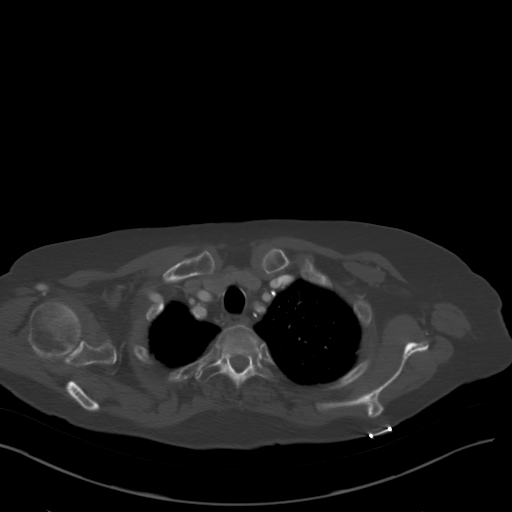

[Series 10: coronals · coronal · 0.72mm/px · 3 of 119 slices shown]
[im 30/119  soft-tissue]
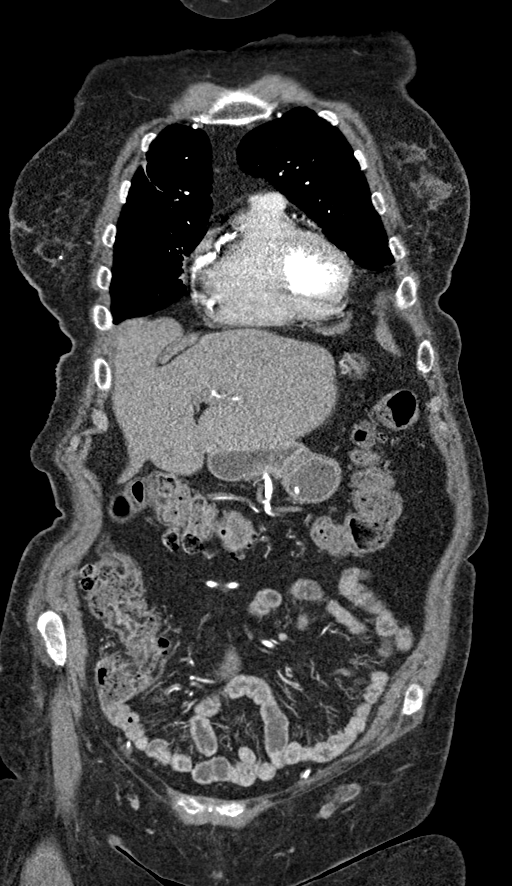
[im 60/119  soft-tissue]
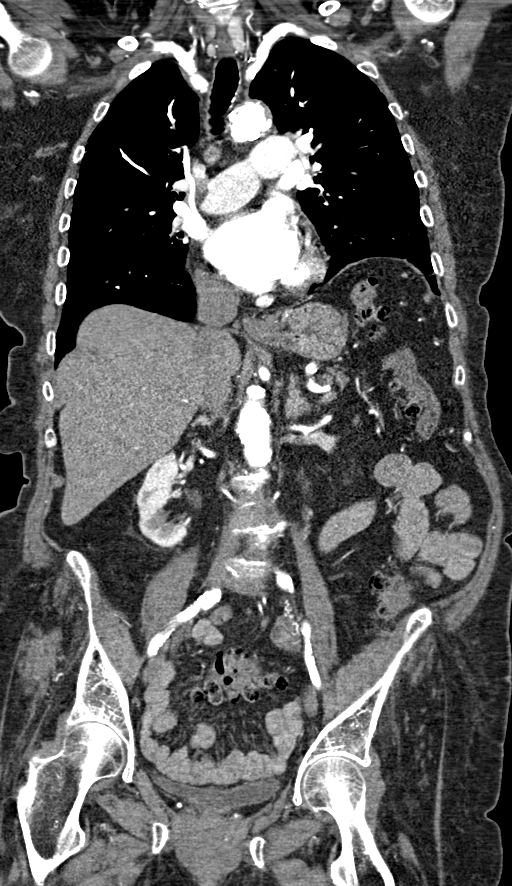
[im 89/119  soft-tissue]
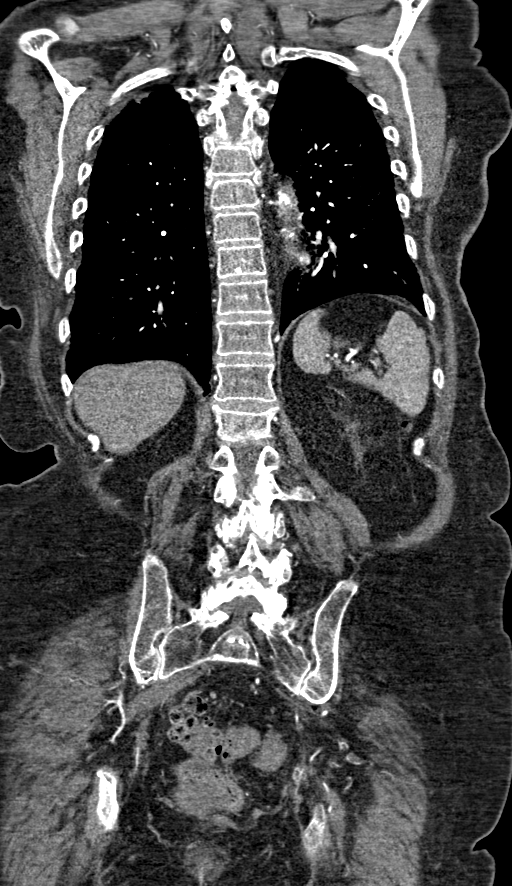

[12 of 46 positions shown; findings below may reference images not displayed]

Multidetector CT imaging through the chest, abdomen and pelvis was
performed using the standard protocol during bolus administration of
intravenous contrast. Multiplanar reconstructed images and MIPs were
obtained and reviewed to evaluate the vascular anatomy.

RADIATION DOSE REDUCTION: This exam was performed according to the
departmental dose-optimization program which includes automated
exposure control, adjustment of the mA and/or kV according to
patient size and/or use of iterative reconstruction technique.

CONTRAST:  80mL OMNIPAQUE IOHEXOL 350 MG/ML SOLN
FINDINGS: CTA CHEST FINDINGS

VASCULAR

Aorta: Satisfactory opacification of the aorta. Severe mixed
calcific atherosclerosis. There is a focal penetrating
atherosclerotic ulceration of the posterior left aspect of the mid
descending thoracic aorta, ulceration breath and depth approximately
1.6 x 1.1 cm, maximum caliber of the vessel in total at this level
3.9 x 3.0 cm (series 7, image 63). Otherwise normal contour and
caliber of the thoracic aorta. No evidence of aneurysm, dissection,
or other acute aortic pathology.

Cardiovascular: No evidence of pulmonary embolism on limited
non-tailored examination. Normal heart size. Three-vessel coronary
artery calcifications. No pericardial effusion.

Review of the MIP images confirms the above findings.

NON VASCULAR

Mediastinum/Nodes: No enlarged mediastinal, hilar, or axillary lymph
nodes. Thyroid gland, trachea, and esophagus demonstrate no
significant findings.

Lungs/Pleura: Moderate centrilobular emphysema. Minimal dependent
bibasilar scarring and or partial atelectasis. No pleural effusion
or pneumothorax.

Musculoskeletal: No chest wall abnormality. No acute osseous
findings.

Review of the MIP images confirms the above findings.

CTA ABDOMEN AND PELVIS FINDINGS

VASCULAR

Severe mixed calcific atherosclerosis. Normal contour and caliber of
the abdominal aorta. No evidence of aneurysm, dissection, or other
acute aortic pathology. Standard branching pattern of the abdominal
aorta with solitary bilateral renal arteries. Atherosclerosis at the
branch vessel ostia without high-grade stenosis.

Review of the MIP images confirms the above findings.

NON-VASCULAR

Hepatobiliary: No solid liver abnormality is seen. No gallstones,
gallbladder wall thickening, or biliary dilatation.

Pancreas: Unremarkable. No pancreatic ductal dilatation or
surrounding inflammatory changes.

Spleen: Normal in size without significant abnormality.

Adrenals/Urinary Tract: Adrenal glands are unremarkable. Kidneys are
normal, without renal calculi, solid lesion, or hydronephrosis.
Bladder is unremarkable.

Stomach/Bowel: Stomach is within normal limits. Appendix appears
normal. No evidence of bowel wall thickening, distention, or
inflammatory changes. Descending and sigmoid diverticulosis.

Lymphatic: No enlarged abdominal or pelvic lymph nodes.

Reproductive: No mass or other significant abnormality.

Other: No abdominal wall hernia or abnormality. No ascites.

Musculoskeletal: Superior endplate wedge deformities of the L1 and
L3 vertebral bodies, unchanged compared to prior examinations.
IMPRESSION: 1. Severe mixed calcific atherosclerosis of the thoracic and
abdominal aorta. Focal penetrating atherosclerotic ulceration of the
posterior left aspect of the mid descending thoracic aorta as seen
on recent prior CT coronary angiogram, ulceration breath and depth
approximately 1.6 x 1.1 cm, maximum caliber of the vessel in total
at this level 3.9 x 3.0 cm. Recommend vascular specialist referral
if not already obtained for consideration of treatment and
surveillance. This recommendation follows 2090
ACCF/AHA/AATS/ACR/ASA/SCA/NOCHE/SOSYA/DUDUJ/DANII Guidelines for the
Diagnosis and Management of Patients With Thoracic Aortic Disease.
Circulation. 2090; 121: E266-e369. Aortic aneurysm NOS (IJMAZ-F4G.P)
2. No other evidence of thoracic or abdominal aortic aneurysm,
dissection, or other acute aortic pathology. The remaining portions
of the thoracic and abdominal aorta are normal in caliber.
3. Coronary artery disease.
4. Emphysema.

Aortic Atherosclerosis (IJMAZ-GO7.7) and Emphysema (IJMAZ-XT6.N).

## 2022-11-01 NOTE — Progress Notes (Signed)
[facility]  [address] [place],  [place]  [address] [phone]  Clinic Day:  11/02/2022  Referring physician: Hadley Pen, MD  HISTORY OF PRESENT ILLNESS:  The patient is an 89 y.o. 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  She currently takes anastrozole for her adjuvant endocrine therapy.  She comes in today for routine follow-up.  Since her last visit, the patient has been doing okay.  She denies having any particular changes in her breasts which concern her for disease recurrence.  However, she complains of increased fatigue.  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  The patient elected not to undergo adjuvant breast radiation.  She briefly took tamoxifen, but discontinued it due to it causing numerous side effects, including undesirable weight gain and joint discomfort.    VITALS:  Blood pressure 137/73, pulse 90, temperature 97.8 F (36.6 C), resp. rate 14, height 5\' 6"  (1.676 m), weight 153 lb (69.4 kg), SpO2 95%.  Wt Readings from Last 3 Encounters:  11/16/22 153 lb (69.4 kg)  11/09/22 148 lb 6.4 oz (67.3 kg)  11/02/22 153 lb (69.4 kg)    Body mass index is 24.69 kg/m.  Performance status (ECOG): 1 - Symptomatic but completely ambulatory  PHYSICAL EXAM:  Physical Exam Constitutional:      Appearance: Normal appearance.  HENT:     Mouth/Throat:     Pharynx: Oropharynx is clear. No oropharyngeal exudate.  Cardiovascular:     Rate and Rhythm: Normal rate and regular rhythm.     Heart sounds: No murmur heard.    No friction rub. No gallop.  Pulmonary:     Breath sounds: Normal breath sounds.  Chest:  Breasts:    Right: No swelling, bleeding, inverted nipple, mass, nipple discharge or skin change.     Left: No swelling, bleeding, inverted nipple, mass, nipple discharge or skin change.   Abdominal:     General: Bowel sounds are normal. There is no distension.     Palpations: Abdomen is soft. There is no mass.     Tenderness: There is no abdominal tenderness.  Musculoskeletal:        General: No tenderness.     Cervical back: Normal range of motion and neck supple.     Right lower leg: No edema.     Left lower leg: No edema.  Lymphadenopathy:     Cervical: No cervical adenopathy.     Right cervical: No superficial, deep or posterior cervical adenopathy.    Left cervical: No superficial, deep or posterior cervical adenopathy.     Upper Body:     Right upper body: No supraclavicular or axillary adenopathy.     Left upper body: No supraclavicular or axillary adenopathy.     Lower Body: No right inguinal adenopathy. No left inguinal adenopathy.  Skin:    Coloration: Skin is not jaundiced.     Findings: No lesion or rash.  Neurological:     General: No focal deficit present.     Mental Status: She is alert and oriented to person, place, and time. Mental status is at baseline.  Psychiatric:        Mood and Affect: Mood normal.        Behavior: Behavior normal.        Thought Content: Thought content normal.  Judgment: Judgment normal.   LABS:    Latest Reference Range & Units 11/02/22 11:45  Iron 28 - 170 ug/dL 28  UIBC ug/dL 161  TIBC 096 - 045 ug/dL 409 (H)  Saturation Ratios 10.4 - 31.8 % 5 (L)  Ferritin 11 - 307 ng/mL 9 (L)  Folate >5.9 ng/mL 10.9  Vitamin B12 180 - 914 pg/mL 351  WBC 4.0 - 10.5 K/uL 4.9  RBC 3.87 - 5.11 MIL/uL 2.89 (L)  Hemoglobin 12.0 - 15.0 g/dL 7.5 (L)  HCT 81.1 - 91.4 % 25.2 (L)  MCV 80.0 - 100.0 fL 87.2  MCH 26.0 - 34.0 pg 26.0  MCHC 30.0 - 36.0 g/dL 78.2 (L)  RDW 95.6 - 21.3 % 16.5 (H)  Platelets 150 - 400 K/uL 207  nRBC 0.0 - 0.2 % 0.0  (H): Data is abnormally high (L): Data is abnormally low  ASSESSMENT & PLAN:  Assessment:  An 89 y.o. 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  Based upon her clinical breast exam today, the patient remains disease-free.  She knows to continue taking her anastrozole on a daily basis to complete 5 total years of adjuvant endocrine therapy.  On another note, her labs today show she is iron deficient for which I will arrange for her to receive IV iron in the forthcoming weeks to replenish her iron stores and improve her hemoglobin.  From a breast cancer perspective, the patient is doing well.  I will see her back in 4 months for a repeat clinical breast exam, as well as to reassess her iron deficiency anemia. The patient understands all the plans discussed today and is in agreement with them.  Cristal Howatt Kirby Funk, MD

## 2022-11-02 ENCOUNTER — Other Ambulatory Visit: Payer: Self-pay | Admitting: Oncology

## 2022-11-02 ENCOUNTER — Telehealth: Payer: Self-pay

## 2022-11-02 ENCOUNTER — Inpatient Hospital Stay: Payer: PPO

## 2022-11-02 ENCOUNTER — Inpatient Hospital Stay: Payer: PPO | Attending: Oncology | Admitting: Oncology

## 2022-11-02 VITALS — BP 137/73 | HR 90 | Temp 97.8°F | Resp 14 | Ht 66.0 in | Wt 153.0 lb

## 2022-11-02 DIAGNOSIS — D508 Other iron deficiency anemias: Secondary | ICD-10-CM

## 2022-11-02 DIAGNOSIS — D649 Anemia, unspecified: Secondary | ICD-10-CM | POA: Insufficient documentation

## 2022-11-02 DIAGNOSIS — Z170 Estrogen receptor positive status [ER+]: Secondary | ICD-10-CM

## 2022-11-02 LAB — CBC WITH DIFFERENTIAL (CANCER CENTER ONLY)
Abs Immature Granulocytes: 0.01 10*3/uL (ref 0.00–0.07)
Basophils Absolute: 0 10*3/uL (ref 0.0–0.1)
Basophils Relative: 1 %
Eosinophils Absolute: 0.1 10*3/uL (ref 0.0–0.5)
Eosinophils Relative: 2 %
HCT: 25.2 % — ABNORMAL LOW (ref 36.0–46.0)
Hemoglobin: 7.5 g/dL — ABNORMAL LOW (ref 12.0–15.0)
Immature Granulocytes: 0 %
Lymphocytes Relative: 10 %
Lymphs Abs: 0.5 10*3/uL — ABNORMAL LOW (ref 0.7–4.0)
MCH: 26 pg (ref 26.0–34.0)
MCHC: 29.8 g/dL — ABNORMAL LOW (ref 30.0–36.0)
MCV: 87.2 fL (ref 80.0–100.0)
Monocytes Absolute: 0.4 10*3/uL (ref 0.1–1.0)
Monocytes Relative: 7 %
Neutro Abs: 3.9 10*3/uL (ref 1.7–7.7)
Neutrophils Relative %: 80 %
Platelet Count: 207 10*3/uL (ref 150–400)
RBC: 2.89 MIL/uL — ABNORMAL LOW (ref 3.87–5.11)
RDW: 16.5 % — ABNORMAL HIGH (ref 11.5–15.5)
WBC Count: 4.9 10*3/uL (ref 4.0–10.5)
nRBC: 0 % (ref 0.0–0.2)

## 2022-11-02 LAB — IRON AND TIBC
Iron: 28 ug/dL (ref 28–170)
Saturation Ratios: 5 % — ABNORMAL LOW (ref 10.4–31.8)
TIBC: 533 ug/dL — ABNORMAL HIGH (ref 250–450)
UIBC: 505 ug/dL

## 2022-11-02 LAB — FERRITIN: Ferritin: 9 ng/mL — ABNORMAL LOW (ref 11–307)

## 2022-11-02 LAB — VITAMIN B12: Vitamin B-12: 351 pg/mL (ref 180–914)

## 2022-11-02 LAB — FOLATE: Folate: 10.9 ng/mL (ref >5.9)

## 2022-11-02 NOTE — Telephone Encounter (Signed)
Dr Melvyn Neth asked me to call pt, "tell her she is severely iron deficient and she needs IV iron".

## 2022-11-09 ENCOUNTER — Inpatient Hospital Stay: Payer: PPO

## 2022-11-09 VITALS — BP 106/49 | HR 84 | Temp 97.5°F | Resp 18 | Ht 66.0 in | Wt 148.4 lb

## 2022-11-09 DIAGNOSIS — D508 Other iron deficiency anemias: Secondary | ICD-10-CM

## 2022-11-09 DIAGNOSIS — D649 Anemia, unspecified: Secondary | ICD-10-CM | POA: Diagnosis not present

## 2022-11-09 MED ORDER — SODIUM CHLORIDE 0.9 % IV SOLN: INTRAVENOUS | Status: DC | PRN

## 2022-11-09 MED ORDER — FERUMOXYTOL INJECTION 510 MG/17 ML
510.0000 mg | Freq: Once | INTRAVENOUS | Status: AC
  Administered 2022-11-09: 510 mg via INTRAVENOUS
  Filled 2022-11-09: qty 510

## 2022-11-09 NOTE — Patient Instructions (Signed)
 Ferumoxytol Injection What is this medication? FERUMOXYTOL (FER ue MOX i tol) treats low levels of iron in your body (iron deficiency anemia). Iron is a mineral that plays an important role in making red blood cells, which carry oxygen from your lungs to the rest of your body. This medicine may be used for other purposes; ask your health care provider or pharmacist if you have questions. COMMON BRAND NAME(S): Feraheme What should I tell my care team before I take this medication? They need to know if you have any of these conditions: Anemia not caused by low iron levels High levels of iron in the blood Magnetic resonance imaging (MRI) test scheduled An unusual or allergic reaction to iron, other medications, foods, dyes, or preservatives Pregnant or trying to get pregnant Breastfeeding How should I use this medication? This medication is injected into a vein. It is given by your care team in a hospital or clinic setting. Talk to your care team the use of this medication in children. Special care may be needed. Overdosage: If you think you have taken too much of this medicine contact a poison control center or emergency room at once. NOTE: This medicine is only for you. Do not share this medicine with others. What if I miss a dose? It is important not to miss your dose. Call your care team if you are unable to keep an appointment. What may interact with this medication? Other iron products This list may not describe all possible interactions. Give your health care provider a list of all the medicines, herbs, non-prescription drugs, or dietary supplements you use. Also tell them if you smoke, drink alcohol, or use illegal drugs. Some items may interact with your medicine. What should I watch for while using this medication? Visit your care team regularly. Tell your care team if your symptoms do not start to get better or if they get worse. You may need blood work done while you are taking this  medication. You may need to follow a special diet. Talk to your care team. Foods that contain iron include: whole grains/cereals, dried fruits, beans, or peas, leafy green vegetables, and organ meats (liver, kidney). What side effects may I notice from receiving this medication? Side effects that you should report to your care team as soon as possible: Allergic reactions--skin rash, itching, hives, swelling of the face, lips, tongue, or throat Low blood pressure--dizziness, feeling faint or lightheaded, blurry vision Shortness of breath Side effects that usually do not require medical attention (report to your care team if they continue or are bothersome): Flushing Headache Joint pain Muscle pain Nausea Pain, redness, or irritation at injection site This list may not describe all possible side effects. Call your doctor for medical advice about side effects. You may report side effects to FDA at 1-800-FDA-1088. Where should I keep my medication? This medication is given in a hospital or clinic. It will not be stored at home. NOTE: This sheet is a summary. It may not cover all possible information. If you have questions about this medicine, talk to your doctor, pharmacist, or health care provider.  2024 Elsevier/Gold Standard (2022-06-05 00:00:00)

## 2022-11-10 ENCOUNTER — Ambulatory Visit: Payer: PPO | Admitting: Neurology

## 2022-11-16 ENCOUNTER — Inpatient Hospital Stay: Payer: PPO | Attending: Oncology

## 2022-11-16 DIAGNOSIS — D649 Anemia, unspecified: Secondary | ICD-10-CM | POA: Diagnosis present

## 2022-11-16 DIAGNOSIS — D508 Other iron deficiency anemias: Secondary | ICD-10-CM

## 2022-11-16 MED ORDER — SODIUM CHLORIDE 0.9 % IV SOLN: INTRAVENOUS | Status: DC | PRN

## 2022-11-16 MED ORDER — SODIUM CHLORIDE 0.9 % IV SOLN
510.0000 mg | Freq: Once | INTRAVENOUS | Status: AC
  Administered 2022-11-16: 510 mg via INTRAVENOUS
  Filled 2022-11-16: qty 510

## 2022-11-16 NOTE — Patient Instructions (Signed)
 Ferumoxytol Injection What is this medication? FERUMOXYTOL (FER ue MOX i tol) treats low levels of iron in your body (iron deficiency anemia). Iron is a mineral that plays an important role in making red blood cells, which carry oxygen from your lungs to the rest of your body. This medicine may be used for other purposes; ask your health care provider or pharmacist if you have questions. COMMON BRAND NAME(S): Feraheme What should I tell my care team before I take this medication? They need to know if you have any of these conditions: Anemia not caused by low iron levels High levels of iron in the blood Magnetic resonance imaging (MRI) test scheduled An unusual or allergic reaction to iron, other medications, foods, dyes, or preservatives Pregnant or trying to get pregnant Breastfeeding How should I use this medication? This medication is injected into a vein. It is given by your care team in a hospital or clinic setting. Talk to your care team the use of this medication in children. Special care may be needed. Overdosage: If you think you have taken too much of this medicine contact a poison control center or emergency room at once. NOTE: This medicine is only for you. Do not share this medicine with others. What if I miss a dose? It is important not to miss your dose. Call your care team if you are unable to keep an appointment. What may interact with this medication? Other iron products This list may not describe all possible interactions. Give your health care provider a list of all the medicines, herbs, non-prescription drugs, or dietary supplements you use. Also tell them if you smoke, drink alcohol, or use illegal drugs. Some items may interact with your medicine. What should I watch for while using this medication? Visit your care team regularly. Tell your care team if your symptoms do not start to get better or if they get worse. You may need blood work done while you are taking this  medication. You may need to follow a special diet. Talk to your care team. Foods that contain iron include: whole grains/cereals, dried fruits, beans, or peas, leafy green vegetables, and organ meats (liver, kidney). What side effects may I notice from receiving this medication? Side effects that you should report to your care team as soon as possible: Allergic reactions--skin rash, itching, hives, swelling of the face, lips, tongue, or throat Low blood pressure--dizziness, feeling faint or lightheaded, blurry vision Shortness of breath Side effects that usually do not require medical attention (report to your care team if they continue or are bothersome): Flushing Headache Joint pain Muscle pain Nausea Pain, redness, or irritation at injection site This list may not describe all possible side effects. Call your doctor for medical advice about side effects. You may report side effects to FDA at 1-800-FDA-1088. Where should I keep my medication? This medication is given in a hospital or clinic. It will not be stored at home. NOTE: This sheet is a summary. It may not cover all possible information. If you have questions about this medicine, talk to your doctor, pharmacist, or health care provider.  2024 Elsevier/Gold Standard (2022-06-05 00:00:00)

## 2022-11-23 IMAGING — CT CT ANGIO CHEST
3 of 6 series · 16 of 36 positions shown · IV contrast (OMNI 350)
Comparison: 06/10/2021

CLINICAL DATA: Follow-up focal aneurysmal dilatation of the
ascending thoracic aorta with penetrating atherosclerotic
ulceration.

EXAM:
CT ANGIOGRAPHY CHEST WITH CONTRAST
TECHNIQUE: Multidetector CT imaging of the chest was performed using the
standard protocol during bolus administration of intravenous
contrast. Multiplanar CT image reconstructions and MIPs were
obtained to evaluate the vascular anatomy.

[Series 6: ax arterial · axial · arterial · 0.78mm/px · z∈[+1259,+1519]mm · 12 of 154 slices shown]
[im 12/154  lung]
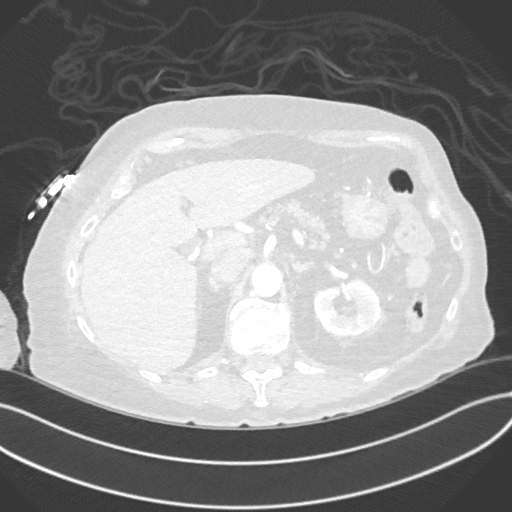
[im 24/154  mediastinal]
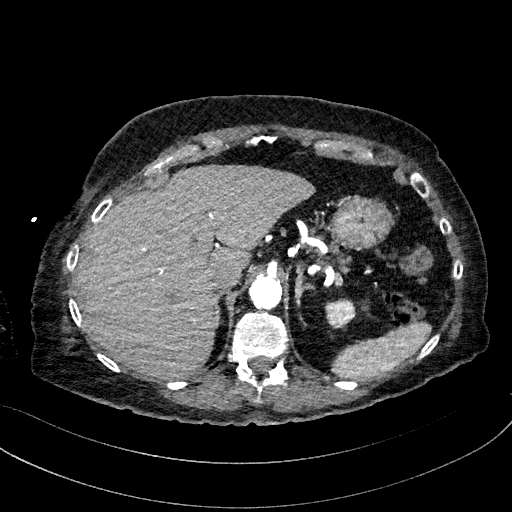
[im 36/154  lung]
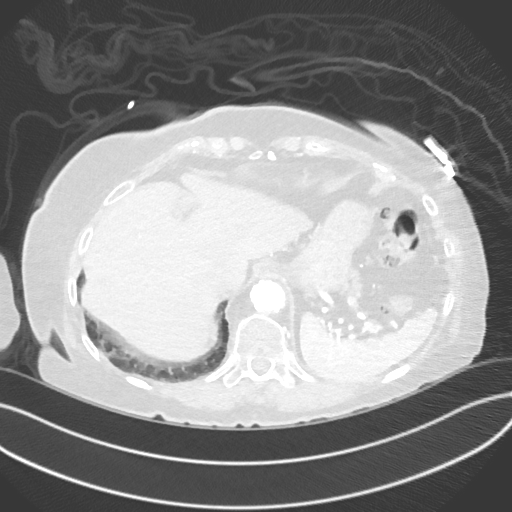
[im 48/154  mediastinal]
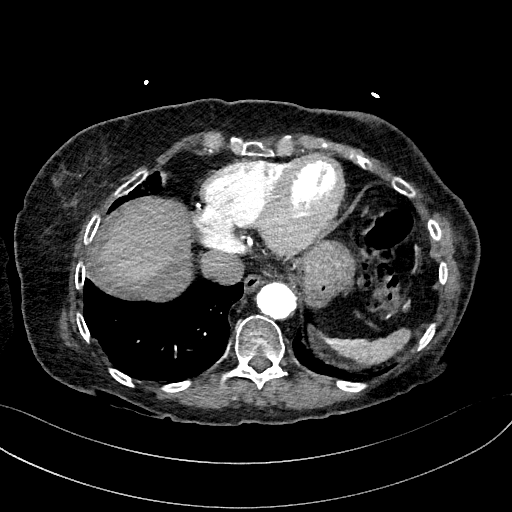
[im 59/154  lung]
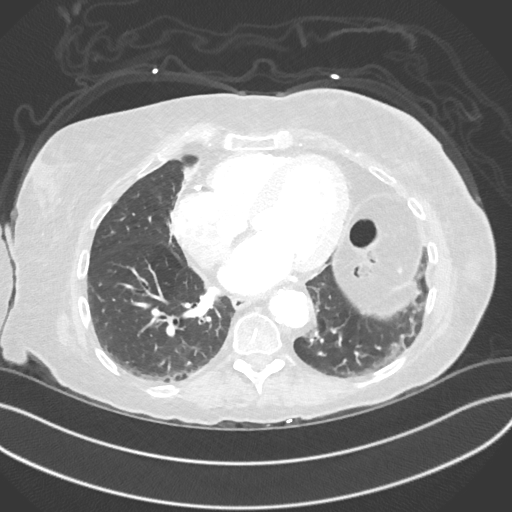
[im 71/154  mediastinal]
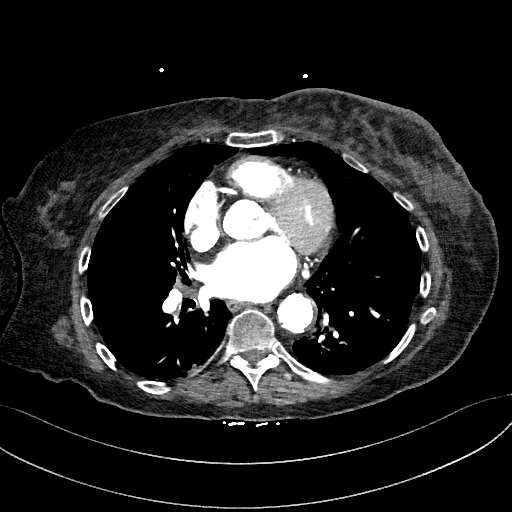
[im 83/154  lung]
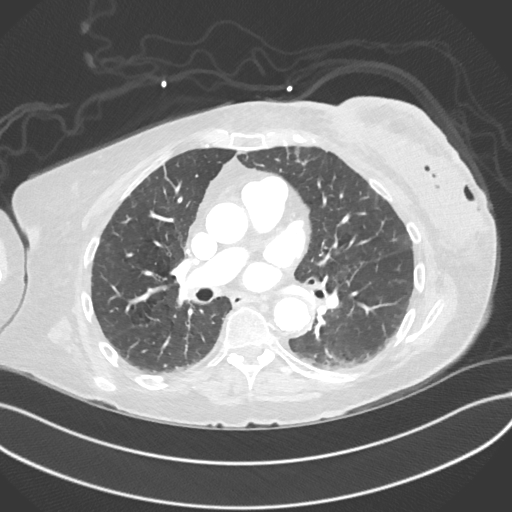
[im 95/154  mediastinal]
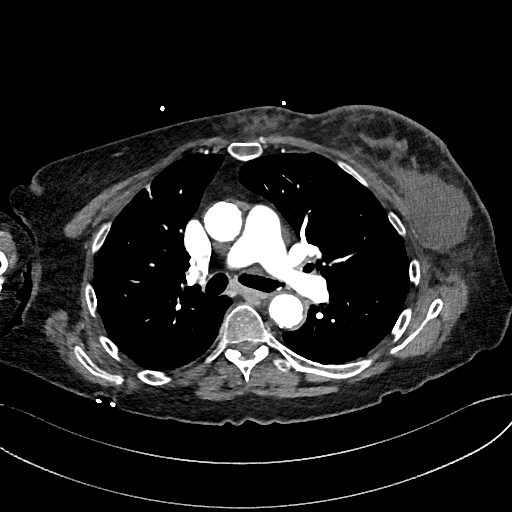
[im 106/154  lung]
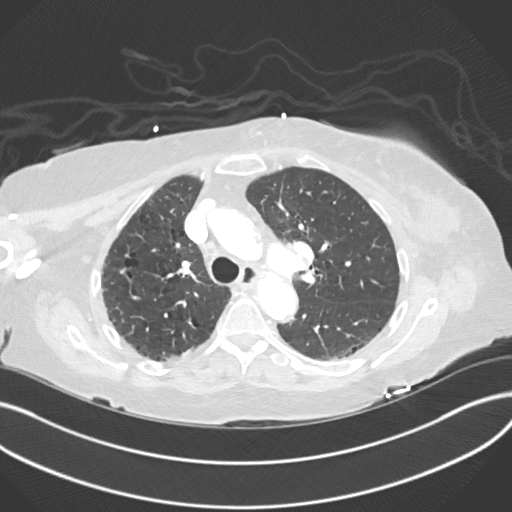
[im 118/154  mediastinal]
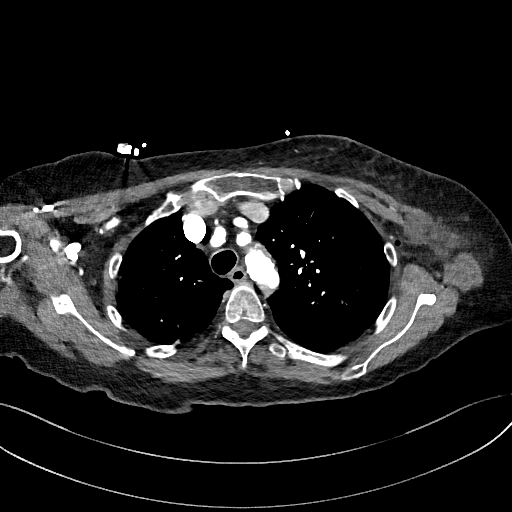
[im 130/154  lung]
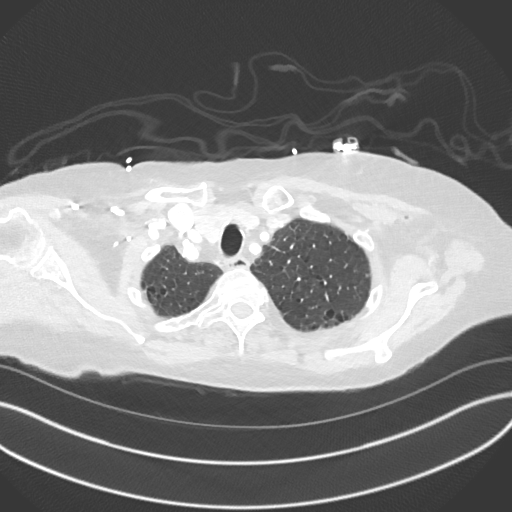
[im 142/154  mediastinal]
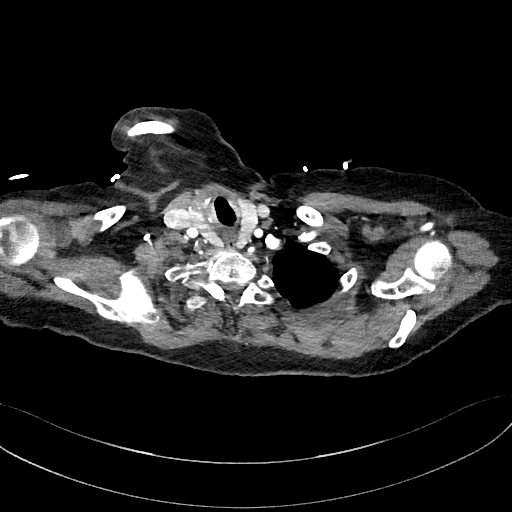

[Series 8: lung · axial · 0.78mm/px · z∈[+1259,+1331]mm · 3 of 154 slices shown]
[im 12/154  mediastinal]
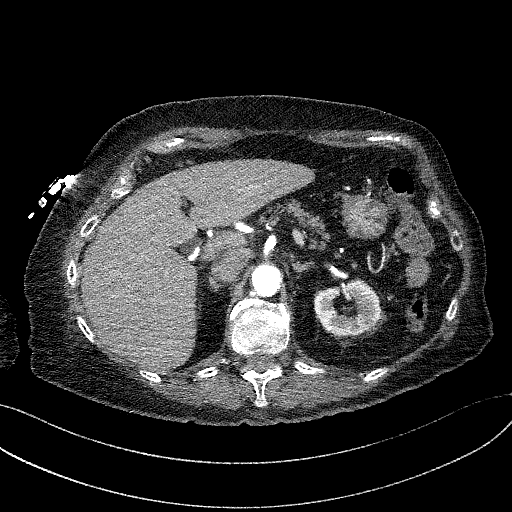
[im 36/154  mediastinal]
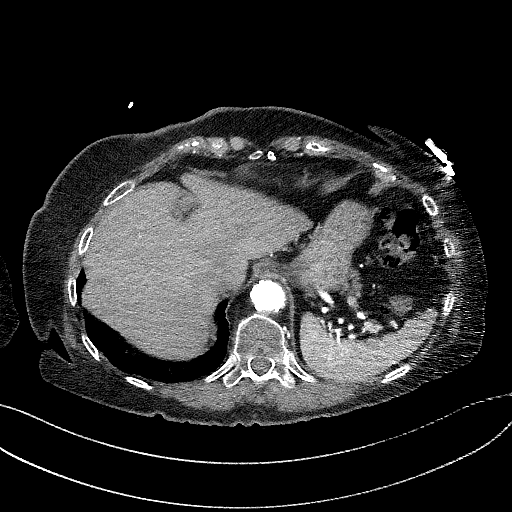
[im 48/154  mediastinal]
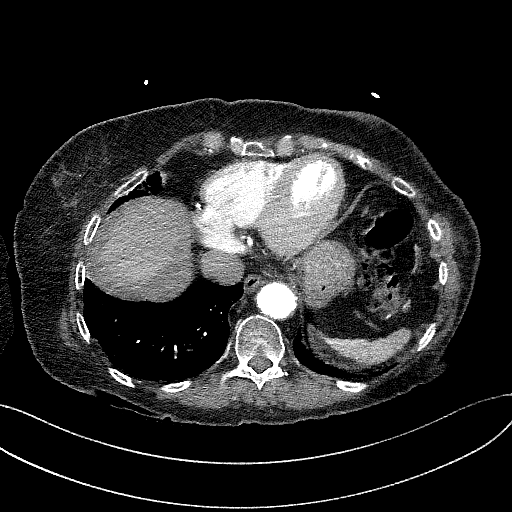

[Series 9: coronal · coronal · 0.60mm/px · 1 of 138 slices shown]
[im 69/138  mediastinal]
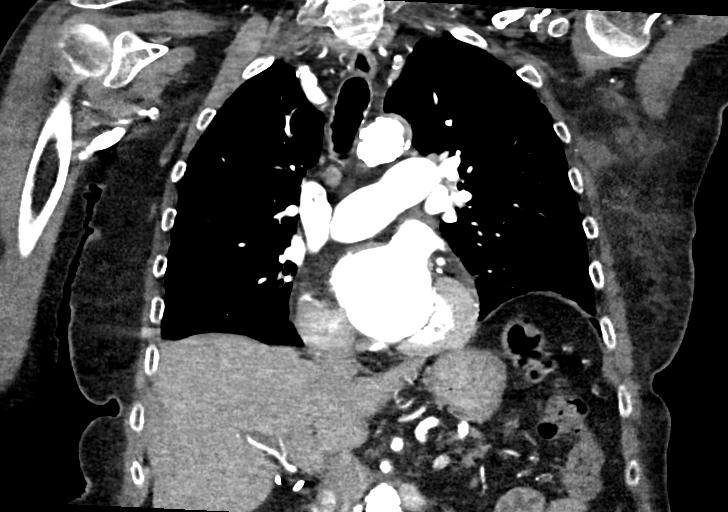

[16 of 36 positions shown; findings below may reference images not displayed]

RADIATION DOSE REDUCTION: This exam was performed according to the
departmental dose-optimization program which includes automated
exposure control, adjustment of the mA and/or kV according to
patient size and/or use of iterative reconstruction technique.

CONTRAST:  100mL OMNIPAQUE IOHEXOL 350 MG/ML SOLN
FINDINGS: Cardiovascular: Heart size is normal. There is extensive calcified
atherosclerotic disease involving the thoracic aorta. Normal caliber
ascending thoracic aorta which measures 2.9 cm. The transverse
aortic arch measures up to 2.6 cm, image 123/10. Within the
descending thoracic aorta at the level of the left atrium there is a
focal penetrating atherosclerotic ulceration of the left
posterolateral aspect of the aorta. Ulceration breath and depth
approximately 1.4 x 1.1 cm, image 91/6. Formally this measured the
same. Maximum caliber of the vessel in total at this level is 3.8 by
2.9 cm, image 91/6. Unchanged from previous exam.

Multi vessel coronary artery calcifications again noted. No signs of
pericardial effusion.

Mediastinum/Nodes: No enlarged axillary, supraclavicular,
mediastinal, or hilar lymph nodes. Esophagus, trachea and thyroid
gland demonstrate no significant findings.

Lungs/Pleura: Paraseptal and centrilobular emphysema is again noted.
Asymmetric elevation of the left hemidiaphragm. No pleural effusion,
airspace consolidation, or pneumothorax. Stable area of parenchymal
scarring within the anterolateral right upper lobe.
Pleuroparenchymal scarring noted within the posterior aspect of the
apical segment of right upper lobe. Dependent changes are noted
within the periphery of both lung bases. No suspicious pulmonary
nodule or mass identified. No acute abnormality within the imaged
portions of the upper abdomen. Extensive aortic atherosclerotic
calcifications.

Upper Abdomen: No acute abnormality.

Musculoskeletal: Postoperative changes within the left breast are
identified. There is overlying skin thickening and subcutaneous soft
tissue stranding. Postoperative fluid collection which likely
represents a resection cavity measures 11.2 by 4.1 by 7.3 cm. Foci
of gas identified within the postop fluid collection.

Review of the MIP images confirms the above findings.
IMPRESSION: 1. Again noted is severe calcified atherosclerotic disease involving
the thoracic and abdominal aorta. Postoperative changes within the
left breast with overlying skin thickening and subcutaneous soft
tissue stranding. Postoperative fluid collection likely represents a
resection cavity.
2. Stable appearance of focal penetrating atherosclerotic ulceration
involving the mid descending thoracic aorta. The ulceration breath
and depth measures approximately 1.4 x 1.1 cm on today's study, not
significantly changed from previous exam. Maximum caliber of the
descending aorta at this level is also unchanged measuring 3.8 x
cm. Recommend vascular specialist referral if not already obtained
for consideration of treatment and
surveillance. This recommendation follows 1848
ACCF/AHA/AATS/ACR/ASA/SCA/JOON/TAHMINA/HOUARIA/NIVIRUS Guidelines for the
diagnosis and Management of Patients With Thoracic Aortic Disease.
circulation. 1848; 121: [id number]. Aortic aneurysm NOS (4BRCI-WR4.Z)
3. Interval left breast surgery. Postoperative fluid collection is
noted measuring 11.2 x 4.1 x 7.3 cm. There is surrounding skin
thickening and subcutaneous soft tissue stranding noted within the
left breast.
4. Multi vessel coronary artery calcifications.
5. Aortic Atherosclerosis (4BRCI-A5S.S) and Emphysema (4BRCI-MIR.K).

## 2022-12-23 ENCOUNTER — Encounter: Payer: Self-pay | Admitting: Oncology

## 2023-01-11 ENCOUNTER — Encounter: Payer: Self-pay | Admitting: Oncology

## 2023-03-02 ENCOUNTER — Encounter: Payer: Self-pay | Admitting: Oncology

## 2023-03-02 ENCOUNTER — Telehealth: Payer: Self-pay

## 2023-03-02 ENCOUNTER — Inpatient Hospital Stay: Payer: PPO | Attending: Oncology

## 2023-03-02 VITALS — BP 124/73 | HR 74 | Temp 98.0°F | Resp 18

## 2023-03-02 DIAGNOSIS — D509 Iron deficiency anemia, unspecified: Secondary | ICD-10-CM | POA: Insufficient documentation

## 2023-03-02 DIAGNOSIS — Z170 Estrogen receptor positive status [ER+]: Secondary | ICD-10-CM | POA: Diagnosis not present

## 2023-03-02 DIAGNOSIS — C50911 Malignant neoplasm of unspecified site of right female breast: Secondary | ICD-10-CM | POA: Insufficient documentation

## 2023-03-02 DIAGNOSIS — Z79811 Long term (current) use of aromatase inhibitors: Secondary | ICD-10-CM | POA: Diagnosis not present

## 2023-03-02 DIAGNOSIS — D508 Other iron deficiency anemias: Secondary | ICD-10-CM

## 2023-03-02 MED ORDER — IRON SUCROSE 20 MG/ML IV SOLN
200.0000 mg | Freq: Once | INTRAVENOUS | Status: AC
  Administered 2023-03-02: 200 mg via INTRAVENOUS
  Filled 2023-03-02: qty 10

## 2023-03-02 MED ORDER — SODIUM CHLORIDE 0.9 % IV SOLN
INTRAVENOUS | Status: DC
Start: 2023-03-02 — End: 2023-03-02

## 2023-03-02 NOTE — Patient Instructions (Signed)

## 2023-03-02 NOTE — Telephone Encounter (Signed)
 Pt was seen by her PCP, Dr Sherral Hammers, yesterday. He did blood work and her Hgb 6.9, and iron is low. I have printed these labs off to show Dr Melvyn Neth and get recommendations.

## 2023-03-05 ENCOUNTER — Ambulatory Visit: Payer: PPO

## 2023-03-07 ENCOUNTER — Other Ambulatory Visit: Payer: Self-pay | Admitting: Oncology

## 2023-03-07 DIAGNOSIS — D508 Other iron deficiency anemias: Secondary | ICD-10-CM

## 2023-03-07 NOTE — Progress Notes (Unsigned)
 [facility]  [address] [place],  [place]  [address] [phone]  Clinic Day:  03/08/2023  Referring physician: Hadley Pen, MD  HISTORY OF PRESENT ILLNESS:  The patient is an 89 y.o. 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  She currently takes anastrozole for her adjuvant endocrine therapy.  However, a more pressing issue is her recurrent iron deficiency anemia.  She is in the process of receiving another course of IV iron.  Her last course was in October/November 2024; however, recent labs showed her iron levels to still be low.  She mentions how she has dark green stools.  She denies having any overt forms of blood loss.    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  The patient elected not to undergo adjuvant breast radiation.  She briefly took tamoxifen, but discontinued it due to it causing numerous side effects, including undesirable weight gain and joint discomfort.    VITALS:  There were no vitals taken for this visit.  Wt Readings from Last 3 Encounters:  11/16/22 153 lb (69.4 kg)  11/09/22 148 lb 6.4 oz (67.3 kg)  11/02/22 153 lb (69.4 kg)    There is no height or weight on file to calculate BMI.  Performance status (ECOG): 1 - Symptomatic but completely ambulatory  PHYSICAL EXAM:  Physical Exam Constitutional:      Appearance: Normal appearance.  HENT:     Mouth/Throat:     Pharynx: Oropharynx is clear. No oropharyngeal exudate.  Cardiovascular:     Rate and Rhythm: Normal rate and regular rhythm.     Heart sounds: No murmur heard.    No friction rub. No gallop.  Pulmonary:     Breath sounds: Normal breath sounds.  Chest:  Breasts:    Right: No swelling, bleeding, inverted nipple, mass, nipple discharge or skin change.     Left: No swelling, bleeding, inverted nipple, mass, nipple  discharge or skin change.  Abdominal:     General: Bowel sounds are normal. There is no distension.     Palpations: Abdomen is soft. There is no mass.     Tenderness: There is no abdominal tenderness.  Musculoskeletal:        General: No tenderness.     Cervical back: Normal range of motion and neck supple.     Right lower leg: No edema.     Left lower leg: No edema.  Lymphadenopathy:     Cervical: No cervical adenopathy.     Right cervical: No superficial, deep or posterior cervical adenopathy.    Left cervical: No superficial, deep or posterior cervical adenopathy.     Upper Body:     Right upper body: No supraclavicular or axillary adenopathy.     Left upper body: No supraclavicular or axillary adenopathy.     Lower Body: No right inguinal adenopathy. No left inguinal adenopathy.  Skin:    Coloration: Skin is not jaundiced.     Findings: No lesion or rash.  Neurological:     General: No focal deficit present.     Mental Status: She is alert and oriented to person, place, and time. Mental status is at baseline.  Psychiatric:        Mood and Affect: Mood normal.        Behavior: Behavior normal.  Thought Content: Thought content normal.        Judgment: Judgment normal.   LABS:    Latest Reference Range & Units 03/08/23 10:14  WBC 4.0 - 10.5 K/uL 5.9  RBC 3.87 - 5.11 MIL/uL 2.50 (L)  Hemoglobin 12.0 - 15.0 g/dL 6.5 (LL)  HCT 78.2 - 95.6 % 22.2 (L)  MCV 80.0 - 100.0 fL 88.8  MCH 26.0 - 34.0 pg 26.0  MCHC 30.0 - 36.0 g/dL 21.3 (L)  RDW 08.6 - 57.8 % 19.9 (H)  Platelets 150 - 400 K/uL 246  nRBC 0.0 - 0.2 % 0 /100 WBC 0.02 0  Neutrophils % 78  Lymphocytes % 10  Monocytes Relative % 9  Eosinophil % 2  Basophil % 1  Immature Granulocytes % 0  (LL): Data is critically low (L): Data is abnormally low (H): Data is abnormally high  Latest Reference Range & Units 03/08/23 10:14  Iron 28 - 170 ug/dL 23 (L)  UIBC ug/dL 469  TIBC 629 - 528 ug/dL 413 (H)  Saturation  Ratios 10.4 - 31.8 % 4 (L)  Ferritin 11 - 307 ng/mL 30  (L): Data is abnormally low (H): Data is abnormally high  ASSESSMENT & PLAN:  Assessment:  An 89 y.o. 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  More importantly now is her persistent iron deficiency anemia.  As her hemoglobin is low at 6.5 today, I will arrange for her to be transfused 2 units of packed red blood cells tomorrow.  She is currently in the midst of receiving IV Venofer; she will receive 5 days of this regimen.  Of note, she had IV Feraheme in October/November 2024.  Based upon the persistence and severity of her iron deficiency anemia, I will refer her to GI in the forthcoming weeks as she likely needs a GI workup to rule out some form of occult GI blood loss.  Otherwise, I will see this patient back in 2 months for repeat clinical assessment.  The patient understands all the plans discussed today and is in agreement with them.  Alinna Siple Kirby Funk, MD

## 2023-03-08 ENCOUNTER — Other Ambulatory Visit: Payer: Self-pay

## 2023-03-08 ENCOUNTER — Other Ambulatory Visit: Payer: Self-pay | Admitting: Oncology

## 2023-03-08 ENCOUNTER — Inpatient Hospital Stay: Payer: PPO

## 2023-03-08 ENCOUNTER — Inpatient Hospital Stay (HOSPITAL_BASED_OUTPATIENT_CLINIC_OR_DEPARTMENT_OTHER): Payer: PPO | Admitting: Oncology

## 2023-03-08 VITALS — BP 148/72 | HR 84 | Temp 97.7°F | Resp 18

## 2023-03-08 DIAGNOSIS — D508 Other iron deficiency anemias: Secondary | ICD-10-CM

## 2023-03-08 DIAGNOSIS — C50911 Malignant neoplasm of unspecified site of right female breast: Secondary | ICD-10-CM | POA: Diagnosis not present

## 2023-03-08 DIAGNOSIS — D649 Anemia, unspecified: Secondary | ICD-10-CM

## 2023-03-08 DIAGNOSIS — D500 Iron deficiency anemia secondary to blood loss (chronic): Secondary | ICD-10-CM | POA: Diagnosis not present

## 2023-03-08 LAB — CBC WITH DIFFERENTIAL (CANCER CENTER ONLY)
Abs Immature Granulocytes: 0.01 10*3/uL (ref 0.00–0.07)
Basophils Absolute: 0 10*3/uL (ref 0.0–0.1)
Basophils Relative: 1 %
Eosinophils Absolute: 0.1 10*3/uL (ref 0.0–0.5)
Eosinophils Relative: 2 %
HCT: 22.2 % — ABNORMAL LOW (ref 36.0–46.0)
Hemoglobin: 6.5 g/dL — CL (ref 12.0–15.0)
Immature Granulocytes: 0 %
Lymphocytes Relative: 10 %
Lymphs Abs: 0.6 10*3/uL — ABNORMAL LOW (ref 0.7–4.0)
MCH: 26 pg (ref 26.0–34.0)
MCHC: 29.3 g/dL — ABNORMAL LOW (ref 30.0–36.0)
MCV: 88.8 fL (ref 80.0–100.0)
Monocytes Absolute: 0.5 10*3/uL (ref 0.1–1.0)
Monocytes Relative: 9 %
Neutro Abs: 4.6 10*3/uL (ref 1.7–7.7)
Neutrophils Relative %: 78 %
Platelet Count: 246 10*3/uL (ref 150–400)
RBC: 2.5 MIL/uL — ABNORMAL LOW (ref 3.87–5.11)
RDW: 19.9 % — ABNORMAL HIGH (ref 11.5–15.5)
WBC Count: 5.9 10*3/uL (ref 4.0–10.5)
nRBC: 0 /100{WBCs}
nRBC: 0.02 % (ref 0.0–0.2)

## 2023-03-08 LAB — PREPARE RBC (CROSSMATCH)

## 2023-03-08 LAB — IRON AND TIBC
Iron: 23 ug/dL — ABNORMAL LOW (ref 28–170)
Saturation Ratios: 4 % — ABNORMAL LOW (ref 10.4–31.8)
TIBC: 526 ug/dL — ABNORMAL HIGH (ref 250–450)
UIBC: 503 ug/dL

## 2023-03-08 LAB — ABO/RH: ABO/RH(D): O POS

## 2023-03-08 LAB — FERRITIN: Ferritin: 30 ng/mL (ref 11–307)

## 2023-03-08 MED ORDER — SODIUM CHLORIDE 0.9 % IV SOLN: INTRAVENOUS | Status: DC

## 2023-03-08 MED ORDER — IRON SUCROSE 20 MG/ML IV SOLN
200.0000 mg | Freq: Once | INTRAVENOUS | Status: AC
  Administered 2023-03-08: 200 mg via INTRAVENOUS
  Filled 2023-03-08: qty 10

## 2023-03-08 NOTE — Patient Instructions (Signed)

## 2023-03-08 NOTE — Progress Notes (Signed)
 CRITICAL VALUE STICKER  CRITICAL VALUE:  Hgb 6.5  RECEIVER (on-site recipient of call):  Dyane Dustman RN  DATE & TIME NOTIFIED:   03/08/2023 @ 1027  MESSENGER (representative from lab):  [facility]  MD NOTIFIED:   Dr. Melvyn Neth   TIME OF NOTIFICATION:  1028  RESPONSE: Transfuse 2 units PRBC.

## 2023-03-09 ENCOUNTER — Inpatient Hospital Stay: Payer: PPO

## 2023-03-09 ENCOUNTER — Encounter: Payer: Self-pay | Admitting: Cardiology

## 2023-03-09 VITALS — BP 140/75 | HR 81 | Temp 97.2°F | Resp 16

## 2023-03-09 DIAGNOSIS — D508 Other iron deficiency anemias: Secondary | ICD-10-CM

## 2023-03-09 DIAGNOSIS — C50911 Malignant neoplasm of unspecified site of right female breast: Secondary | ICD-10-CM | POA: Diagnosis not present

## 2023-03-09 MED ORDER — SODIUM CHLORIDE 0.9% FLUSH: 10.0000 mL | INTRAVENOUS | Status: DC | PRN

## 2023-03-09 MED ORDER — DIPHENHYDRAMINE HCL 25 MG PO CAPS: 25.0000 mg | ORAL_CAPSULE | Freq: Once | ORAL | Status: DC

## 2023-03-09 MED ORDER — SODIUM CHLORIDE 0.9% IV SOLUTION: 250.0000 mL | INTRAVENOUS | Status: DC

## 2023-03-09 MED ORDER — ACETAMINOPHEN 325 MG PO TABS
650.0000 mg | ORAL_TABLET | Freq: Once | ORAL | Status: AC
  Administered 2023-03-09: 650 mg via ORAL
  Filled 2023-03-09: qty 2

## 2023-03-09 MED ORDER — SODIUM CHLORIDE 0.9 % IV SOLN: INTRAVENOUS | Status: DC

## 2023-03-09 MED ORDER — FAMOTIDINE IN NACL 20-0.9 MG/50ML-% IV SOLN
20.0000 mg | Freq: Once | INTRAVENOUS | Status: DC
Start: 2023-03-09 — End: 2023-03-09
  Filled 2023-03-09: qty 50

## 2023-03-09 MED ORDER — IRON SUCROSE 20 MG/ML IV SOLN
200.0000 mg | Freq: Once | INTRAVENOUS | Status: AC
Start: 2023-03-09 — End: 2023-03-09
  Administered 2023-03-09: 200 mg via INTRAVENOUS
  Filled 2023-03-09: qty 10

## 2023-03-09 MED ORDER — SODIUM CHLORIDE 0.9% FLUSH
3.0000 mL | INTRAVENOUS | Status: DC | PRN
Start: 2023-03-09 — End: 2023-03-09

## 2023-03-09 NOTE — Progress Notes (Unsigned)
 Cardiology Office Note:    Date:  03/09/2023   ID:  Marilyn Robinson, DOB 22-Dec-1933, MRN [id number]  PCP:  Hadley Pen, MD  Cardiologist:  Norman Herrlich, MD    Referring MD: Hadley Pen, MD    ASSESSMENT:    1. Paroxysmal atrial fibrillation (HCC)   2. Presence of Watchman left atrial appendage closure device   3. Stenosis of right carotid artery   4. H/O carotid endarterectomy   5. Hypertensive heart disease with heart failure (HCC)   6. Mild CAD   7. Anemia, unspecified type   8. Low hemoglobin    PLAN:    In order of problems listed above:  She is now in chronic atrial fibrillation fortunately has a Watchman device does not require anticoagulation and her rate is controlled with current beta-blocker 25 mg twice daily. She can require antiplatelet therapy directed by her vascular surgeon with residual moderate stenosis right ICA Heart failure looks compensated continue her loop diuretic and hydralazine  stable CAD not having anginal discomfort  anemia and iron deficiency has been addressed by hematology It is certainly not ideal but if anemia continues to be such a problem she may need to stop all antithrombotic therapy.   Next appointment: 3 months   Medication Adjustments/Labs and Tests Ordered: Current medicines are reviewed at length with the patient today.  Concerns regarding medicines are outlined above.  No orders of the defined types were placed in this encounter.  No orders of the defined types were placed in this encounter.    History of Present Illness:    Marilyn Robinson is a 89 y.o. female with a hx of very complex heart disease including CAD hypertensive heart disease with heart failure and stage IV CKD atrial fibrillation and stroke and recurrent GI bleeding with antithrombotic therapy and subsequent Watchman left atrial occlusion device penetrating aortic ulcer on chest CTA prior to watchman history of breast cancer with  surgical lumpectomy anemia and carotid artery disease with a right carotid endarterectomy in June 2024 last seen 09/11/2022.  She follows hematology for chronic iron deficiency and anemia is receiving parenteral IV iron infusion.  Compliance with diet, lifestyle and medications: Yes  She saw vascular surgery was transition from aspirin to clopidogrel because of nosebleeds she was noted to have mid ICA elevated velocities with tortuosity and a 60 to 79% stenosis She is not having obvious GI bleeding but continues to be anemic is requiring transfusion and IV iron administration Had a fall and injured her left knee and is in a wheelchair Fortunately not having shortness of breath orthopnea chest pain palpitation or syncope She has chronic swelling more on the left than the right leg after polio Past Medical History:  Diagnosis Date   Acute lacunar infarction (HCC) 10/19/2016   Ambulatory dysfunction 06/13/2019   Anemia 07/02/2016   At high risk for injury related to fall 10/05/2016   At risk for falls 01/07/2017   Benign paroxysmal vertigo 01/07/2017   Formatting of this note might be different from the original. patient still with fluid behind the ears that may be contributing to positional changes inducing vertigo. finished last dose of antibiotics today. will add nasal sprayto see if draining inner ears will improve sinus symptoms   Bilateral carotid artery stenosis 09/22/2015   BMI 31.0-31.9,adult 05/06/2015   Breast cancer (HCC)    Calculus of gallbladder and bile duct without cholecystitis 01/07/2017   Candidiasis of vulva 05/22/2021   Chronic combined  systolic and diastolic heart failure (HCC) 07/17/2014   Overview:  EF 56% 09/23/10   Chronic insomnia 04/14/2018   Closed fracture of lumbar vertebra (HCC) 07/10/2008   Formatting of this note might be different from the original. Thought patient's knee pain may be referred from hip. xray of hip shows some arthritic changes, but  incidentally, review of lumbar spine shows turning of the bones. believe patient's hip and knee pain may be referred from compression of nerve in back. will request mri, and orthopedic referral   Complete rupture of rotator cuff 01/07/2017   Formatting of this note might be different from the original. patient already has her appointment scheduled for ortho for follow up   Continuous leakage of urine 10/05/2016   DDD (degenerative disc disease), cervical 01/07/2017   Formatting of this note might be different from the original. w/ Pain in R. Arm due to foraminal impingement   Diabetes mellitus with complication (HCC)    Diabetes mellitus without complication (HCC)    Dislocation of right knee with lateral meniscus tear 03/15/2014   Ductal carcinoma in situ (DCIS) of left breast 05/06/2015   Environmental and seasonal allergies 10/05/2016   Flu vaccine need 10/05/2016   GERD (gastroesophageal reflux disease)    Gout 01/07/2017   Gouty arthropathy 01/07/2017   Formatting of this note might be different from the original. will check patient's uric acid level   Hospital discharge follow-up 11/09/2016   Hyperlipidemia 07/17/2014   Hypertension    Hypertensive heart disease with heart failure (HCC) 07/17/2014   Hypertensive urgency 09/22/2015   Hypo-osmolality and hyponatremia 05/29/2015   Formatting of this note might be different from the original. Will recheck patient's sodium level to see if it has returned to normal range --repeat labs today Formatting of this note might be different from the original. Formatting of this note might be different from the original. 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   Major depressive disorder with single episode, in full remission (HCC) 11/01/2020   Malaise and fatigue 06/13/2019   Mass of upper outer quadrant of left breast 04/21/2021   Mild CAD 07/17/2014   Overview:  50% RCA stenosis in 2005   Mixed dyslipidemia 08/25/2017   Need for vaccination against Streptococcus pneumoniae using pneumococcal conjugate vaccine 13 10/05/2016   Non morbid obesity 05/06/2015   Occlusion and stenosis of carotid artery 01/07/2017   Other specified acquired hypothyroidism 01/07/2017   Paroxysmal atrial fibrillation (HCC) 05/22/2021   Peripheral edema 04/30/2017   Personal history of transient ischemic attack (TIA), and cerebral infarction without residual deficits 10/23/2016   Preop cardiovascular exam 01/07/2017   Formatting of this note might be different from the original.  REviewed patient's personalized wellness plan. she is current with screenings.   Pyuria 09/22/2015   Rhinosinusitis 10/14/2018   S/P arthroscopy of knee 05/17/2014   Secondary cardiomyopathy (HCC) 01/07/2017   Formatting of this note might be different from the original. EF repeat in 10/2006 was 69%   Stage 3a chronic kidney disease (HCC) 04/29/2020   Stress due to illness of family member 10/23/2016   Stroke (HCC) 02/19/2021   Stroke-like  episode 05/29/2015   Stroke-like symptoms 05/29/2015   Syncope and collapse 01/07/2017   Formatting of this note might be different from the original.  will have patient stop the hydralazine, stop the probenecide, and cut her furosemide in 1/2. Will have her check her blood pressures at home and see if her diastolic pressures will start to come back up.   Thoracic aortic atherosclerosis (HCC) 05/06/2021   TIA (transient ischemic attack)    Uterine prolapse 07/02/2016   Vitamin D deficiency 11/01/2020   Weakness 05/22/2021    Current Medications: Current Meds  Medication  Sig   clopidogrel (PLAVIX) 75 MG tablet Take 1 tablet by mouth daily.      EKGs/Labs/Other Studies Reviewed:    The following studies were reviewed today:     EKG Interpretation Date/Time:  Wednesday March 10 2023 13:24:53 EST Ventricular Rate:  88 PR Interval:    QRS Duration:  84 QT Interval:  312 QTC Calculation: 377 R Axis:   100  Text Interpretation: Atrial fibrillation Rightward axis Low voltage QRS No previous ECGs available Confirmed by Norman Herrlich ([id number]) on 03/10/2023 1:30:16 PM   Recent Labs: 06/11/2022: BUN 26; Creatinine, Ser 1.45; Potassium 4.5; Sodium 136 03/08/2023: Hemoglobin 6.5; Platelet Count 246  Recent Lipid Panel    Component Value Date/Time   CHOL 119 08/19/2016 1042   TRIG 107 08/19/2016 1042   HDL 41 08/19/2016 1042   CHOLHDL 3.3 05/29/2015 1549   VLDL 22 05/29/2015 1549   LDLCALC 57 08/19/2016 1042    Physical Exam:    VS:  There were no vitals taken for this visit.    Wt Readings from Last 3 Encounters:  11/16/22 153 lb (69.4 kg)  11/09/22 148 lb 6.4 oz (67.3 kg)  11/02/22 153 lb (69.4 kg)     GEN: She is beginning to look frail certainly looks her age not visibly pale well nourished, well developed in no acute distress HEENT: Normal NECK: No JVD; No carotid bruits LYMPHATICS: No lymphadenopathy CARDIAC: RRR, no murmurs, rubs, gallops RESPIRATORY:  Clear to auscultation without rales, wheezing or rhonchi  ABDOMEN: Soft, non-tender, non-distended MUSCULOSKELETAL:  No edema; No deformity  SKIN: Warm and dry NEUROLOGIC:  Alert and oriented x 3 PSYCHIATRIC:  Normal affect    Signed, Norman Herrlich, MD  03/09/2023 12:51 PM    [facility]

## 2023-03-10 ENCOUNTER — Encounter: Payer: Self-pay | Admitting: Cardiology

## 2023-03-10 ENCOUNTER — Ambulatory Visit: Payer: PPO | Attending: Cardiology | Admitting: Cardiology

## 2023-03-10 VITALS — BP 112/54 | HR 88 | Ht 66.0 in | Wt 145.0 lb

## 2023-03-10 DIAGNOSIS — D649 Anemia, unspecified: Secondary | ICD-10-CM

## 2023-03-10 DIAGNOSIS — Z9889 Other specified postprocedural states: Secondary | ICD-10-CM | POA: Diagnosis not present

## 2023-03-10 DIAGNOSIS — Z95818 Presence of other cardiac implants and grafts: Secondary | ICD-10-CM | POA: Diagnosis not present

## 2023-03-10 DIAGNOSIS — I110 Hypertensive heart disease with heart failure: Secondary | ICD-10-CM

## 2023-03-10 DIAGNOSIS — I6521 Occlusion and stenosis of right carotid artery: Secondary | ICD-10-CM | POA: Diagnosis not present

## 2023-03-10 DIAGNOSIS — I2510 Atherosclerotic heart disease of native coronary artery without angina pectoris: Secondary | ICD-10-CM

## 2023-03-10 DIAGNOSIS — I480 Paroxysmal atrial fibrillation: Secondary | ICD-10-CM

## 2023-03-10 LAB — TYPE AND SCREEN
ABO/RH(D): O POS
Antibody Screen: NEGATIVE
Unit division: 0
Unit division: 0

## 2023-03-10 LAB — BPAM RBC
Blood Product Expiration Date: 202503222359
Blood Product Unit Number: 202503222359
ISSUE DATE / TIME: 202502250959
PRODUCT CODE: 202502250959
PRODUCT CODE: 202503222359
Unit Type and Rh: 202503222359
Unit Type and Rh: 5100
Unit Type and Rh: 5100
Unit Type and Rh: 5100

## 2023-03-10 NOTE — Patient Instructions (Signed)
Medication Instructions:  Your physician recommends that you continue on your current medications as directed. Please refer to the Current Medication list given to you today.  *If you need a refill on your cardiac medications before your next appointment, please call your pharmacy*   Lab Work: None If you have labs (blood work) drawn today and your tests are completely normal, you will receive your results only by: MyChart Message (if you have MyChart) OR A paper copy in the mail If you have any lab test that is abnormal or we need to change your treatment, we will call you to review the results.   Testing/Procedures: None   Follow-Up: At [facility], you and your health needs are our priority.  As part of our continuing mission to provide you with exceptional heart care, we have created designated Provider Care Teams.  These Care Teams include your primary Cardiologist (physician) and Advanced Practice Providers (APPs -  Physician Assistants and Nurse Practitioners) who all work together to provide you with the care you need, when you need it.  We recommend signing up for the patient portal called "MyChart".  Sign up information is provided on this After Visit Summary.  MyChart is used to connect with patients for Virtual Visits (Telemedicine).  Patients are able to view lab/test results, encounter notes, upcoming appointments, etc.  Non-urgent messages can be sent to your provider as well.   To learn more about what you can do with MyChart, go to [email or web address].    Your next appointment:   3 month(s)  Provider:   Brian Munley, MD    Other Instructions None  

## 2023-03-12 ENCOUNTER — Telehealth: Payer: Self-pay

## 2023-03-12 ENCOUNTER — Inpatient Hospital Stay: Payer: PPO

## 2023-03-12 VITALS — BP 145/77 | HR 90 | Temp 97.7°F | Resp 18

## 2023-03-12 DIAGNOSIS — C50911 Malignant neoplasm of unspecified site of right female breast: Secondary | ICD-10-CM | POA: Diagnosis not present

## 2023-03-12 DIAGNOSIS — D508 Other iron deficiency anemias: Secondary | ICD-10-CM

## 2023-03-12 MED ORDER — SODIUM CHLORIDE 0.9 % IV SOLN: INTRAVENOUS | Status: DC

## 2023-03-12 MED ORDER — IRON SUCROSE 20 MG/ML IV SOLN
200.0000 mg | Freq: Once | INTRAVENOUS | Status: AC
  Administered 2023-03-12: 200 mg via INTRAVENOUS
  Filled 2023-03-12: qty 10

## 2023-03-12 NOTE — Patient Instructions (Signed)

## 2023-03-12 NOTE — Telephone Encounter (Signed)
 Referral was sent to Dr. Camila Li office on 03-09-2023 for GI workup due to IDA.  Misenheimer's office faxed back facesheet of referral with note that patient does not want to schedule at this time.  Scanned to chart.  (TNB)

## 2023-03-15 ENCOUNTER — Inpatient Hospital Stay: Payer: PPO | Attending: Oncology

## 2023-03-15 ENCOUNTER — Encounter: Payer: Self-pay | Admitting: Oncology

## 2023-03-15 VITALS — BP 161/92 | HR 92 | Temp 97.9°F | Resp 18 | Ht 66.0 in

## 2023-03-15 DIAGNOSIS — D509 Iron deficiency anemia, unspecified: Secondary | ICD-10-CM | POA: Insufficient documentation

## 2023-03-15 DIAGNOSIS — D508 Other iron deficiency anemias: Secondary | ICD-10-CM

## 2023-03-15 MED ORDER — IRON SUCROSE 20 MG/ML IV SOLN
200.0000 mg | Freq: Once | INTRAVENOUS | Status: AC
  Administered 2023-03-15: 200 mg via INTRAVENOUS
  Filled 2023-03-15: qty 10

## 2023-03-15 MED ORDER — SODIUM CHLORIDE 0.9% FLUSH
10.0000 mL | Freq: Once | INTRAVENOUS | Status: AC | PRN
  Administered 2023-03-15: 10 mL

## 2023-03-15 NOTE — Patient Instructions (Signed)

## 2023-04-09 DIAGNOSIS — I371 Nonrheumatic pulmonary valve insufficiency: Secondary | ICD-10-CM

## 2023-04-09 DIAGNOSIS — I340 Nonrheumatic mitral (valve) insufficiency: Secondary | ICD-10-CM

## 2023-04-17 ENCOUNTER — Encounter: Payer: Self-pay | Admitting: Cardiology

## 2023-04-17 NOTE — Progress Notes (Unsigned)
 Cardiology Office Note:  .   Date:  04/19/2023  ID:  Marilyn [facility], DOB September 13, 1933, MRN [id number] PCP: Marilyn Pen, MD  [facility] Providers Cardiologist:  Marilyn Herrlich, MD Electrophysiologist:  Marilyn Prude, MD    History of Present Illness: .   Marilyn Robinson is a 89 y.o. female with a past medical history of hypertension, heart failure, nonobstructive CAD per coronary CT, carotid artery stenosis s/p carotid endarterectomy follows with vascular at [facility], permanent atrial fibrillation with presence of Watchman device, history of stroke, GERD, DM2, hypothyroidism, CKD stage III AA, history of breast cancer, dyslipidemia.  04/09/2023 echo EF 50 to 55%, LA moderately dilated, RA, severely dilated, moderate aortic sclerosis without stenosis, mild to moderate pulmonic regurgitation, moderate to severe tricuspid regurgitation, RVSP severely elevated at 75 mmHg 01/29/2023 carotid Doppler right CEA 60 to 79% stenosis, felt to be secondary to vessel tortuosity, left 40 to 59% stenosis 08/01/2022 right CEA at [facility] 02/18/2022 echo EF 55 to 60% , Biatrial dilatation, trivial MR, calcified firs in right coronary cusp, trace PR 05/27/2021 Lexiscan normal, low risk 04/04/2021 CT cardiac in preparation for Watchman device calcium score 2326  Most recently she was evaluated by Dr. Dulce [facility] on 03/10/2023, her rate is controlled regarding her atrial fibrillation, she was compensated regarding her heart failure, was advised to follow-up in 3 months.  She was admitted to [facility] on 04/08/2023 for heart failure exacerbation, also diagnosed with flu A.  She apparently had suffered a fall in her home, her family was trying to get in touch with her and could not reach her, found her on the floor, she was complaining of shortness of breath and transferred to the hospital.  Chest x-ray revealed bilateral pulmonary infiltrates consistent with edema, she also had left anterior lobe  punctuate intracranial hemorrhage of 4 mm although repeat CT was stable.  Echocardiogram revealed severely elevated RSVP, she was given IV diuretics.  She also required treatment for UTI..    She presents today accompanied by her daughter for follow-up after recent hospitalization as outlined above.  She has already seen her PCP today, he repeated lab work, reemphasized the need for daily weights and decrease her diuretic to Lasix 20 mg daily.  She is doing well from a cardiac perspective, no no formal complaints, she does continue to improve.  She lives independently at home, uses a walker for ambulation.  She has been to wear self daily.  She is wearing compression socks. She denies chest pain, palpitations, dyspnea, pnd, orthopnea, n, v, dizziness, syncope, edema, weight gain, or early satiety.     ROS: Review of Systems  Endo/Heme/Allergies:  Bruises/bleeds easily.  All other systems reviewed and are negative.    Studies Reviewed: .        Cardiac Studies & Procedures   ______________________________________________________________________________________________   STRESS TESTS  MYOCARDIAL PERFUSION IMAGING 05/27/2021  Narrative   The study is normal. The study is low risk.   No ST deviation was noted.   Left ventricular function is normal. Nuclear stress EF: 58 %. The left ventricular ejection fraction is normal (55-65%). End diastolic cavity size is normal.   Prior study not available for comparison.   ECHOCARDIOGRAM  ECHOCARDIOGRAM COMPLETE 05/30/2015  Narrative ** *[facility]* [address] [place], [place] [address] [phone]  ------------------------------------------------------------------- Transthoracic Echocardiography  Patient:    Marilyn, Robinson MR #:       [id number] Study Date: 05/30/2015 Gender:  F Age:        3 Height:     167.6 cm Weight:     82.6 kg BSA:        1.98 m^2 Pt. Status: Room:        5W33C  PERFORMING   W. Viann Fish, MD [facility] SONOGRAPHER  Nolon Rod, RDCS ADMITTING    Perrin Smack REFERRING    [place], Lesle Chris ATTENDING    Lyndal Pulley  cc:  ------------------------------------------------------------------- LV EF: 55% -   60%  ------------------------------------------------------------------- Indications:      CVA 436.  ------------------------------------------------------------------- History:   PMH:   Transient ischemic attack.  Risk factors:  Former tobacco use. Hypertension. Diabetes mellitus.  ------------------------------------------------------------------- Study Conclusions  - Left ventricle: The cavity size was normal. Wall thickness was increased in a pattern of moderate LVH. There was moderate concentric hypertrophy. Systolic function was normal. The estimated ejection fraction was in the range of 55% to 60%. Wall motion was normal; there were no regional wall motion abnormalities. - Mitral valve: Mildly calcified annulus. There was mild regurgitation. - Left [facility]: The [facility] was mildly dilated.  Impressions:  - No cardiac source of emboli was indentified.  Transthoracic echocardiography.  M-mode, complete 2D, spectral Doppler, and color Doppler.  Birthdate:  Patient birthdate: [birth year].  Age:  Patient is 89 yr old.  Sex:  Gender: female. BMI: 29.4 kg/m^2.  Blood pressure:     155/51  Patient status: Inpatient.  Study date:  Study date: 05/30/2015. Study time: 12:03 PM.  Location:  Bedside.  -------------------------------------------------------------------  ------------------------------------------------------------------- Left ventricle:  The cavity size was normal. Wall thickness was increased in a pattern of moderate LVH. There was moderate concentric hypertrophy. Systolic function was normal. The estimated ejection fraction was in the range of 55% to 60%. Wall motion was normal; there  were no regional wall motion abnormalities.  ------------------------------------------------------------------- Aortic valve:   Trileaflet; normal thickness, mildly calcified leaflets. Mobility was not restricted.  Doppler:  Transvalvular velocity was within the normal range. There was no stenosis. There was no regurgitation.  ------------------------------------------------------------------- Aorta:  Aortic root: The aortic root was normal in size.  ------------------------------------------------------------------- Mitral valve:   Mildly calcified annulus. Mobility was not restricted.  Doppler:  Transvalvular velocity was within the normal range. There was no evidence for stenosis. There was mild regurgitation.    Peak gradient (D): 2 mm Hg.  ------------------------------------------------------------------- Left [facility]:  The [facility] was mildly dilated.  ------------------------------------------------------------------- Right ventricle:  The cavity size was normal. Wall thickness was normal. Systolic function was normal.  ------------------------------------------------------------------- Pulmonic valve:   Not well visualized.  Doppler:  Transvalvular velocity was within the normal range. There was no evidence for stenosis.  ------------------------------------------------------------------- Tricuspid valve:   Structurally normal valve.    Doppler: Transvalvular velocity was within the normal range. There was mild regurgitation.  ------------------------------------------------------------------- Pulmonary artery:   The main pulmonary artery was normal-sized. Systolic pressure was within the normal range.  ------------------------------------------------------------------- Right [facility]:  The [facility] was normal in size.  ------------------------------------------------------------------- Pericardium:  There was no pericardial  effusion.  ------------------------------------------------------------------- Systemic veins: Inferior vena cava: The vessel was normal in size.  ------------------------------------------------------------------- Measurements  Left ventricle                           Value        Reference LV ID, ED, PLAX chordal        (  L)       42.5  mm     43 - 52 LV ID, ES, PLAX chordal                  30.7  mm     23 - 38 LV fx shortening, PLAX chordal (L)       28    %      >=29 LV PW thickness, ED                      12.2  mm     --------- IVS/LV PW ratio, ED                      1.23         <=1.3 LV e&', lateral                           6.74  cm/s   --------- LV E/e&', lateral                         11.16        --------- LV e&', medial                            5.66  cm/s   --------- LV E/e&', medial                          13.29        --------- LV e&', average                           6.2   cm/s   --------- LV E/e&', average                         12.13        ---------  Ventricular septum                       Value        Reference IVS thickness, ED                        15    mm     ---------  LVOT                                     Value        Reference LVOT ID, S                               19    mm     --------- LVOT area                                2.84  cm^2   ---------  Aorta                                    Value  Reference Aortic root ID, ED                       32    mm     ---------  Left [facility]                              Value        Reference LA ID, A-P, ES                           36    mm     --------- LA ID/bsa, A-P                           1.81  cm/m^2 <=2.2 LA volume, S                             46.2  ml     --------- LA volume/bsa, S                         23.3  ml/m^2 --------- LA volume, ES, 1-p A4C                   31.9  ml     --------- LA volume/bsa, ES, 1-p A4C               16.1  ml/m^2 --------- LA volume, ES, 1-p A2C                    62.9  ml     --------- LA volume/bsa, ES, 1-p A2C               31.7  ml/m^2 ---------  Mitral valve                             Value        Reference Mitral E-wave peak velocity              75.2  cm/s   --------- Mitral A-wave peak velocity              71.3  cm/s   --------- Mitral deceleration time       (L)       148   ms     150 - 230 Mitral peak gradient, D                  2     mm Hg  --------- Mitral E/A ratio, peak                   1.1          ---------  Right ventricle                          Value        Reference RV s&', lateral, S                        11.9  cm/s   ---------  Legend: (L)  and  (H)  mark values outside specified reference range.  ------------------------------------------------------------------- Prepared and Electronically Authenticated by  Leeroy Bock  Donnie Aho, MD [facility] 2017-05-18T13:30:37    MONITORS  CARDIAC EVENT MONITOR 02/21/2022       ______________________________________________________________________________________________      Risk Assessment/Calculations:    CHA2DS2-VASc Score = 9   This indicates a 12.2% annual risk of stroke. The patient's score is based upon: CHF History: 1 HTN History: 1 Diabetes History: 1 Stroke History: 2 Vascular Disease History: 1 Age Score: 2 Gender Score: 1            Physical Exam:   VS:  BP 116/78 (BP Location: Left Arm, Patient Position: Sitting, Cuff Size: Normal)   Pulse 86   Resp 18   Ht 5' 6.5" (1.689 m)   Wt 132 lb 3.2 oz (60 kg)   SpO2 96%   BMI 21.02 kg/m    Wt Readings from Last 3 Encounters:  04/19/23 132 lb 3.2 oz (60 kg)  03/10/23 145 lb (65.8 kg)  11/16/22 153 lb (69.4 kg)    GEN: Well nourished, well developed in no acute distress NECK: No JVD; No carotid bruits CARDIAC: Irregularly irregular, no murmurs, rubs, gallops RESPIRATORY:  Clear to auscultation without rales, wheezing or rhonchi  ABDOMEN: Soft, non-tender, non-distended EXTREMITIES:  No  edema; No deformity   ASSESSMENT AND PLAN: .   HFpEF-NYHA class I-II, euvolemic.  She appears to be at her dry weight today.  She is weighing daily, I will give her a weight log to track her weight, discussed that she can take an extra dose of Lasix 20 mg as needed to track her weight, discussed that she can take an extra dose of Lasix 20 mg as needed for weight gain of 3 pounds in 1 day or 5 pounds in 1 week.  Continue Coreg 25 mg twice daily, continue olmesartan 40 mg daily.  We did discuss potential of SGLT2 inhibitor however she is still recovering from her hospitalization and also states that she does not typically do well with new medications and would prefer to defer at this time, which I think is reasonable.  Permanent atrial fibrillation/presence of Watchman device with left atrial appendage occlusion-her rate is controlled.   CHA2DS2-VASc score is 9.  Continue Coreg 25 mg twice daily.  CAD-coronary CT in 2023 revealed elevated calcium score.  Stable with no anginal symptoms. No indication for ischemic evaluation.  Currently on Plavix.  Carotid artery stenosis-right CEA in 2024, followed by the vascular at [facility], she is currently on Plavix 75 mg daily.  Hypertension-blood pressure is well-controlled at 116/76, continue Coreg 25 mg twice daily, continue Apresoline 50 mg 3 times daily, continue Benicar 40 mg daily.  DM2-she states her most recent A1c was well-controlled, currently on Glucophage 500 mg twice daily.       Dispo: Keep follow-up with Dr. [facility]ce [facility].  Continue to weigh yourself daily, you may take an additional Lasix as needed for weight gain of 3 pounds in 1 day or 5 pounds in 1 week.  Signed, Flossie Dibble, NP

## 2023-04-19 ENCOUNTER — Ambulatory Visit: Attending: Cardiology | Admitting: Cardiology

## 2023-04-19 ENCOUNTER — Encounter: Payer: Self-pay | Admitting: Cardiology

## 2023-04-19 VITALS — BP 116/78 | HR 86 | Resp 18 | Ht 66.5 in | Wt 132.2 lb

## 2023-04-19 DIAGNOSIS — I2510 Atherosclerotic heart disease of native coronary artery without angina pectoris: Secondary | ICD-10-CM | POA: Diagnosis not present

## 2023-04-19 DIAGNOSIS — I5030 Unspecified diastolic (congestive) heart failure: Secondary | ICD-10-CM | POA: Diagnosis not present

## 2023-04-19 DIAGNOSIS — I6523 Occlusion and stenosis of bilateral carotid arteries: Secondary | ICD-10-CM

## 2023-04-19 DIAGNOSIS — Z95818 Presence of other cardiac implants and grafts: Secondary | ICD-10-CM | POA: Diagnosis not present

## 2023-04-19 DIAGNOSIS — I10 Essential (primary) hypertension: Secondary | ICD-10-CM

## 2023-04-19 DIAGNOSIS — I4821 Permanent atrial fibrillation: Secondary | ICD-10-CM

## 2023-04-19 DIAGNOSIS — Z9889 Other specified postprocedural states: Secondary | ICD-10-CM

## 2023-04-19 NOTE — Patient Instructions (Signed)
 Medication Instructions:  Take an extra 20 mg of Lasix if you gain 3 or more pounds in a day or 5 pounds in a week.    *If you need a refill on your cardiac medications before your next appointment, please call your pharmacy*   Lab Work: None Ordered If you have labs (blood work) drawn today and your tests are completely normal, you will receive your results only by: MyChart Message (if you have MyChart) OR A paper copy in the mail If you have any lab test that is abnormal or we need to change your treatment, we will call you to review the results.   Testing/Procedures: None Ordered   Follow-Up: At [facility], you and your health needs are our priority.  As part of our continuing mission to provide you with exceptional heart care, we have created designated Provider Care Teams.  These Care Teams include your primary Cardiologist (physician) and Advanced Practice Providers (APPs -  Physician Assistants and Nurse Practitioners) who all work together to provide you with the care you need, when you need it.  We recommend signing up for the patient portal called "MyChart".  Sign up information is provided on this After Visit Summary.  MyChart is used to connect with patients for Virtual Visits (Telemedicine).  Patients are able to view lab/test results, encounter notes, upcoming appointments, etc.  Non-urgent messages can be sent to your provider as well.   To learn more about what you can do with MyChart, go to [email or web address].    Your next appointment:

## 2023-05-05 NOTE — Progress Notes (Unsigned)
 [facility]  [address] [place],  [place]  [address] [phone]  Clinic Day:  05/06/2023  Referring physician: Melva Stabile, MD  HISTORY OF PRESENT ILLNESS:  The patient is an 89 y.o. 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  She currently takes anastrozole  for her adjuvant endocrine therapy.  However, a more pressing issue is her recurrent iron  deficiency anemia.  She comes in today to reassess her iron  and hemoglobin levels after receiving another course of IV iron  in February/March 2025.  The patient claims to feel better today than what she did at her last visit.  She continues to deny having any overt forms of blood loss.  However, the patient was admitted recently for falling and fracturing a periorbital bone.    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  The patient elected not to undergo adjuvant breast radiation.  She briefly took tamoxifen, but discontinued it due to it causing numerous side effects, including undesirable weight gain and joint discomfort.    VITALS:  Blood pressure (!) 118/58, pulse 64, temperature 98.3 F (36.8 C), temperature source Oral, resp. rate 16, height 5' 6.5" (1.689 m), weight 132 lb 8 oz (60.1 kg), SpO2 92%.  Wt Readings from Last 3 Encounters:  05/06/23 132 lb 8 oz (60.1 kg)  04/19/23 132 lb 3.2 oz (60 kg)  03/10/23 145 lb (65.8 kg)    Body mass index is 21.07 kg/m.  Performance status (ECOG): 1 - Symptomatic but completely ambulatory  PHYSICAL EXAM:  Physical Exam Constitutional:      Appearance: Normal appearance.  HENT:     Mouth/Throat:     Pharynx: Oropharynx is clear. No oropharyngeal exudate.  Cardiovascular:     Rate and Rhythm: Normal rate and regular rhythm.     Heart sounds: No murmur heard.    No friction rub. No gallop.  Pulmonary:     Breath  sounds: Normal breath sounds.  Chest:  Breasts:    Right: No swelling, bleeding, inverted nipple, mass, nipple discharge or skin change.     Left: No swelling, bleeding, inverted nipple, mass, nipple discharge or skin change.  Abdominal:     General: Bowel sounds are normal. There is no distension.     Palpations: Abdomen is soft. There is no mass.     Tenderness: There is no abdominal tenderness.  Musculoskeletal:        General: No tenderness.     Cervical back: Normal range of motion and neck supple.     Right lower leg: No edema.     Left lower leg: No edema.  Lymphadenopathy:     Cervical: No cervical adenopathy.     Right cervical: No superficial, deep or posterior cervical adenopathy.    Left cervical: No superficial, deep or posterior cervical adenopathy.     Upper Body:     Right upper body: No supraclavicular or axillary adenopathy.     Left upper body: No supraclavicular or axillary adenopathy.     Lower Body: No right inguinal adenopathy. No left inguinal adenopathy.  Skin:    Coloration: Skin is not jaundiced.     Findings: No lesion or rash.  Neurological:     General: No focal deficit present.     Mental Status: She is alert and oriented to person, place, and time.  Mental status is at baseline.  Psychiatric:        Mood and Affect: Mood normal.        Behavior: Behavior normal.        Thought Content: Thought content normal.        Judgment: Judgment normal.   LABS:    Latest Reference Range & Units 05/06/23 09:12  WBC 4.0 - 10.5 K/uL 6.7  RBC 3.87 - 5.11 MIL/uL 2.62 (L)  Hemoglobin 12.0 - 15.0 g/dL 8.0 (L)  HCT 16.1 - 09.6 % 26.4 (L)  MCV 80.0 - 100.0 fL 100.8 (H)  MCH 26.0 - 34.0 pg 30.5  MCHC 30.0 - 36.0 g/dL 04.5  RDW 40.9 - 81.1 % 18.6 (H)  Platelets 150 - 400 K/uL 268  nRBC 0.0 - 0.2 % 0 /100 WBC 0.00 0  Neutrophils % 80  Lymphocytes % 11  Monocytes Relative % 7  Eosinophil % 2  Basophil % 0  Immature Granulocytes % 0  (L): Data is abnormally  low (H): Data is abnormally high  Latest Reference Range & Units 05/06/23 09:12  Iron  28 - 170 ug/dL 26 (L)  UIBC ug/dL 914  TIBC 782 - 956 ug/dL 213 (H)  Saturation Ratios 10.4 - 31.8 % 5 (L)  Ferritin 11 - 307 ng/mL 18  (L): Data is abnormally low (H): Data is abnormally high  ASSESSMENT & PLAN:  Assessment:  An 89 y.o. 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  More importantly now is her persistent iron  deficiency anemia.  Once again, despite receiving IV iron  recently, her hemoglobin remains low at 8.0.  Furthermore, her iron  parameters are low again.  I will arrange for her to receive another course of IV iron  over these next few weeks to rapidly replenish her iron  stores and normalize her hemoglobin.  The patient was told not to consider a GI workup with respect to her recurrent iron  deficiency anemia.  However, I would prefer for her at least to undergo Cologuard testing to check for occult GI blood loss.  If her Cologuard test comes back positive, then I do believe a formal GI workup would be necessary, irrespective of her current age, especially when considering the rapidity in which her hemoglobin has been falling despite receiving multiple courses of IV iron .  Otherwise, I will see this patient back in 2 months to reassess her iron  and hemoglobin levels.  The patient understands all the plans discussed today and is in agreement with them.  Mikhaila Roh Felicia Horde, MD

## 2023-05-06 ENCOUNTER — Inpatient Hospital Stay: Payer: PPO | Attending: Oncology | Admitting: Oncology

## 2023-05-06 ENCOUNTER — Telehealth: Payer: Self-pay

## 2023-05-06 ENCOUNTER — Other Ambulatory Visit: Payer: Self-pay | Admitting: Oncology

## 2023-05-06 ENCOUNTER — Telehealth: Payer: Self-pay | Admitting: Oncology

## 2023-05-06 ENCOUNTER — Inpatient Hospital Stay: Payer: PPO

## 2023-05-06 VITALS — BP 118/58 | HR 64 | Temp 98.3°F | Resp 16 | Ht 66.5 in | Wt 132.5 lb

## 2023-05-06 DIAGNOSIS — Z79811 Long term (current) use of aromatase inhibitors: Secondary | ICD-10-CM | POA: Insufficient documentation

## 2023-05-06 DIAGNOSIS — D649 Anemia, unspecified: Secondary | ICD-10-CM

## 2023-05-06 DIAGNOSIS — D508 Other iron deficiency anemias: Secondary | ICD-10-CM

## 2023-05-06 DIAGNOSIS — D509 Iron deficiency anemia, unspecified: Secondary | ICD-10-CM | POA: Diagnosis present

## 2023-05-06 DIAGNOSIS — D500 Iron deficiency anemia secondary to blood loss (chronic): Secondary | ICD-10-CM

## 2023-05-06 DIAGNOSIS — Z170 Estrogen receptor positive status [ER+]: Secondary | ICD-10-CM | POA: Insufficient documentation

## 2023-05-06 DIAGNOSIS — C50912 Malignant neoplasm of unspecified site of left female breast: Secondary | ICD-10-CM | POA: Diagnosis present

## 2023-05-06 LAB — CBC WITH DIFFERENTIAL (CANCER CENTER ONLY)
Abs Immature Granulocytes: 0.03 10*3/uL (ref 0.00–0.07)
Basophils Absolute: 0 10*3/uL (ref 0.0–0.1)
Basophils Relative: 0 %
Eosinophils Absolute: 0.2 10*3/uL (ref 0.0–0.5)
Eosinophils Relative: 2 %
HCT: 26.4 % — ABNORMAL LOW (ref 36.0–46.0)
Hemoglobin: 8 g/dL — ABNORMAL LOW (ref 12.0–15.0)
Immature Granulocytes: 0 %
Lymphocytes Relative: 11 %
Lymphs Abs: 0.7 10*3/uL (ref 0.7–4.0)
MCH: 30.5 pg (ref 26.0–34.0)
MCHC: 30.3 g/dL (ref 30.0–36.0)
MCV: 100.8 fL — ABNORMAL HIGH (ref 80.0–100.0)
Monocytes Absolute: 0.5 10*3/uL (ref 0.1–1.0)
Monocytes Relative: 7 %
Neutro Abs: 5.3 10*3/uL (ref 1.7–7.7)
Neutrophils Relative %: 80 %
Platelet Count: 268 10*3/uL (ref 150–400)
RBC: 2.62 MIL/uL — ABNORMAL LOW (ref 3.87–5.11)
RDW: 18.6 % — ABNORMAL HIGH (ref 11.5–15.5)
WBC Count: 6.7 10*3/uL (ref 4.0–10.5)
nRBC: 0 % (ref 0.0–0.2)
nRBC: 0 /100{WBCs}

## 2023-05-06 LAB — IRON AND TIBC
Iron: 26 ug/dL — ABNORMAL LOW (ref 28–170)
Saturation Ratios: 5 % — ABNORMAL LOW (ref 10.4–31.8)
TIBC: 486 ug/dL — ABNORMAL HIGH (ref 250–450)
UIBC: 460 ug/dL

## 2023-05-06 LAB — FERRITIN: Ferritin: 18 ng/mL (ref 11–307)

## 2023-05-06 NOTE — Telephone Encounter (Signed)
 Latest Reference Range & Units 05/06/23 09:12  Iron  28 - 170 ug/dL 26 (L)  UIBC ug/dL 782  TIBC 956 - 213 ug/dL 086 (H)  Saturation Ratios 10.4 - 31.8 % 5 (L)  Ferritin 11 - 307 ng/mL 18  (L): Data is abnormally low (H): Data is abnormally high

## 2023-05-06 NOTE — Telephone Encounter (Signed)
 Patient has been scheduled for follow-up visit per 05/06/23 LOS.  Pt given an appt calendar with date and time.

## 2023-05-07 ENCOUNTER — Encounter: Payer: Self-pay | Admitting: Oncology

## 2023-05-07 ENCOUNTER — Telehealth: Payer: Self-pay | Admitting: Oncology

## 2023-05-07 NOTE — Telephone Encounter (Signed)
 Contacted pt to schedule an appt. Pt states her care giver has left for the day and wishes to call us  back once cleared with her.      Scheduling Message Entered by Holly Pond, AMY W on 05/06/2023 at  3:19 PM Priority: High INFUSION 1HR30MIN (90)  Department: [facility]  Provider: Deloria Fetch, MD  Appointment Notes:  Needs IV iron 

## 2023-05-10 NOTE — Telephone Encounter (Signed)
 Patient has been scheduled. Aware of appt dates and times.

## 2023-05-11 ENCOUNTER — Inpatient Hospital Stay

## 2023-05-11 VITALS — BP 119/74 | HR 74 | Temp 98.4°F | Resp 18

## 2023-05-11 DIAGNOSIS — D509 Iron deficiency anemia, unspecified: Secondary | ICD-10-CM | POA: Diagnosis not present

## 2023-05-11 DIAGNOSIS — D500 Iron deficiency anemia secondary to blood loss (chronic): Secondary | ICD-10-CM

## 2023-05-11 DIAGNOSIS — D508 Other iron deficiency anemias: Secondary | ICD-10-CM

## 2023-05-11 MED ORDER — IRON SUCROSE 20 MG/ML IV SOLN
200.0000 mg | Freq: Once | INTRAVENOUS | Status: AC
  Administered 2023-05-11: 200 mg via INTRAVENOUS
  Filled 2023-05-11: qty 10

## 2023-05-11 NOTE — Patient Instructions (Signed)

## 2023-05-13 ENCOUNTER — Inpatient Hospital Stay: Attending: Oncology

## 2023-05-13 VITALS — BP 121/55 | HR 74 | Temp 98.9°F | Resp 18

## 2023-05-13 DIAGNOSIS — D508 Other iron deficiency anemias: Secondary | ICD-10-CM

## 2023-05-13 DIAGNOSIS — D509 Iron deficiency anemia, unspecified: Secondary | ICD-10-CM | POA: Insufficient documentation

## 2023-05-13 DIAGNOSIS — D500 Iron deficiency anemia secondary to blood loss (chronic): Secondary | ICD-10-CM

## 2023-05-13 MED ORDER — SODIUM CHLORIDE 0.9% FLUSH
10.0000 mL | Freq: Once | INTRAVENOUS | Status: DC | PRN
Start: 2023-05-13 — End: 2023-05-13

## 2023-05-13 MED ORDER — ALBUTEROL SULFATE HFA 108 (90 BASE) MCG/ACT IN AERS
2.0000 | INHALATION_SPRAY | Freq: Once | RESPIRATORY_TRACT | Status: DC | PRN
Start: 2023-05-13 — End: 2023-05-13

## 2023-05-13 MED ORDER — METHYLPREDNISOLONE SODIUM SUCC 125 MG IJ SOLR
125.0000 mg | Freq: Once | INTRAMUSCULAR | Status: DC | PRN
Start: 2023-05-13 — End: 2023-05-13

## 2023-05-13 MED ORDER — DIPHENHYDRAMINE HCL 50 MG/ML IJ SOLN: 50.0000 mg | Freq: Once | INTRAMUSCULAR | Status: DC | PRN

## 2023-05-13 MED ORDER — SODIUM CHLORIDE 0.9 % IV SOLN: Freq: Once | INTRAVENOUS | Status: DC | PRN

## 2023-05-13 MED ORDER — EPINEPHRINE 0.3 MG/0.3ML IJ SOAJ: 0.3000 mg | Freq: Once | INTRAMUSCULAR | Status: DC | PRN

## 2023-05-13 MED ORDER — IRON SUCROSE 20 MG/ML IV SOLN
200.0000 mg | Freq: Once | INTRAVENOUS | Status: AC
  Administered 2023-05-13: 200 mg via INTRAVENOUS
  Filled 2023-05-13: qty 10

## 2023-05-13 MED ORDER — FAMOTIDINE IN NACL 20-0.9 MG/50ML-% IV SOLN: 20.0000 mg | Freq: Once | INTRAVENOUS | Status: DC | PRN

## 2023-05-13 MED ORDER — SODIUM CHLORIDE 0.9 % IV SOLN: INTRAVENOUS | Status: DC

## 2023-05-13 NOTE — Patient Instructions (Signed)

## 2023-05-18 ENCOUNTER — Inpatient Hospital Stay

## 2023-05-18 VITALS — BP 133/54 | HR 73 | Temp 97.8°F | Resp 18

## 2023-05-18 DIAGNOSIS — D500 Iron deficiency anemia secondary to blood loss (chronic): Secondary | ICD-10-CM

## 2023-05-18 DIAGNOSIS — D508 Other iron deficiency anemias: Secondary | ICD-10-CM

## 2023-05-18 DIAGNOSIS — D509 Iron deficiency anemia, unspecified: Secondary | ICD-10-CM | POA: Diagnosis not present

## 2023-05-18 MED ORDER — IRON SUCROSE 20 MG/ML IV SOLN
200.0000 mg | Freq: Once | INTRAVENOUS | Status: AC
  Administered 2023-05-18: 200 mg via INTRAVENOUS
  Filled 2023-05-18: qty 10

## 2023-05-18 MED ORDER — SODIUM CHLORIDE 0.9% FLUSH
10.0000 mL | Freq: Once | INTRAVENOUS | Status: AC | PRN
  Administered 2023-05-18: 10 mL

## 2023-05-18 NOTE — Patient Instructions (Signed)

## 2023-05-21 ENCOUNTER — Inpatient Hospital Stay

## 2023-05-21 VITALS — BP 115/71 | HR 76 | Temp 98.1°F | Resp 18

## 2023-05-21 DIAGNOSIS — D508 Other iron deficiency anemias: Secondary | ICD-10-CM

## 2023-05-21 DIAGNOSIS — D500 Iron deficiency anemia secondary to blood loss (chronic): Secondary | ICD-10-CM

## 2023-05-21 DIAGNOSIS — D509 Iron deficiency anemia, unspecified: Secondary | ICD-10-CM | POA: Diagnosis not present

## 2023-05-21 MED ORDER — SODIUM CHLORIDE 0.9% FLUSH: 10.0000 mL | Freq: Once | INTRAVENOUS | Status: DC | PRN

## 2023-05-21 MED ORDER — IRON SUCROSE 20 MG/ML IV SOLN
200.0000 mg | Freq: Once | INTRAVENOUS | Status: AC
  Administered 2023-05-21: 200 mg via INTRAVENOUS
  Filled 2023-05-21: qty 10

## 2023-05-21 NOTE — Patient Instructions (Signed)

## 2023-05-25 ENCOUNTER — Inpatient Hospital Stay

## 2023-05-25 VITALS — BP 127/55 | HR 90 | Temp 97.6°F | Resp 18

## 2023-05-25 DIAGNOSIS — D509 Iron deficiency anemia, unspecified: Secondary | ICD-10-CM | POA: Diagnosis not present

## 2023-05-25 DIAGNOSIS — D500 Iron deficiency anemia secondary to blood loss (chronic): Secondary | ICD-10-CM

## 2023-05-25 DIAGNOSIS — D508 Other iron deficiency anemias: Secondary | ICD-10-CM

## 2023-05-25 MED ORDER — IRON SUCROSE 20 MG/ML IV SOLN
200.0000 mg | Freq: Once | INTRAVENOUS | Status: AC
  Administered 2023-05-25: 200 mg via INTRAVENOUS
  Filled 2023-05-25: qty 10

## 2023-05-25 NOTE — Patient Instructions (Signed)

## 2023-06-01 ENCOUNTER — Other Ambulatory Visit: Payer: Self-pay

## 2023-06-01 DIAGNOSIS — I2510 Atherosclerotic heart disease of native coronary artery without angina pectoris: Secondary | ICD-10-CM | POA: Insufficient documentation

## 2023-06-03 ENCOUNTER — Ambulatory Visit

## 2023-06-03 VITALS — BP 140/80 | HR 100 | Ht 66.5 in | Wt 131.0 lb

## 2023-06-03 DIAGNOSIS — I50812 Chronic right heart failure: Secondary | ICD-10-CM

## 2023-06-03 DIAGNOSIS — I6523 Occlusion and stenosis of bilateral carotid arteries: Secondary | ICD-10-CM | POA: Diagnosis not present

## 2023-06-03 DIAGNOSIS — I340 Nonrheumatic mitral (valve) insufficiency: Secondary | ICD-10-CM | POA: Insufficient documentation

## 2023-06-03 DIAGNOSIS — I2510 Atherosclerotic heart disease of native coronary artery without angina pectoris: Secondary | ICD-10-CM

## 2023-06-03 DIAGNOSIS — I4821 Permanent atrial fibrillation: Secondary | ICD-10-CM

## 2023-06-03 DIAGNOSIS — I10 Essential (primary) hypertension: Secondary | ICD-10-CM

## 2023-06-03 MED ORDER — FUROSEMIDE 20 MG PO TABS: 20.0000 mg | ORAL_TABLET | ORAL | 3 refills | Status: DC | PRN

## 2023-06-03 NOTE — Patient Instructions (Signed)
 Medication Instructions:  Your physician recommends that you continue on your current medications as directed. Please refer to the Current Medication list given to you today.  *If you need a refill on your cardiac medications before your next appointment, please call your pharmacy*  Lab Work: None If you have labs (blood work) drawn today and your tests are completely normal, you will receive your results only by: MyChart Message (if you have MyChart) OR A paper copy in the mail If you have any lab test that is abnormal or we need to change your treatment, we will call you to review the results.  Testing/Procedures: Your physician has requested that you have an echocardiogram. Echocardiography is a painless test that uses sound waves to create images of your heart. It provides your doctor with information about the size and shape of your heart and how well your heart's chambers and valves are working. This procedure takes approximately one hour. There are no restrictions for this procedure. Please do NOT wear cologne, perfume, aftershave, or lotions (deodorant is allowed). Please arrive 15 minutes prior to your appointment time.  Please note: We ask at that you not bring children with you during ultrasound (echo/ vascular) testing. Due to room size and safety concerns, children are not allowed in the ultrasound rooms during exams. Our front office staff cannot provide observation of children in our lobby area while testing is being conducted. An adult accompanying a patient to their appointment will only be allowed in the ultrasound room at the discretion of the ultrasound technician under special circumstances. We apologize for any inconvenience.   Follow-Up: At [facility], you and your health needs are our priority.  As part of our continuing mission to provide you with exceptional heart care, our providers are all part of one team.  This team includes your primary Cardiologist  (physician) and Advanced Practice Providers or APPs (Physician Assistants and Nurse Practitioners) who all work together to provide you with the care you need, when you need it.  Your next appointment:   6 month(s)  Provider:   Bertha Broad, MD    We recommend signing up for the patient portal called "MyChart".  Sign up information is provided on this After Visit Summary.  MyChart is used to connect with patients for Virtual Visits (Telemedicine).  Patients are able to view lab/test results, encounter notes, upcoming appointments, etc.  Non-urgent messages can be sent to your provider as well.   To learn more about what you can do with MyChart, go to [email or web address].   Other Instructions None

## 2023-06-03 NOTE — Assessment & Plan Note (Signed)
 Rate controlled, remains in atrial fibrillation. CHA2DS2-VASc score elevated 9. S/p Watchman device implant in 2023. Not on anticoagulation.  Continue rate control with carvedilol.

## 2023-06-03 NOTE — Assessment & Plan Note (Signed)
 Well-controlled. Continue current medications carvedilol, hydralazine , olmesartan as reviewed above under heart failure.

## 2023-06-03 NOTE — Assessment & Plan Note (Signed)
 Chronic right heart failure with preserved LVEF 50 to 55% and dilated RV with reduced function and severely elevated RVSP 35 mmHg on echocardiogram March 2025 at [facility]. This was in the setting of flu and pneumonia.  Appears compensated. Mildly hypervolemic, with trace ankle edema and uses compression stockings bilaterally. NYHA class II  Remains on low-sodium diet and adherent with it strictly. Continues using furosemide  20 mg on most days, prescribed to be used as needed.  Continue with carvedilol 25 mg twice daily Hydralazine  50 mg twice daily. Continue olmesartan 40 mg once a day.  Will obtain transthoracic echocardiogram for reassessment of LV and RV function and pulmonary pressures in the setting of prior abnormal findings on echocardiogram March 2025 for interval change given improvement in her respiratory status.

## 2023-06-03 NOTE — Progress Notes (Signed)
 Cardiology Consultation:    Date:  06/03/2023   ID:  Tifany Hirsch, DOB August 31, 1933, MRN [id number]  PCP:  Melva Stabile, MD  Cardiologist:  Daymon Evans Selam Pietsch, MD   Referring MD: Melva Stabile, MD   Chief Complaint  Patient presents with   Follow-up     ASSESSMENT AND PLAN:   Ms. Blacklock 89 year old woman history of chronic heart failure with preserved ejection fraction, elevated calcium  score 2326 March 2023 [on cardiac CT done for watchman evaluation], subsequent Lexiscan  stress test May 2023 low risk study, carotid artery disease s/p right CEA ([facility]], transthoracic echocardiogram March 2025 at [facility] low normal LVEF 50 to 55% with dilated RV and reduced function, biatrial dilatation, mild MR with severe RVSP elevation 75 mmHg, moderate to severe TR, mild to moderate pulmonary insufficiency.   Also has history of permanent atrial fibrillation s/p watchman implant 2023 not on anticoagulation, hypertension, hyperlipidemia, diabetes mellitus, hypothyroidism, CKD stage III, CVA, GERD, breast cancer, recent unwitnessed fall at home in March 2025 during flulike illness and noted to have small intracranial hemorrhage that was stable on repeat CT imaging.  Problem List Items Addressed This Visit     Bilateral carotid artery stenosis   S/p right carotid endarterectomy done through [facility]. Remains on Plavix  75 mg once daily. Remains on lipid-lowering therapy with rosuvastatin  10 mg once daily.       Relevant Medications   furosemide  (LASIX ) 20 MG tablet   Chronic right heart failure (HCC) - Primary   Chronic right heart failure with preserved LVEF 50 to 55% and dilated RV with reduced function and severely elevated RVSP 35 mmHg on echocardiogram March 2025 at [facility]. This was in the setting of flu and pneumonia.  Appears compensated. Mildly hypervolemic, with trace ankle edema and uses compression stockings  bilaterally. NYHA class II  Remains on low-sodium diet and adherent with it strictly. Continues using furosemide  20 mg on most days, prescribed to be used as needed.  Continue with carvedilol 25 mg twice daily Hydralazine  50 mg twice daily. Continue olmesartan 40 mg once a day.  Will obtain transthoracic echocardiogram for reassessment of LV and RV function and pulmonary pressures in the setting of prior abnormal findings on echocardiogram March 2025 for interval change given improvement in her respiratory status.       Relevant Medications   furosemide  (LASIX ) 20 MG tablet   Hypertension   Well-controlled. Continue current medications carvedilol, hydralazine , olmesartan as reviewed above under heart failure.       Relevant Medications   furosemide  (LASIX ) 20 MG tablet   Permanent atrial fibrillation (HCC)   Rate controlled, remains in atrial fibrillation. CHA2DS2-VASc score elevated 9. S/p Watchman device implant in 2023. Not on anticoagulation.  Continue rate control with carvedilol.       Relevant Medications   furosemide  (LASIX ) 20 MG tablet   CAD (coronary artery disease)   Elevated coronary calcium  score 2326 on cardiac CT imaging done for prewatchman evaluation in March 2023. Subsequent Lexiscan  stress test May 2023 showed no ischemia, low risk study.  No anginal symptoms. Good baseline functional status considering her age uses a walker to ambulate.  Not on aspirin  due to side effects and intolerance. Remains on Plavix  75 mg once daily.       Relevant Medications   furosemide  (LASIX ) 20 MG tablet   Other Relevant Orders   ECHOCARDIOGRAM COMPLETE   Mild mitral regurgitation   On prior echocardiogram  March 2025. Will follow-up on repeat echocardiogram.       Relevant Medications   furosemide  (LASIX ) 20 MG tablet   Return to clinic in 6 months or earlier on an as-needed basis.   History of Present Illness:    Sharine Cadle is a 89 y.o.  female who is being seen today for follow-up visit. PCP is Melva Stabile, MD. Last visit with us  in the office was 04-19-2023 with Pattricia Bores, NP-C.  Has history of chronic heart failure with preserved ejection fraction, elevated calcium  score 2326 March 2023 [on cardiac CT done for watchman evaluation], subsequent Lexiscan  stress test May 2023 low risk study, carotid artery disease s/p right CEA ([facility]], transthoracic echocardiogram March 2025 at [facility] low normal LVEF 50 to 55% with dilated RV and reduced function, biatrial dilatation, mild MR with severe RVSP elevation 75 mmHg, moderate to severe TR, mild to moderate pulmonary insufficiency.   Also has history of permanent atrial fibrillation s/p watchman implant 2023 not on anticoagulation, hypertension, hyperlipidemia, diabetes mellitus, hypothyroidism, CKD stage III, CVA, GERD, breast cancer, recent unwitnessed fall at home in March 2025 during flulike illness and noted to have small intracranial hemorrhage that was stable on repeat CT imaging.  Pleasant woman here for the visit by herself.  Has percent who helps her 4 days a week with housecleaning and driving.  Currently she has physical therapy and home health nurse checking up on her.  Manages her own day-to-day activities at home and takes her medications by herself. Mentions weight at home did not change and remains around 130 to 131 pounds. Takes furosemide  20 mg mostly every day. Denies any chest pain, palpitations, lightheadedness or dizziness.  No syncopal episodes. Denies any further falls.  Mentions bilateral lower extremity edema has improved significantly since her hospital stay in March.  Continues to wear compression stocks during the day.  Has pending workup and follow-up with gastroenterologist regarding anemia.   Blood work from 05-06-2023 with hemoglobin 8, hematocrit 26.4, WBC 6.7, platelets 268. Last CMP available to review is from  04/19/2023 with sodium 138, potassium 4 BUN 38, creatinine 1.14, EGFR 36  Last EKG available to review from 03/10/2023 noted atrial fibrillation.  Past Medical History:  Diagnosis Date   Acute lacunar infarction (HCC) 10/19/2016   Ambulatory dysfunction 06/13/2019   Anemia 07/02/2016   At high risk for injury related to fall 10/05/2016   At risk for falls 01/07/2017   Benign paroxysmal vertigo 01/07/2017   Formatting of this note might be different from the original. patient still with fluid behind the ears that may be contributing to positional changes inducing vertigo. finished last dose of antibiotics today. will add nasal sprayto see if draining inner ears will improve sinus symptoms   Bilateral carotid artery stenosis 09/22/2015   BMI 31.0-31.9,adult 05/06/2015   Breast cancer (HCC)    CAD (coronary artery disease)    per coronary CT 2023   Calculus of gallbladder and bile duct without cholecystitis 01/07/2017   Candidiasis of vulva 05/22/2021   Chronic combined systolic and diastolic heart failure (HCC) 07/17/2014   Overview:  EF 56% 09/23/10   Chronic insomnia 04/14/2018   Closed fracture of lumbar vertebra (HCC) 07/10/2008   Formatting of this note might be different from the original. Thought patient's knee pain may be referred from hip. xray of hip shows some arthritic changes, but incidentally, review of lumbar spine shows turning of the bones. believe patient's hip and knee  pain may be referred from compression of nerve in back. will request mri, and orthopedic referral   Complete rupture of rotator cuff 01/07/2017   Formatting of this note might be different from the original. patient already has her appointment scheduled for ortho for follow up   Continuous leakage of urine 10/05/2016   DDD (degenerative disc disease), cervical 01/07/2017   Formatting of this note might be different from the original. w/ Pain in R. Arm due to foraminal impingement   Diabetes mellitus with  complication (HCC)    Diabetes mellitus without complication (HCC)    Dislocation of right knee with lateral meniscus tear 03/15/2014   Ductal carcinoma in situ (DCIS) of left breast 05/06/2015   Environmental and seasonal allergies 10/05/2016   Flu vaccine need 10/05/2016   GERD (gastroesophageal reflux disease)    Gout 01/07/2017   Gouty arthropathy 01/07/2017   Formatting of this note might be different from the original. will check patient's uric acid level   Hospital discharge follow-up 11/09/2016   Hyperlipidemia 07/17/2014   Hypertension    Hypertensive heart disease with heart failure (HCC) 07/17/2014   Hypertensive urgency 09/22/2015   Hypo-osmolality and hyponatremia 05/29/2015   Formatting of this note might be different from the original. Will recheck patient's sodium level to see if it has returned to normal range --repeat labs today Formatting of this note might be different from the original. Formatting of this note might be different from the original. 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   Major depressive disorder with single episode, in full remission (HCC) 11/01/2020   Malaise and fatigue 06/13/2019   Mass of upper outer quadrant of left breast 04/21/2021   Mild CAD 07/17/2014   Overview:  50% RCA stenosis in 2005   Mixed dyslipidemia 08/25/2017   Need for vaccination against Streptococcus pneumoniae using pneumococcal conjugate vaccine 13 10/05/2016   Non morbid obesity 05/06/2015   Occlusion and stenosis of carotid artery 01/07/2017   Other specified acquired hypothyroidism 01/07/2017   Paroxysmal  atrial fibrillation (HCC) 05/22/2021   Peripheral edema 04/30/2017   Personal history of transient ischemic attack (TIA), and cerebral infarction without residual deficits 10/23/2016   Preop cardiovascular exam 01/07/2017   Formatting of this note might be different from the original.  REviewed patient's personalized wellness plan. she is current with screenings.   Pyuria 09/22/2015   Rhinosinusitis 10/14/2018   S/P arthroscopy of knee 05/17/2014   Secondary cardiomyopathy (HCC) 01/07/2017   Formatting of this note might be different from the original. EF repeat in 10/2006 was 69%   Stage 3a chronic kidney disease (HCC) 04/29/2020   Stress due to illness of family member 10/23/2016   Stroke (HCC) 02/19/2021   Stroke-like episode 05/29/2015   Stroke-like symptoms 05/29/2015   Syncope and collapse 01/07/2017   Formatting of this note might be different from the original.  will have patient stop the hydralazine , stop the probenecide, and cut her furosemide  in 1/2. Will have her check her blood pressures at home and see if her diastolic pressures will start to come back up.   Thoracic aortic atherosclerosis (HCC) 05/06/2021   TIA (  transient ischemic attack)    Uterine prolapse 07/02/2016   Vitamin D deficiency 11/01/2020   Weakness 05/22/2021    Past Surgical History:  Procedure Laterality Date   BREAST BIOPSY     CATARACT EXTRACTION     CHOLECYSTECTOMY     CORONARY ANGIOPLASTY WITH STENT PLACEMENT     EYE SURGERY     TUBAL LIGATION      Current Medications: Current Meds  Medication Sig   acetaminophen  (TYLENOL ) 325 MG tablet Take 650 mg by mouth every 4 (four) hours as needed for mild pain or moderate pain.   anastrozole  (ARIMIDEX ) 1 MG tablet Take 1 tablet (1 mg total) by mouth daily.   carvedilol (COREG) 25 MG tablet Take 25 mg by mouth 2 (two) times daily.   clopidogrel  (PLAVIX ) 75 MG tablet Take 1 tablet by mouth daily.   clorazepate (TRANXENE) 3.75 MG tablet Take 3.75 mg by  mouth daily as needed for anxiety or sleep.   cyanocobalamin  (,VITAMIN B-12,) 1000 MCG/ML injection Inject 1 mL into the muscle every 30 (thirty) days.   hydrALAZINE  (APRESOLINE ) 50 MG tablet Take 50 mg by mouth 3 (three) times daily.   metFORMIN (GLUCOPHAGE-XR) 500 MG 24 hr tablet Take 500 mg by mouth daily with breakfast. 500 mg in the morning and 1,000 at night   mirtazapine (REMERON) 15 MG tablet Take 15 mg by mouth daily.    olmesartan (BENICAR) 40 MG tablet Take 1 tablet by mouth daily.   pantoprazole  (PROTONIX ) 40 MG tablet Take 40 mg by mouth daily.   rosuvastatin  (CRESTOR ) 10 MG tablet Take 10 mg by mouth daily.   [DISCONTINUED] furosemide  (LASIX ) 20 MG tablet Take 20 mg by mouth as needed for fluid or edema.     Allergies:   Antihistamines, diphenhydramine -type; Lipitor [atorvastatin]; Loratadine; Penicillins; Allopurinol; Aspirin ; and Fenofibrate   Social History   Socioeconomic History   Marital status: Married    Spouse name: Not on file   Number of children: Not on file   Years of education: Not on file   Highest education level: Not on file  Occupational History   Not on file  Tobacco Use   Smoking status: Former    Passive exposure: Past   Smokeless tobacco: Never  Vaping Use   Vaping status: Never Used  Substance and Sexual Activity   Alcohol use: No   Drug use: No   Sexual activity: Not on file  Other Topics Concern   Not on file  Social History Narrative   Not on file   Social Drivers of Health   Financial Resource Strain: Not on file  Food Insecurity: Low Risk  (07/28/2022)   Received from [facility]   Hunger Vital Sign    Worried About Running Out of Food in the Last Year: Never true    Ran Out of Food in the Last Year: Never true  Transportation Needs: Not on file (07/28/2022)  Physical Activity: Not on file  Stress: Not on file  Social Connections: Not on file     Family History: The patient's family history includes CAD in her father and  mother; Diabetes in her father and mother; Stroke in her mother. ROS:   Please see the history of present illness.    All 14 point review of systems negative except as described per history of present illness.  EKGs/Labs/Other Studies Reviewed:    The following studies were reviewed today:   EKG:       Recent Labs:  06/11/2022: BUN 26; Creatinine, Ser 1.45; Potassium 4.5; Sodium 136 05/06/2023: Hemoglobin 8.0; Platelet Count 268  Recent Lipid Panel    Component Value Date/Time   CHOL 119 08/19/2016 1042   TRIG 107 08/19/2016 1042   HDL 41 08/19/2016 1042   CHOLHDL 3.3 05/29/2015 1549   VLDL 22 05/29/2015 1549   LDLCALC 57 08/19/2016 1042    Physical Exam:    VS:  BP (!) 140/80   Pulse 100   Ht 5' 6.5" (1.689 m)   Wt 131 lb (59.4 kg)   SpO2 98%   BMI 20.83 kg/m     Wt Readings from Last 3 Encounters:  06/03/23 131 lb (59.4 kg)  05/06/23 132 lb 8 oz (60.1 kg)  04/19/23 132 lb 3.2 oz (60 kg)     GENERAL:  Well nourished, well developed in no acute distress, thin lady appearing stated age. NECK: No JVD; No carotid bruits CARDIAC: Irregularly irregular, S1 and S2 present, no murmurs, no rubs, no gallops CHEST:  Clear to auscultation without rales, wheezing or rhonchi  Extremities: Bilateral compression socks on.  Trace bilateral ankle pitting edema.  Pulses bilaterally symmetric with radial 2+ and dorsalis pedis 2+ NEUROLOGIC:  Alert and oriented x 3  Medication Adjustments/Labs and Tests Ordered: Current medicines are reviewed at length with the patient today.  Concerns regarding medicines are outlined above.  Orders Placed This Encounter  Procedures   ECHOCARDIOGRAM COMPLETE   Meds ordered this encounter  Medications   furosemide  (LASIX ) 20 MG tablet    Sig: Take 1 tablet (20 mg total) by mouth as needed for fluid or edema.    Dispense:  90 tablet    Refill:  3    Signed, Ravis Herne reddy Jaedin Trumbo, MD, MPH, [facility], [place]. 06/03/2023 1:45 PM    [facility]

## 2023-06-03 NOTE — Assessment & Plan Note (Signed)
 Elevated coronary calcium  score 2326 on cardiac CT imaging done for prewatchman evaluation in March 2023. Subsequent Lexiscan  stress test May 2023 showed no ischemia, low risk study.  No anginal symptoms. Good baseline functional status considering her age uses a walker to ambulate.  Not on aspirin  due to side effects and intolerance. Remains on Plavix  75 mg once daily.

## 2023-06-03 NOTE — Assessment & Plan Note (Signed)
 S/p right carotid endarterectomy done through [facility]. Remains on Plavix  75 mg once daily. Remains on lipid-lowering therapy with rosuvastatin  10 mg once daily.

## 2023-06-03 NOTE — Assessment & Plan Note (Signed)
 On prior echocardiogram March 2025. Will follow-up on repeat echocardiogram.

## 2023-06-08 ENCOUNTER — Ambulatory Visit: Payer: PPO | Admitting: Cardiology

## 2023-06-29 ENCOUNTER — Ambulatory Visit

## 2023-07-06 ENCOUNTER — Telehealth: Payer: Self-pay | Admitting: Oncology

## 2023-07-06 ENCOUNTER — Inpatient Hospital Stay (HOSPITAL_BASED_OUTPATIENT_CLINIC_OR_DEPARTMENT_OTHER): Admitting: Oncology

## 2023-07-06 ENCOUNTER — Inpatient Hospital Stay: Attending: Oncology

## 2023-07-06 ENCOUNTER — Telehealth: Payer: Self-pay

## 2023-07-06 ENCOUNTER — Other Ambulatory Visit: Payer: Self-pay

## 2023-07-06 VITALS — BP 138/96 | HR 84 | Temp 98.1°F | Resp 16 | Ht 66.5 in | Wt 138.9 lb

## 2023-07-06 DIAGNOSIS — D509 Iron deficiency anemia, unspecified: Secondary | ICD-10-CM | POA: Insufficient documentation

## 2023-07-06 DIAGNOSIS — Z170 Estrogen receptor positive status [ER+]: Secondary | ICD-10-CM | POA: Insufficient documentation

## 2023-07-06 DIAGNOSIS — D500 Iron deficiency anemia secondary to blood loss (chronic): Secondary | ICD-10-CM

## 2023-07-06 DIAGNOSIS — Z79811 Long term (current) use of aromatase inhibitors: Secondary | ICD-10-CM | POA: Diagnosis not present

## 2023-07-06 DIAGNOSIS — C50912 Malignant neoplasm of unspecified site of left female breast: Secondary | ICD-10-CM | POA: Insufficient documentation

## 2023-07-06 DIAGNOSIS — D508 Other iron deficiency anemias: Secondary | ICD-10-CM

## 2023-07-06 LAB — CBC WITH DIFFERENTIAL (CANCER CENTER ONLY)
Abs Immature Granulocytes: 0.01 10*3/uL (ref 0.00–0.07)
Basophils Absolute: 0 10*3/uL (ref 0.0–0.1)
Basophils Relative: 1 %
Eosinophils Absolute: 0.1 10*3/uL (ref 0.0–0.5)
Eosinophils Relative: 1 %
HCT: 28.2 % — ABNORMAL LOW (ref 36.0–46.0)
Hemoglobin: 9 g/dL — ABNORMAL LOW (ref 12.0–15.0)
Immature Granulocytes: 0 %
Lymphocytes Relative: 9 %
Lymphs Abs: 0.5 10*3/uL — ABNORMAL LOW (ref 0.7–4.0)
MCH: 32.5 pg (ref 26.0–34.0)
MCHC: 31.9 g/dL (ref 30.0–36.0)
MCV: 101.8 fL — ABNORMAL HIGH (ref 80.0–100.0)
Monocytes Absolute: 0.4 10*3/uL (ref 0.1–1.0)
Monocytes Relative: 8 %
Neutro Abs: 4.6 10*3/uL (ref 1.7–7.7)
Neutrophils Relative %: 81 %
Platelet Count: 223 10*3/uL (ref 150–400)
RBC: 2.77 MIL/uL — ABNORMAL LOW (ref 3.87–5.11)
RDW: 18.4 % — ABNORMAL HIGH (ref 11.5–15.5)
WBC Count: 5.7 10*3/uL (ref 4.0–10.5)
nRBC: 0 % (ref 0.0–0.2)

## 2023-07-06 LAB — CMP (CANCER CENTER ONLY)
ALT: 17 U/L (ref 0–44)
AST: 22 U/L (ref 15–41)
Albumin: 4 g/dL (ref 3.5–5.0)
Alkaline Phosphatase: 79 U/L (ref 38–126)
Anion gap: 11 (ref 5–15)
BUN: 15 mg/dL (ref 8–23)
CO2: 20 mmol/L — ABNORMAL LOW (ref 22–32)
Calcium: 9.2 mg/dL (ref 8.9–10.3)
Chloride: 104 mmol/L (ref 98–111)
Creatinine: 1.13 mg/dL — ABNORMAL HIGH (ref 0.44–1.00)
GFR, Estimated: 47 mL/min — ABNORMAL LOW (ref >60)
Glucose, Bld: 167 mg/dL — ABNORMAL HIGH (ref 70–99)
Potassium: 4.4 mmol/L (ref 3.5–5.1)
Sodium: 134 mmol/L — ABNORMAL LOW (ref 135–145)
Total Bilirubin: 0.4 mg/dL (ref 0.0–1.2)
Total Protein: 6.6 g/dL (ref 6.5–8.1)

## 2023-07-06 LAB — IRON AND TIBC
Iron: 83 ug/dL (ref 28–170)
Saturation Ratios: 24 % (ref 10.4–31.8)
TIBC: 340 ug/dL (ref 250–450)
UIBC: 257 ug/dL

## 2023-07-06 LAB — VITAMIN B12: Vitamin B-12: 497 pg/mL (ref 180–914)

## 2023-07-06 LAB — FOLATE: Folate: 10 ng/mL (ref >5.9)

## 2023-07-06 LAB — FERRITIN: Ferritin: 279 ng/mL (ref 11–307)

## 2023-07-06 NOTE — Telephone Encounter (Signed)
 Latest Reference Range & Units 07/06/23 11:05  Iron  28 - 170 ug/dL 83  UIBC ug/dL 742  TIBC 749 - 549 ug/dL 659  Saturation Ratios 10.4 - 31.8 % 24  Ferritin 11 - 307 ng/mL 279  Folate >5.9 ng/mL 10.0  Vitamin B12 180 - 914 pg/mL 497

## 2023-07-06 NOTE — Progress Notes (Signed)
 [facility]  [address] [place],  [place]  [address] [phone]  Clinic Day:  07/06/2023  Referring physician: Silver Lamar LABOR, MD  HISTORY OF PRESENT ILLNESS:  The patient is an 89 y.o. 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  She currently takes anastrozole  for her adjuvant endocrine therapy.  However, the more pressing health issue she has had recently is recurrent iron  deficiency anemia.  She comes in today to reassess her iron  and hemoglobin levels after receiving another course of IV iron  in May 2025.  The patient brings to our attention that she was hospitalized last week for persistent anemia, where she underwent a colonoscopy and EGD for her recurrent iron  deficiency anemia.  Her colonoscopy revealed 8 AVMs in her cecum that were ablated.  She also received a course of IV iron  while an inpatient.  She claims to feel better today and is happy to have undergone her GI procedures last week despite some initial consternation.  She continues to deny having any overt forms of blood loss.  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  The patient elected not to undergo adjuvant breast radiation.  She briefly took tamoxifen, but discontinued it due to it causing numerous side effects, including undesirable weight gain and joint discomfort.    VITALS:  Blood pressure (!) 138/96, pulse 84, temperature 98.1 F (36.7 C), temperature source Oral, resp. rate 16, height 5' 6.5 (1.689 m), weight 138 lb 14.4 oz (63 kg), SpO2 92%.  Wt Readings from Last 3 Encounters:  07/06/23 138 lb 14.4 oz (63 kg)  06/03/23 131 lb (59.4 kg)  05/06/23 132 lb 8 oz (60.1 kg)    Body mass index is 22.08 kg/m.  Performance status (ECOG): 1 - Symptomatic but completely ambulatory  PHYSICAL EXAM:  Physical Exam Constitutional:      Appearance:  Normal appearance.  HENT:     Mouth/Throat:     Pharynx: Oropharynx is clear. No oropharyngeal exudate.   Cardiovascular:     Rate and Rhythm: Normal rate and regular rhythm.     Heart sounds: No murmur heard.    No friction rub. No gallop.  Pulmonary:     Breath sounds: Normal breath sounds.  Chest:  Breasts:    Right: No swelling, bleeding, inverted nipple, mass, nipple discharge or skin change.     Left: No swelling, bleeding, inverted nipple, mass, nipple discharge or skin change.  Abdominal:     General: Bowel sounds are normal. There is no distension.     Palpations: Abdomen is soft. There is no mass.     Tenderness: There is no abdominal tenderness.   Musculoskeletal:        General: No tenderness.     Cervical back: Normal range of motion and neck supple.     Right lower leg: No edema.     Left lower leg: No edema.  Lymphadenopathy:     Cervical: No cervical adenopathy.     Right cervical: No superficial, deep or posterior cervical adenopathy.    Left cervical: No superficial, deep or posterior cervical adenopathy.     Upper Body:     Right upper body: No supraclavicular or axillary adenopathy.     Left upper body: No supraclavicular or axillary adenopathy.     Lower Body: No right inguinal adenopathy. No  left inguinal adenopathy.   Skin:    Coloration: Skin is not jaundiced.     Findings: No lesion or rash.   Neurological:     General: No focal deficit present.     Mental Status: She is alert and oriented to person, place, and time. Mental status is at baseline.   Psychiatric:        Mood and Affect: Mood normal.        Behavior: Behavior normal.        Thought Content: Thought content normal.        Judgment: Judgment normal.   LABS:    Latest Reference Range & Units 07/06/23 11:05  WBC 4.0 - 10.5 K/uL 5.7  RBC 3.87 - 5.11 MIL/uL 2.77 (L)  Hemoglobin 12.0 - 15.0 g/dL 9.0 (L)  HCT 63.9 - 53.9 % 28.2 (L)  MCV 80.0 - 100.0 fL 101.8 (H)  MCH 26.0 - 34.0 pg  32.5  MCHC 30.0 - 36.0 g/dL 68.0  RDW 88.4 - 84.4 % 18.4 (H)  Platelets 150 - 400 K/uL 223  (L): Data is abnormally low (H): Data is abnormally high   Latest Reference Range & Units 05/06/23 09:12 07/06/23 11:05  Iron  28 - 170 ug/dL 26 (L) 83  UIBC ug/dL 539 742  TIBC 749 - 549 ug/dL 513 (H) 659  Saturation Ratios 10.4 - 31.8 % 5 (L) 24  Ferritin 11 - 307 ng/mL 18 279  Folate >5.9 ng/mL  10.0  Vitamin B12 180 - 914 pg/mL  497  (L): Data is abnormally low (H): Data is abnormally high  ASSESSMENT & PLAN:  Assessment:  An 89 y.o. 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  However, the more pressing health issue she has had recently has been persistent iron  deficiency anemia.  As mentioned previously, the patient was just hospitalized last week, where a colonoscopy revealed 8 AVMs that were ablated.  She received IV iron  in May 2025 at our facility, as well as another course of IV iron  in the hospital last week.  Her iron  parameters are clearly better.  Although her hemoglobin is still low, it is better than what it was previously.  My hopes are that the iron  that she just received last week will continue to lead to an improvement in her hemoglobin over these next weeks.  Clinically, she does look better.  I will see her back in 2 months to reassess her iron  and hemoglobin levels to see if they have improved even further.  The patient understands all the plans discussed today and is in agreement with them.  Dvora Buitron DELENA Kerns, MD

## 2023-07-06 NOTE — Telephone Encounter (Signed)
 Patient has been scheduled for follow-up visit per 07/06/23 LOS.  Pt given an appt calendar with date and time.

## 2023-07-26 ENCOUNTER — Ambulatory Visit

## 2023-07-26 DIAGNOSIS — I2510 Atherosclerotic heart disease of native coronary artery without angina pectoris: Secondary | ICD-10-CM

## 2023-07-28 LAB — ECHOCARDIOGRAM COMPLETE
AR max vel: 1.57 cm2
AV Area VTI: 1.57 cm2
AV Area mean vel: 1.4 cm2
AV Mean grad: 4 mmHg
AV Peak grad: 6.1 mmHg
Ao pk vel: 1.23 m/s
Area-P 1/2: 3.02 cm2
MV VTI: 1.21 cm2
S' Lateral: 3 cm

## 2023-09-02 ENCOUNTER — Other Ambulatory Visit: Payer: Self-pay | Admitting: Oncology

## 2023-09-06 ENCOUNTER — Inpatient Hospital Stay: Admitting: Oncology

## 2023-09-06 ENCOUNTER — Other Ambulatory Visit

## 2023-09-06 ENCOUNTER — Inpatient Hospital Stay

## 2023-09-06 ENCOUNTER — Ambulatory Visit: Admitting: Oncology

## 2023-09-07 ENCOUNTER — Other Ambulatory Visit: Payer: Self-pay

## 2023-09-07 DIAGNOSIS — C50422 Malignant neoplasm of upper-outer quadrant of left male breast: Secondary | ICD-10-CM

## 2023-09-07 MED ORDER — ANASTROZOLE 1 MG PO TABS
1.0000 mg | ORAL_TABLET | Freq: Every day | ORAL | 3 refills | Status: AC
Start: 2023-09-07 — End: ?

## 2023-09-20 NOTE — Progress Notes (Unsigned)
 [facility]  [address] [place],  [place]  [address] [phone]  Clinic Day:  09/21/2023  Referring physician: Silver Lamar LABOR, MD  HISTORY OF PRESENT ILLNESS:  The patient is an 89 y.o. 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  She currently takes anastrozole  for her adjuvant endocrine therapy.  However, the more pressing health issue she has had recently is recurrent iron  deficiency anemia.  She comes in today to reassess her iron  and hemoglobin levels after receiving another course of IV iron  in May 2025.  The patient brings to our attention that she was hospitalized last week for a hemoglobin of 5.3.  This led to her receiving 2 units of blood.  The patient continues to deny having any overt forms of blood loss.  Of note, the patient had both a colonoscopy and EGD for her recurrent iron  deficiency anemia in June 2025.  Her colonoscopy revealed 8 AVMs in her cecum that were ablated.    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  The patient elected not to undergo adjuvant breast radiation.  She briefly took tamoxifen, but discontinued it due to it causing numerous side effects, including undesirable weight gain and joint discomfort.    VITALS:  Blood pressure (!) 147/83, pulse 100, temperature 98.7 F (37.1 C), temperature source Oral, resp. rate 14, height 5' 6.5 (1.689 m), weight 137 lb 9.6 oz (62.4 kg), SpO2 100%.  Wt Readings from Last 3 Encounters:  09/21/23 137 lb 9.6 oz (62.4 kg)  07/06/23 138 lb 14.4 oz (63 kg)  06/03/23 131 lb (59.4 kg)    Body mass index is 21.88 kg/m.  Performance status (ECOG): 1 - Symptomatic but completely ambulatory  PHYSICAL EXAM:  Physical Exam Constitutional:      Appearance: Normal appearance.  HENT:     Mouth/Throat:     Pharynx: Oropharynx is clear. No oropharyngeal  exudate.  Cardiovascular:     Rate and Rhythm: Normal rate and regular rhythm.     Heart sounds: No murmur heard.    No friction rub. No gallop.  Pulmonary:     Breath sounds: Normal breath sounds.  Chest:  Breasts:    Right: No swelling, bleeding, inverted nipple, mass, nipple discharge or skin change.     Left: No swelling, bleeding, inverted nipple, mass, nipple discharge or skin change.  Abdominal:     General: Bowel sounds are normal. There is no distension.     Palpations: Abdomen is soft. There is no mass.     Tenderness: There is no abdominal tenderness.  Musculoskeletal:        General: No tenderness.     Cervical back: Normal range of motion and neck supple.     Right lower leg: No edema.     Left lower leg: No edema.  Lymphadenopathy:     Cervical: No cervical adenopathy.     Right cervical: No superficial, deep or posterior cervical adenopathy.    Left cervical: No superficial, deep or posterior cervical adenopathy.     Upper Body:     Right upper body: No supraclavicular or axillary adenopathy.     Left upper body: No supraclavicular or axillary adenopathy.     Lower Body: No right inguinal adenopathy. No left inguinal adenopathy.  Skin:    Coloration: Skin is not jaundiced.  Findings: No lesion or rash.  Neurological:     General: No focal deficit present.     Mental Status: She is alert and oriented to person, place, and time. Mental status is at baseline.  Psychiatric:        Mood and Affect: Mood normal.        Behavior: Behavior normal.        Thought Content: Thought content normal.        Judgment: Judgment normal.   LABS:    Latest Reference Range & Units 09/21/23 15:14  WBC 4.0 - 10.5 K/uL 5.4  RBC 3.87 - 5.11 MIL/uL 2.68 (L)  Hemoglobin 12.0 - 15.0 g/dL 7.8 (L)  HCT 63.9 - 53.9 % 25.1 (L)  MCV 80.0 - 100.0 fL 93.7  MCH 26.0 - 34.0 pg 29.1  MCHC 30.0 - 36.0 g/dL 68.8  RDW 88.4 - 84.4 % 14.5  Platelets 150 - 400 K/uL 234  nRBC 0.0 - 0.2 % 0.0   Neutrophils % 77  Lymphocytes % 13  Monocytes Relative % 7  Eosinophil % 2  Basophil % 1  Immature Granulocytes % 0  (L): Data is abnormally low  Latest Reference Range & Units 09/21/23 15:14  Sodium 135 - 145 mmol/L 137  Potassium 3.5 - 5.1 mmol/L 4.0  Chloride 98 - 111 mmol/L 102  CO2 22 - 32 mmol/L 21 (L)  Glucose 70 - 99 mg/dL 843 (H)  BUN 8 - 23 mg/dL 29 (H)  Creatinine 9.55 - 1.00 mg/dL 8.62 (H)  Calcium  8.9 - 10.3 mg/dL 9.2  Anion gap 5 - 15  14  Alkaline Phosphatase 38 - 126 U/L 73  Albumin 3.5 - 5.0 g/dL 4.5  AST 15 - 41 U/L 24  ALT 0 - 44 U/L 19  Total Protein 6.5 - 8.1 g/dL 7.5  Total Bilirubin 0.0 - 1.2 mg/dL 0.3  GFR, Est Non African American >60 mL/min 37 (L)  (L): Data is abnormally low (H): Data is abnormally high  Iron  studies pending  ASSESSMENT & PLAN:  Assessment:  An 89 y.o. 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  However, the more pressing health issue she has had recently has been persistent iron  deficiency anemia.  As mentioned previously, the patient received 2 units of blood less than 2 weeks ago in the hospital due to her hemoglobin falling to 5.3.  Although her hemoglobin is better today, it remains suboptimal.  Based upon this, I will arrange for her to receive another course of IV iron  before the end of the week.  As I do believe her recurrent iron  deficiency anemia is due to AVMs throughout the GI tract, I will also arrange for her to start receiving octreotide 20 mg IM every 4 weeks to see if this will curtail her persistent GI blood loss.  Both her IV iron  and octreotide will be given on the same day this week.  Her CBC will be checked every 4 weeks to see how well her octreotide is working.  I will see her back in 12 weeks for repeat clinical assessment.  The patient understands all the plans discussed today and is in agreement with them.  Mattson Dayal DELENA Kerns, MD

## 2023-09-21 ENCOUNTER — Inpatient Hospital Stay (HOSPITAL_BASED_OUTPATIENT_CLINIC_OR_DEPARTMENT_OTHER): Admitting: Oncology

## 2023-09-21 ENCOUNTER — Other Ambulatory Visit: Payer: Self-pay | Admitting: Oncology

## 2023-09-21 ENCOUNTER — Inpatient Hospital Stay: Attending: Oncology

## 2023-09-21 VITALS — BP 147/83 | HR 100 | Temp 98.7°F | Resp 14 | Ht 66.5 in | Wt 137.6 lb

## 2023-09-21 DIAGNOSIS — D500 Iron deficiency anemia secondary to blood loss (chronic): Secondary | ICD-10-CM | POA: Diagnosis not present

## 2023-09-21 DIAGNOSIS — D509 Iron deficiency anemia, unspecified: Secondary | ICD-10-CM | POA: Insufficient documentation

## 2023-09-21 DIAGNOSIS — D508 Other iron deficiency anemias: Secondary | ICD-10-CM

## 2023-09-21 DIAGNOSIS — Q2730 Arteriovenous malformation, site unspecified: Secondary | ICD-10-CM | POA: Insufficient documentation

## 2023-09-21 LAB — CMP (CANCER CENTER ONLY)
ALT: 19 U/L (ref 0–44)
AST: 24 U/L (ref 15–41)
Albumin: 4.5 g/dL (ref 3.5–5.0)
Alkaline Phosphatase: 73 U/L (ref 38–126)
Anion gap: 14 (ref 5–15)
BUN: 29 mg/dL — ABNORMAL HIGH (ref 8–23)
CO2: 21 mmol/L — ABNORMAL LOW (ref 22–32)
Calcium: 9.2 mg/dL (ref 8.9–10.3)
Chloride: 102 mmol/L (ref 98–111)
Creatinine: 1.37 mg/dL — ABNORMAL HIGH (ref 0.44–1.00)
GFR, Estimated: 37 mL/min — ABNORMAL LOW (ref >60)
Glucose, Bld: 156 mg/dL — ABNORMAL HIGH (ref 70–99)
Potassium: 4 mmol/L (ref 3.5–5.1)
Sodium: 137 mmol/L (ref 135–145)
Total Bilirubin: 0.3 mg/dL (ref 0.0–1.2)
Total Protein: 7.5 g/dL (ref 6.5–8.1)

## 2023-09-21 LAB — CBC WITH DIFFERENTIAL (CANCER CENTER ONLY)
Abs Immature Granulocytes: 0.01 K/uL (ref 0.00–0.07)
Basophils Absolute: 0 K/uL (ref 0.0–0.1)
Basophils Relative: 1 %
Eosinophils Absolute: 0.1 K/uL (ref 0.0–0.5)
Eosinophils Relative: 2 %
HCT: 25.1 % — ABNORMAL LOW (ref 36.0–46.0)
Hemoglobin: 7.8 g/dL — ABNORMAL LOW (ref 12.0–15.0)
Immature Granulocytes: 0 %
Lymphocytes Relative: 13 %
Lymphs Abs: 0.7 K/uL (ref 0.7–4.0)
MCH: 29.1 pg (ref 26.0–34.0)
MCHC: 31.1 g/dL (ref 30.0–36.0)
MCV: 93.7 fL (ref 80.0–100.0)
Monocytes Absolute: 0.4 K/uL (ref 0.1–1.0)
Monocytes Relative: 7 %
Neutro Abs: 4.1 K/uL (ref 1.7–7.7)
Neutrophils Relative %: 77 %
Platelet Count: 234 K/uL (ref 150–400)
RBC: 2.68 MIL/uL — ABNORMAL LOW (ref 3.87–5.11)
RDW: 14.5 % (ref 11.5–15.5)
WBC Count: 5.4 K/uL (ref 4.0–10.5)
nRBC: 0 % (ref 0.0–0.2)

## 2023-09-21 LAB — IRON AND TIBC
Iron: 20 ug/dL — ABNORMAL LOW (ref 28–170)
Saturation Ratios: 4 % — ABNORMAL LOW (ref 10.4–31.8)
TIBC: 505 ug/dL — ABNORMAL HIGH (ref 250–450)
UIBC: 486 ug/dL

## 2023-09-21 LAB — FOLATE: Folate: 12.4 ng/mL (ref >5.9)

## 2023-09-21 LAB — FERRITIN: Ferritin: 13 ng/mL (ref 11–307)

## 2023-09-21 LAB — VITAMIN B12: Vitamin B-12: 397 pg/mL (ref 180–914)

## 2023-09-27 ENCOUNTER — Inpatient Hospital Stay

## 2023-09-27 ENCOUNTER — Other Ambulatory Visit: Payer: Self-pay

## 2023-09-27 ENCOUNTER — Other Ambulatory Visit: Payer: Self-pay | Admitting: Hematology and Oncology

## 2023-09-27 VITALS — BP 158/64 | HR 86 | Temp 98.2°F | Resp 18

## 2023-09-27 DIAGNOSIS — D500 Iron deficiency anemia secondary to blood loss (chronic): Secondary | ICD-10-CM

## 2023-09-27 DIAGNOSIS — D509 Iron deficiency anemia, unspecified: Secondary | ICD-10-CM | POA: Diagnosis not present

## 2023-09-27 DIAGNOSIS — D508 Other iron deficiency anemias: Secondary | ICD-10-CM

## 2023-09-27 DIAGNOSIS — Q2730 Arteriovenous malformation, site unspecified: Secondary | ICD-10-CM

## 2023-09-27 LAB — SAMPLE TO BLOOD BANK

## 2023-09-27 LAB — HEMOGLOBIN AND HEMATOCRIT (CANCER CENTER ONLY)
HCT: 24.9 % — ABNORMAL LOW (ref 36.0–46.0)
Hemoglobin: 7.7 g/dL — ABNORMAL LOW (ref 12.0–15.0)

## 2023-09-27 MED ORDER — FAMOTIDINE IN NACL 20-0.9 MG/50ML-% IV SOLN
20.0000 mg | Freq: Once | INTRAVENOUS | Status: AC
  Administered 2023-09-27: 20 mg via INTRAVENOUS
  Filled 2023-09-27: qty 50

## 2023-09-27 MED ORDER — ACETAMINOPHEN 325 MG PO TABS
650.0000 mg | ORAL_TABLET | Freq: Once | ORAL | Status: AC
  Administered 2023-09-27: 650 mg via ORAL
  Filled 2023-09-27: qty 2

## 2023-09-27 MED ORDER — SODIUM CHLORIDE 0.9 % IV SOLN
1000.0000 mg | Freq: Once | INTRAVENOUS | Status: AC
  Administered 2023-09-27: 1000 mg via INTRAVENOUS
  Filled 2023-09-27: qty 10

## 2023-09-27 MED ORDER — SODIUM CHLORIDE 0.9 % IV SOLN: INTRAVENOUS | Status: DC

## 2023-09-27 NOTE — Patient Instructions (Signed)

## 2023-10-11 ENCOUNTER — Telehealth: Payer: Self-pay | Admitting: Oncology

## 2023-10-11 NOTE — Telephone Encounter (Signed)
 10/11/23 Spoke with patient and scheduled injection appt.

## 2023-10-13 ENCOUNTER — Other Ambulatory Visit: Payer: Self-pay | Admitting: Hematology and Oncology

## 2023-10-13 ENCOUNTER — Inpatient Hospital Stay: Attending: Oncology

## 2023-10-13 ENCOUNTER — Inpatient Hospital Stay

## 2023-10-13 VITALS — BP 132/94 | HR 100 | Temp 97.5°F | Resp 18

## 2023-10-13 DIAGNOSIS — K5520 Angiodysplasia of colon without hemorrhage: Secondary | ICD-10-CM | POA: Diagnosis present

## 2023-10-13 DIAGNOSIS — D508 Other iron deficiency anemias: Secondary | ICD-10-CM

## 2023-10-13 DIAGNOSIS — D500 Iron deficiency anemia secondary to blood loss (chronic): Secondary | ICD-10-CM

## 2023-10-13 DIAGNOSIS — D509 Iron deficiency anemia, unspecified: Secondary | ICD-10-CM | POA: Insufficient documentation

## 2023-10-13 DIAGNOSIS — Q2730 Arteriovenous malformation, site unspecified: Secondary | ICD-10-CM

## 2023-10-13 LAB — CBC WITH DIFFERENTIAL (CANCER CENTER ONLY)
Abs Immature Granulocytes: 0.01 K/uL (ref 0.00–0.07)
Basophils Absolute: 0.1 K/uL (ref 0.0–0.1)
Basophils Relative: 1 %
Eosinophils Absolute: 0.1 K/uL (ref 0.0–0.5)
Eosinophils Relative: 2 %
HCT: 32.4 % — ABNORMAL LOW (ref 36.0–46.0)
Hemoglobin: 10 g/dL — ABNORMAL LOW (ref 12.0–15.0)
Immature Granulocytes: 0 %
Lymphocytes Relative: 9 %
Lymphs Abs: 0.6 K/uL — ABNORMAL LOW (ref 0.7–4.0)
MCH: 29.3 pg (ref 26.0–34.0)
MCHC: 30.9 g/dL (ref 30.0–36.0)
MCV: 95 fL (ref 80.0–100.0)
Monocytes Absolute: 0.6 K/uL (ref 0.1–1.0)
Monocytes Relative: 9 %
Neutro Abs: 5.5 K/uL (ref 1.7–7.7)
Neutrophils Relative %: 79 %
Platelet Count: 201 K/uL (ref 150–400)
RBC: 3.41 MIL/uL — ABNORMAL LOW (ref 3.87–5.11)
RDW: 19.6 % — ABNORMAL HIGH (ref 11.5–15.5)
WBC Count: 6.9 K/uL (ref 4.0–10.5)
nRBC: 0 % (ref 0.0–0.2)

## 2023-10-13 MED ORDER — OCTREOTIDE ACETATE 20 MG IM KIT
20.0000 mg | PACK | Freq: Once | INTRAMUSCULAR | Status: AC
  Administered 2023-10-13: 20 mg via INTRAMUSCULAR
  Filled 2023-10-13: qty 1

## 2023-10-13 NOTE — Patient Instructions (Signed)
 Octreotide Injection Solution What is this medication? OCTREOTIDE (ok TREE oh tide) treats high levels of growth hormone (acromegaly). It works by reducing the amount of growth hormone your body makes. This reduces symptoms and the risk of health problems caused by too much growth hormone, such as diabetes and heart disease. It may also be used to treat diarrhea caused by neuroendocrine tumors. It works by slowing down the release of serotonin from the tumor cells. This reduces the number of bowel movements you have. This medicine may be used for other purposes; ask your health care provider or pharmacist if you have questions. COMMON BRAND NAME(S): Berline Lopes, Sandostatin What should I tell my care team before I take this medication? They need to know if you have any of these conditions: Diabetes Gallbladder disease Heart disease Kidney disease Liver disease Pancreatic disease Thyroid disease An unusual or allergic reaction to octreotide, other medications, foods, dyes, or preservatives Pregnant or trying to get pregnant Breastfeeding How should I use this medication? This medication is injected under the skin or into a vein. It is usually given by your care team in a hospital or clinic setting. If you get this medication at home, you will be taught how to prepare and give it. Use exactly as directed. Take it as directed on the prescription label at the same time every day. Keep taking it unless your care team tells you to stop. Allow the injection solution to come to room temperature before use. Do not warm it artificially. It is important that you put your used needles and syringes in a special sharps container. Do not put them in a trash can. If you do not have a sharps container, call your pharmacist or care team to get one. Talk to your care team about the use of this medication in children. Special care may be needed. Overdosage: If you think you have taken too much of this medicine  contact a poison control center or emergency room at once. NOTE: This medicine is only for you. Do not share this medicine with others. What if I miss a dose? If you miss a dose, take it as soon as you can. If it is almost time for your next dose, take only that dose. Do not take double or extra doses. What may interact with this medication? Bromocriptine Certain medications for blood pressure, heart disease, irregular heartbeat Cyclosporine Diuretics Medications for diabetes, including insulin Quinidine This list may not describe all possible interactions. Give your health care provider a list of all the medicines, herbs, non-prescription drugs, or dietary supplements you use. Also tell them if you smoke, drink alcohol, or use illegal drugs. Some items may interact with your medicine. What should I watch for while using this medication? Visit your care team for regular checks on your progress. Tell your care team if your symptoms do not start to get better or if they get worse. To help reduce irritation at the injection site, use a different site for each injection and make sure the solution is at room temperature before use. This medication may cause decreases in blood sugar. Signs of low blood sugar include chills, cool, pale skin or cold sweats, drowsiness, extreme hunger, fast heartbeat, headache, nausea, nervousness or anxiety, shakiness, trembling, unsteadiness, tiredness, or weakness. Contact your care team right away if you experience any of these symptoms. This medication may increase blood sugar. The risk may be higher in patients who already have diabetes. Ask your care team what you can  do to lower your risk of diabetes while taking this medication. You should make sure you get enough vitamin B12 while you are taking this medication. Discuss the foods you eat and the vitamins you take with your care team. What side effects may I notice from receiving this medication? Side effects that  you should report to your care team as soon as possible: Allergic reactions--skin rash, itching, hives, swelling of the face, lips, tongue, or throat Gallbladder problems--severe stomach pain, nausea, vomiting, fever Heart rhythm changes--fast or irregular heartbeat, dizziness, feeling faint or lightheaded, chest pain, trouble breathing High blood sugar (hyperglycemia)--increased thirst or amount of urine, unusual weakness or fatigue, blurry vision Low blood sugar (hypoglycemia)--tremors or shaking, anxiety, sweating, cold or clammy skin, confusion, dizziness, rapid heartbeat Low thyroid levels (hypothyroidism)--unusual weakness or fatigue, increased sensitivity to cold, constipation, hair loss, dry skin, weight gain, feelings of depression Low vitamin B12 level--pain, tingling, or numbness in the hands or feet, muscle weakness, dizziness, confusion, trouble concentrating Oily or light-colored stools, diarrhea, bloating, weight loss Pancreatitis--severe stomach pain that spreads to your back or gets worse after eating or when touched, fever, nausea, vomiting Slow heartbeat--dizziness, feeling faint or lightheaded, confusion, trouble breathing, unusual weakness or fatigue Side effects that usually do not require medical attention (report these to your care team if they continue or are bothersome): Diarrhea Dizziness Headache Nausea Pain, redness, or irritation at injection site Stomach pain This list may not describe all possible side effects. Call your doctor for medical advice about side effects. You may report side effects to FDA at 1-800-FDA-1088. Where should I keep my medication? Keep out of the reach of children and pets. Store in the refrigerator. Protect from light. Allow to come to room temperature naturally. Do not use artificial heat. If protected from light, the injection may be stored between 20 and 30 degrees C (70 and 86 degrees F) for 14 days. After the initial use, throw away  any unused portion of a multiple dose vial after 14 days. Get rid of any unused portions of the ampules after use. To get rid of medications that are no longer needed or have expired: Take the medication to a medication take-back program. Ask your pharmacy or law enforcement to find a location. If you cannot return the medication, ask your pharmacist or care team how to get rid of the medication safely. NOTE: This sheet is a summary. It may not cover all possible information. If you have questions about this medicine, talk to your doctor, pharmacist, or health care provider.  2024 Elsevier/Gold Standard (2022-12-11 00:00:00)

## 2023-11-09 NOTE — Progress Notes (Unsigned)
 [facility]  [address] [place],  [place]  [address] [phone]  Clinic Day:  09/21/2023  Referring physician: Silver Lamar LABOR, MD  HISTORY OF PRESENT ILLNESS:  The patient is an 89 y.o. 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  She currently takes anastrozole  for her adjuvant endocrine therapy.  However, the more pressing health issue she has had recently is recurrent iron  deficiency anemia.  She comes in today to reassess her iron  and hemoglobin levels after receiving another course of IV iron  in May 2025.  The patient brings to our attention that she was hospitalized last week for a hemoglobin of 5.3.  This led to her receiving 2 units of blood.  The patient continues to deny having any overt forms of blood loss.  Of note, the patient had both a colonoscopy and EGD for her recurrent iron  deficiency anemia in June 2025.  Her colonoscopy revealed 8 AVMs in her cecum that were ablated.    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  The patient elected not to undergo adjuvant breast radiation.  She briefly took tamoxifen, but discontinued it due to it causing numerous side effects, including undesirable weight gain and joint discomfort.    VITALS:  There were no vitals taken for this visit.  Wt Readings from Last 3 Encounters:  09/21/23 137 lb 9.6 oz (62.4 kg)  07/06/23 138 lb 14.4 oz (63 kg)  06/03/23 131 lb (59.4 kg)    There is no height or weight on file to calculate BMI.  Performance status (ECOG): 1 - Symptomatic but completely ambulatory  PHYSICAL EXAM:  Physical Exam Constitutional:      Appearance: Normal appearance.  HENT:     Mouth/Throat:     Pharynx: Oropharynx is clear. No oropharyngeal exudate.  Cardiovascular:     Rate and Rhythm: Normal rate and regular rhythm.     Heart sounds: No murmur  heard.    No friction rub. No gallop.  Pulmonary:     Breath sounds: Normal breath sounds.  Chest:  Breasts:    Right: No swelling, bleeding, inverted nipple, mass, nipple discharge or skin change.     Left: No swelling, bleeding, inverted nipple, mass, nipple discharge or skin change.  Abdominal:     General: Bowel sounds are normal. There is no distension.     Palpations: Abdomen is soft. There is no mass.     Tenderness: There is no abdominal tenderness.  Musculoskeletal:        General: No tenderness.     Cervical back: Normal range of motion and neck supple.     Right lower leg: No edema.     Left lower leg: No edema.  Lymphadenopathy:     Cervical: No cervical adenopathy.     Right cervical: No superficial, deep or posterior cervical adenopathy.    Left cervical: No superficial, deep or posterior cervical adenopathy.     Upper Body:     Right upper body: No supraclavicular or axillary adenopathy.     Left upper body: No supraclavicular or axillary adenopathy.     Lower Body: No right inguinal adenopathy. No left inguinal adenopathy.  Skin:    Coloration: Skin is not jaundiced.     Findings: No lesion or rash.  Neurological:     General: No focal deficit  present.     Mental Status: She is alert and oriented to person, place, and time. Mental status is at baseline.  Psychiatric:        Mood and Affect: Mood normal.        Behavior: Behavior normal.        Thought Content: Thought content normal.        Judgment: Judgment normal.   LABS:    Latest Reference Range & Units 09/21/23 15:14  WBC 4.0 - 10.5 K/uL 5.4  RBC 3.87 - 5.11 MIL/uL 2.68 (L)  Hemoglobin 12.0 - 15.0 g/dL 7.8 (L)  HCT 63.9 - 53.9 % 25.1 (L)  MCV 80.0 - 100.0 fL 93.7  MCH 26.0 - 34.0 pg 29.1  MCHC 30.0 - 36.0 g/dL 68.8  RDW 88.4 - 84.4 % 14.5  Platelets 150 - 400 K/uL 234  nRBC 0.0 - 0.2 % 0.0  Neutrophils % 77  Lymphocytes % 13  Monocytes Relative % 7  Eosinophil % 2  Basophil % 1  Immature  Granulocytes % 0  (L): Data is abnormally low  Latest Reference Range & Units 09/21/23 15:14  Sodium 135 - 145 mmol/L 137  Potassium 3.5 - 5.1 mmol/L 4.0  Chloride 98 - 111 mmol/L 102  CO2 22 - 32 mmol/L 21 (L)  Glucose 70 - 99 mg/dL 843 (H)  BUN 8 - 23 mg/dL 29 (H)  Creatinine 9.55 - 1.00 mg/dL 8.62 (H)  Calcium  8.9 - 10.3 mg/dL 9.2  Anion gap 5 - 15  14  Alkaline Phosphatase 38 - 126 U/L 73  Albumin 3.5 - 5.0 g/dL 4.5  AST 15 - 41 U/L 24  ALT 0 - 44 U/L 19  Total Protein 6.5 - 8.1 g/dL 7.5  Total Bilirubin 0.0 - 1.2 mg/dL 0.3  GFR, Est Non African American >60 mL/min 37 (L)  (L): Data is abnormally low (H): Data is abnormally high  Iron  studies pending  ASSESSMENT & PLAN:  Assessment:  An 89 y.o. 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  However, the more pressing health issue she has had recently has been persistent iron  deficiency anemia.  As mentioned previously, the patient received 2 units of blood less than 2 weeks ago in the hospital due to her hemoglobin falling to 5.3.  Although her hemoglobin is better today, it remains suboptimal.  Based upon this, I will arrange for her to receive another course of IV iron  before the end of the week.  As I do believe her recurrent iron  deficiency anemia is due to AVMs throughout the GI tract, I will also arrange for her to start receiving octreotide 20 mg IM every 4 weeks to see if this will curtail her persistent GI blood loss.  Both her IV iron  and octreotide will be given on the same day this week.  Her CBC will be checked every 4 weeks to see how well her octreotide is working.  I will see her back in 12 weeks for repeat clinical assessment.  The patient understands all the plans discussed today and is in agreement with them.  Quang Thorpe DELENA Kerns, MD

## 2023-11-10 ENCOUNTER — Inpatient Hospital Stay

## 2023-11-10 ENCOUNTER — Inpatient Hospital Stay: Admitting: Oncology

## 2023-11-10 ENCOUNTER — Other Ambulatory Visit: Payer: Self-pay | Admitting: Oncology

## 2023-11-10 VITALS — BP 148/62 | HR 109 | Temp 97.6°F | Resp 14 | Ht 66.5 in | Wt 136.4 lb

## 2023-11-10 DIAGNOSIS — D500 Iron deficiency anemia secondary to blood loss (chronic): Secondary | ICD-10-CM | POA: Diagnosis not present

## 2023-11-10 DIAGNOSIS — D508 Other iron deficiency anemias: Secondary | ICD-10-CM

## 2023-11-10 DIAGNOSIS — D509 Iron deficiency anemia, unspecified: Secondary | ICD-10-CM | POA: Diagnosis not present

## 2023-11-10 DIAGNOSIS — Q2730 Arteriovenous malformation, site unspecified: Secondary | ICD-10-CM

## 2023-11-10 LAB — CBC WITH DIFFERENTIAL (CANCER CENTER ONLY)
Abs Immature Granulocytes: 0.02 K/uL (ref 0.00–0.07)
Basophils Absolute: 0 K/uL (ref 0.0–0.1)
Basophils Relative: 1 %
Eosinophils Absolute: 0.2 K/uL (ref 0.0–0.5)
Eosinophils Relative: 2 %
HCT: 35 % — ABNORMAL LOW (ref 36.0–46.0)
Hemoglobin: 11.2 g/dL — ABNORMAL LOW (ref 12.0–15.0)
Immature Granulocytes: 0 %
Lymphocytes Relative: 10 %
Lymphs Abs: 0.7 K/uL (ref 0.7–4.0)
MCH: 30.4 pg (ref 26.0–34.0)
MCHC: 32 g/dL (ref 30.0–36.0)
MCV: 94.9 fL (ref 80.0–100.0)
Monocytes Absolute: 0.5 K/uL (ref 0.1–1.0)
Monocytes Relative: 7 %
Neutro Abs: 5.5 K/uL (ref 1.7–7.7)
Neutrophils Relative %: 80 %
Platelet Count: 183 K/uL (ref 150–400)
RBC: 3.69 MIL/uL — ABNORMAL LOW (ref 3.87–5.11)
RDW: 17.6 % — ABNORMAL HIGH (ref 11.5–15.5)
WBC Count: 6.8 K/uL (ref 4.0–10.5)
nRBC: 0 % (ref 0.0–0.2)

## 2023-11-10 LAB — CMP (CANCER CENTER ONLY)
ALT: 24 U/L (ref 0–44)
AST: 27 U/L (ref 15–41)
Albumin: 4.1 g/dL (ref 3.5–5.0)
Alkaline Phosphatase: 76 U/L (ref 38–126)
Anion gap: 10 (ref 5–15)
BUN: 36 mg/dL — ABNORMAL HIGH (ref 8–23)
CO2: 24 mmol/L (ref 22–32)
Calcium: 9.3 mg/dL (ref 8.9–10.3)
Chloride: 101 mmol/L (ref 98–111)
Creatinine: 1.29 mg/dL — ABNORMAL HIGH (ref 0.44–1.00)
GFR, Estimated: 40 mL/min — ABNORMAL LOW (ref >60)
Glucose, Bld: 227 mg/dL — ABNORMAL HIGH (ref 70–99)
Potassium: 4.9 mmol/L (ref 3.5–5.1)
Sodium: 136 mmol/L (ref 135–145)
Total Bilirubin: 0.4 mg/dL (ref 0.0–1.2)
Total Protein: 7 g/dL (ref 6.5–8.1)

## 2023-11-10 LAB — FERRITIN: Ferritin: 184 ng/mL (ref 11–307)

## 2023-11-10 LAB — IRON AND TIBC
Iron: 84 ug/dL (ref 28–170)
Saturation Ratios: 26 % (ref 10.4–31.8)
TIBC: 329 ug/dL (ref 250–450)
UIBC: 245 ug/dL

## 2023-11-10 MED ORDER — OCTREOTIDE ACETATE 20 MG IM KIT
20.0000 mg | PACK | Freq: Once | INTRAMUSCULAR | Status: AC
  Administered 2023-11-10: 20 mg via INTRAMUSCULAR
  Filled 2023-11-10: qty 1

## 2023-11-10 NOTE — Patient Instructions (Signed)
 Immune Globulin  Injection What is this medication? IMMUNE GLOBULIN  (im MUNE GLOB yoo lin) treats many immune system conditions. It works by Designer, multimedia extra antibodies. Antibodies are proteins made by the immune system that help protect the body. This medicine may be used for other purposes; ask your health care provider or pharmacist if you have questions. COMMON BRAND NAME(S): ASCENIV, Baygam, BIVIGAM, Carimune, Carimune NF, cutaquig, Cuvitru, Flebogamma, Flebogamma DIF, GamaSTAN, GamaSTAN S/D, Gamimune N, Gammagard, Gammagard S/D, Gammaked, Gammaplex, Gammar-P IV, Gamunex, Gamunex-C, Hizentra, Iveegam, Iveegam EN, Octagam, Panglobulin, Panglobulin NF, panzyga, Polygam S/D, Privigen , Sandoglobulin, Venoglobulin-S, Vigam, Vivaglobulin, Xembify What should I tell my care team before I take this medication? They need to know if you have any of these conditions: Blood clotting disorder Condition where you have excess fluid in your body, such as heart failure or edema Dehydration Diabetes Have had blood clots Heart disease Immune system conditions Kidney disease Low levels of IgA Recent or upcoming vaccine An unusual or allergic reaction to immune globulin , other medications, foods, dyes, or preservatives Pregnant or trying to get pregnant Breastfeeding How should I use this medication? This medication is infused into a vein or under the skin. It may also be injected into a muscle. It is usually given by your care team in a hospital or clinic setting. It may also be given at home. If you get this medication at home, you will be taught how to prepare and give it. Take it as directed on the prescription label. Keep taking it unless your care team tells you to stop. It is important that you put your used needles and syringes in a special sharps container. Do not put them in a trash can. If you do not have a sharps container, call your pharmacist or care team to get one. Talk to your care team  about the use of this medication in children. While it may be given to children for selected conditions, precautions do apply. Overdosage: If you think you have taken too much of this medicine contact a poison control center or emergency room at once. NOTE: This medicine is only for you. Do not share this medicine with others. What if I miss a dose? If you get this medication at the hospital or clinic: It is important not to miss your dose. Call your care team if you are unable to keep an appointment. If you give yourself this medication at home: If you miss a dose, take it as soon as you can. Then continue your normal schedule. If it is almost time for your next dose, take only that dose. Do not take double or extra doses. Call your care team with questions. What may interact with this medication? Live virus vaccines This list may not describe all possible interactions. Give your health care provider a list of all the medicines, herbs, non-prescription drugs, or dietary supplements you use. Also tell them if you smoke, drink alcohol , or use illegal drugs. Some items may interact with your medicine. What should I watch for while using this medication? Your condition will be monitored carefully while you are receiving this medication. Tell your care team if your symptoms do not start to get better or if they get worse. You may need blood work done while you are taking this medication. This medication increases the risk of blood clots. People with heart, blood vessel, or blood clotting conditions are more likely to develop a blood clot. Other risk factors include advanced age, estrogen use, tobacco  use, lack of movement, and being overweight. This medication can decrease the response to a vaccine. If you need to get vaccinated, tell your care team if you have received this medication within the last year. Extra booster doses may be needed. Talk to your care team to see if a different vaccination schedule  is needed. This medication is made from donated human blood. There is a small risk it may contain bacteria or viruses, such as hepatitis or HIV. All products are processed to kill most bacteria and viruses. Talk to your care team if you have questions about the risk of infection. If you have diabetes, talk to your care team about which device you should use to check your blood sugar. This medication may cause some devices to report falsely high blood sugar levels. This may cause you to react by not treating a low blood sugar level or by giving an insulin  dose that was not needed. This can cause severe low blood sugar levels. What side effects may I notice from receiving this medication? Side effects that you should report to your care team as soon as possible: Allergic reactions--skin rash, itching, hives, swelling of the face, lips, tongue, or throat Blood clot--pain, swelling, or warmth in the leg, shortness of breath, chest pain Fever, neck pain or stiffness, sensitivity to light, headache, nausea, vomiting, confusion, which may be signs of meningitis Hemolytic anemia--unusual weakness or fatigue, dizziness, headache, trouble breathing, dark urine, yellowing skin or eyes Kidney injury--decrease in the amount of urine, swelling of the ankles, hands, or feet Low sodium level--muscle weakness, fatigue, dizziness, headache, confusion Shortness of breath or trouble breathing, cough, unusual weakness or fatigue, blue skin or lips Side effects that usually do not require medical attention (report these to your care team if they continue or are bothersome): Chills Diarrhea Fever Headache Nausea This list may not describe all possible side effects. Call your doctor for medical advice about side effects. You may report side effects to FDA at 1-800-FDA-1088. Where should I keep my medication? Keep out of the reach of children and pets. You will be instructed on how to store this medication. Get rid of  any unused medication after the expiration date. To get rid of medications that are no longer needed or have expired: Take the medication to a medication take-back program. Check with your pharmacy or law enforcement to find a location. If you cannot return the medication, ask your pharmacist or care team how to get rid of this medication safely. NOTE: This sheet is a summary. It may not cover all possible information. If you have questions about this medicine, talk to your doctor, pharmacist, or health care provider.  2025 Elsevier/Gold Standard (2023-03-15 00:00:00)

## 2023-11-12 ENCOUNTER — Telehealth: Payer: Self-pay

## 2023-11-12 NOTE — Telephone Encounter (Signed)
 I spoke with pt. She reports that she has had to see eye Dr 3 times since she started the new injections. Last month, after her injection. Her eye developed dry crystal like crusting on her upper lid that caused a corneal scratch. This morning, I woke up, after shot on Wednesday, with my eye lids crusted. I had to go to eye Dr. The eye Dr told me this time there was just an abrasion, not a scratch to the cornea. The eye doctor told me he thinks that I am just too dried out. I have dry eyes anyway. I use lubricant drops. I want to know if Dr Ezzard thinks the shot could be the problem? I don't come back for 28 days, so just talk with him and get back with me next week.

## 2023-11-15 ENCOUNTER — Telehealth: Payer: Self-pay

## 2023-11-15 NOTE — Progress Notes (Unsigned)
 [facility] [address] [place],  [place]  [address] [phone]  Clinic Day:  11/16/2023  Referring physician: Silver Lamar LABOR, MD  HISTORY OF PRESENT ILLNESS:  The patient is an 89 y.o. 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  She currently takes anastrozole  for her adjuvant endocrine therapy.  She comes in off-schedule today due to abnormal breast findings.  Over the past week, she has noticed an area under her left breast that has a malodorous discharge.  As this is the same breast that has had 2 prior cancers, this finding alerted her to come in for further evaluation.  Of note, this patient did have a mammogram in August 2025, which came back unremarkable.  She is compliant with her daily anastrozole .  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  The patient elected not to undergo adjuvant breast radiation.  She briefly took tamoxifen, but discontinued it due to it causing numerous side effects, including undesirable weight gain and joint discomfort.    Another health issue she has had recently is recurrent iron  deficiency anemia secondary to GI blood loss.  She recently received IV iron  in mid September 2025 as labs showed recurrent iron  deficiency anemia.  The patient was is taking octreotide to minimize recurrent GI blood loss.  Since this has been started, the patient denies having additional episodes of GI blood loss.  Furthermore, her hemoglobin has progressively risen over time.  Of note, the patient had both a colonoscopy and EGD for her recurrent iron  deficiency anemia in June 2025.  Her colonoscopy revealed 8 AVMs in her cecum that were ablated.    VITALS:  There were no vitals taken for this visit.  Wt Readings from Last 3 Encounters:  11/10/23 136 lb 6.4 oz (61.9 kg)  09/21/23 137 lb 9.6 oz (62.4 kg)   07/06/23 138 lb 14.4 oz (63 kg)    There is no height or weight on file to calculate BMI.  Performance status (ECOG): 1 - Symptomatic but completely ambulatory  PHYSICAL EXAM:  Physical Exam Constitutional:      Appearance: Normal appearance.     Comments: She is ambulating with a walker  HENT:     Mouth/Throat:     Pharynx: Oropharynx is clear. No oropharyngeal exudate.  Cardiovascular:     Rate and Rhythm: Normal rate and regular rhythm.     Heart sounds: No murmur heard.    No friction rub. No gallop.  Pulmonary:     Breath sounds: Normal breath sounds.  Chest:  Breasts:    Right: No swelling, bleeding, inverted nipple, mass, nipple discharge or skin change.     Left: Mass (A 3 cm lesion is medial and under her left breast.  There appears to be a small subcentimeter satellite nodule just lateral to it.  There is a malodorous discharge present) present. No swelling, bleeding, inverted nipple, nipple discharge or skin change.  Abdominal:     General: Bowel sounds are normal. There is no distension.     Palpations: Abdomen is soft. There is no mass.     Tenderness: There is no abdominal tenderness.  Musculoskeletal:        General: No tenderness.     Cervical back: Normal range of motion and neck supple.     Right lower leg: No  edema.     Left lower leg: No edema.  Lymphadenopathy:     Cervical: No cervical adenopathy.     Right cervical: No superficial, deep or posterior cervical adenopathy.    Left cervical: No superficial, deep or posterior cervical adenopathy.     Upper Body:     Right upper body: No supraclavicular or axillary adenopathy.     Left upper body: No supraclavicular or axillary adenopathy.     Lower Body: No right inguinal adenopathy. No left inguinal adenopathy.  Skin:    Coloration: Skin is not jaundiced.     Findings: No lesion or rash.  Neurological:     General: No focal deficit present.     Mental Status: She is alert and oriented to person,  place, and time. Mental status is at baseline.  Psychiatric:        Mood and Affect: Mood normal.        Behavior: Behavior normal.        Thought Content: Thought content normal.        Judgment: Judgment normal.    ASSESSMENT & PLAN:  Assessment:  An 89 y.o. 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  She also has a history of hormone positive DCIS, status post a left breast lumpectomy in 2015.  She has also dealt with recurrent iron  deficiency anemia secondary to GI blood loss.  However, the patient comes in today off-schedule due to a new left breast mass.  Per my physical exam today, I am very concerned this may represent another recurrence of breast cancer.  I will have her see Dr. Bert to undergo a punch biopsy of the lesion in question.  Depending on what the results show, the patient may ultimately need a mammogram and staging scans to check for occult evidence of distant recurrence.  I will tentatively see her back in 3 weeks to go over her biopsy results and their implications.  The patient understands all the plans discussed today and is in agreement with them.  Kaj Vasil DELENA Kerns, MD

## 2023-11-15 NOTE — Telephone Encounter (Signed)
 Pt states that Saturday she was putting on her PJ's after shower. She noticed a bad odor- on examination she found leakage from left breast - clear discharge. Her nipple looks a little red and maybe a little inverted. She is requesting an appt with Dr Ezzard tomorrow to have him check it out (tomorrow as she will have a ride). Her last mammo in April 2025. Message sent to Dr Ezzard and Eleanor PIETY.

## 2023-11-16 ENCOUNTER — Inpatient Hospital Stay: Attending: Oncology | Admitting: Oncology

## 2023-11-16 ENCOUNTER — Encounter: Payer: Self-pay | Admitting: Oncology

## 2023-11-16 VITALS — BP 148/77 | HR 101 | Temp 98.2°F | Resp 16 | Ht 66.5 in | Wt 136.6 lb

## 2023-11-16 DIAGNOSIS — N6320 Unspecified lump in the left breast, unspecified quadrant: Secondary | ICD-10-CM | POA: Diagnosis not present

## 2023-11-16 DIAGNOSIS — Z79811 Long term (current) use of aromatase inhibitors: Secondary | ICD-10-CM | POA: Diagnosis not present

## 2023-11-16 DIAGNOSIS — K5521 Angiodysplasia of colon with hemorrhage: Secondary | ICD-10-CM | POA: Diagnosis present

## 2023-11-16 DIAGNOSIS — Z170 Estrogen receptor positive status [ER+]: Secondary | ICD-10-CM | POA: Diagnosis not present

## 2023-11-16 DIAGNOSIS — C50212 Malignant neoplasm of upper-inner quadrant of left female breast: Secondary | ICD-10-CM

## 2023-11-16 DIAGNOSIS — C50912 Malignant neoplasm of unspecified site of left female breast: Secondary | ICD-10-CM | POA: Diagnosis not present

## 2023-11-16 DIAGNOSIS — D500 Iron deficiency anemia secondary to blood loss (chronic): Secondary | ICD-10-CM | POA: Diagnosis present

## 2023-11-30 ENCOUNTER — Ambulatory Visit

## 2023-11-30 VITALS — BP 160/90 | HR 97 | Ht 66.5 in | Wt 138.0 lb

## 2023-11-30 DIAGNOSIS — I2510 Atherosclerotic heart disease of native coronary artery without angina pectoris: Secondary | ICD-10-CM

## 2023-11-30 DIAGNOSIS — E782 Mixed hyperlipidemia: Secondary | ICD-10-CM | POA: Diagnosis not present

## 2023-11-30 DIAGNOSIS — I6523 Occlusion and stenosis of bilateral carotid arteries: Secondary | ICD-10-CM | POA: Diagnosis not present

## 2023-11-30 DIAGNOSIS — I50812 Chronic right heart failure: Secondary | ICD-10-CM | POA: Diagnosis not present

## 2023-11-30 DIAGNOSIS — I10 Essential (primary) hypertension: Secondary | ICD-10-CM

## 2023-11-30 DIAGNOSIS — I340 Nonrheumatic mitral (valve) insufficiency: Secondary | ICD-10-CM

## 2023-11-30 DIAGNOSIS — I4821 Permanent atrial fibrillation: Secondary | ICD-10-CM

## 2023-11-30 NOTE — Assessment & Plan Note (Signed)
 Reviewed the findings from echocardiogram July 2025.  Reassuring results. Mild MR and TR are not expected to cause any significant long-term clinical effects. Will continue to monitor.

## 2023-11-30 NOTE — Assessment & Plan Note (Signed)
 S/p right carotid endarterectomy. Follows up with vascular surgeon at [facility]. Until recently was on Plavix , discontinued after visit with them 10/05/2023 after discussion about risk benefits.

## 2023-11-30 NOTE — Progress Notes (Signed)
 Cardiology Consultation:    Date:  11/30/2023   ID:  Marilyn Robinson, DOB Feb 16, 1933, MRN [id number]  PCP:  Marilyn Lamar LABOR, MD  Cardiologist:  Marilyn SAUNDERS Marilyn Meech, MD   Referring MD: Marilyn Lamar LABOR, MD   Chief Complaint  Patient presents with   Follow-up     ASSESSMENT AND PLAN:   Marilyn Robinson 89 year old woman with  history of chronic heart failure with preserved ejection fraction, elevated calcium  score 2326 March 2023 [on cardiac CT done for watchman evaluation], subsequent Lexiscan  stress test May 2023 low risk study, carotid artery disease s/p right CEA ([facility]], transthoracic echocardiogram March 2025 at [facility] low normal LVEF 50 to 55% with dilated RV and reduced function, biatrial dilatation, mild MR with severe RVSP elevation 75 mmHg, moderate to severe TR, mild to moderate pulmonary insufficiency.    Repeat echocardiogram from 07/26/2023 shows normal biventricular function with LVEF 60 to 65%, mildly dilated right ventricle, normal RVSP 27 mmHg, moderate biatrial dilatation, mild MR, mild TR.  Overall showed improved findings. Also has history of permanent atrial fibrillation s/p watchman implant 2023 not on anticoagulation, aspirin  allergy, hypertension, hyperlipidemia, diabetes mellitus, hypothyroidism, CKD stage III, CVA, GERD, breast cancer, recent unwitnessed fall at home in March 2025 during flulike illness and noted to have small intracranial hemorrhage that was stable on repeat CT imaging. Recently had left breast lump biopsied and is waiting on pathology results [follows up with Dr. Blythe, surgeon and Dr. Ezzard, Oncologist].  Here for routine follow-up visit.  Problem List Items Addressed This Visit     Bilateral carotid artery stenosis   S/p right carotid endarterectomy. Follows up with vascular surgeon at [facility]. Until recently was on Plavix , discontinued after visit with them 10/05/2023 after discussion about risk  benefits.       Chronic right heart failure (HCC) - Primary   Euvolemic and compensated. Follow-up echocardiogram results from July reviewed which showed normal biventricular function, mildly dilated RV.  Overall improvement compared to prior echocardiogram results from March 2025.  Continue with low-salt diet less than 2 g/day. Continue furosemide  milligrams to be used on an as needed basis for any significant weight gain over 2 to 3 pounds in a day or 4 to 5 pounds in a week. Has not required to use any. Weight has remained steady 137 pounds.  Continue carvedilol 25 mg twice daily. Continue hydralazine , titrate up to 3 times daily 50 mg dose given uncontrolled blood pressure. Unclear why olmesartan had been discontinued.  Will defer this to PCP.       Hyperlipidemia   Continue Crestor  10 mg once daily.       Hypertension   Suboptimal. Titrate up hydralazine  dose to 50 mg 3 times daily. Continue carvedilol 25 mg twice daily.      Permanent atrial fibrillation (HCC)   Rate controlled. Continue carvedilol 25 mg twice daily.  Elevated CHA2DS2-VASc score. S/p Watchman device implant in 2023. Not on anticoagulation.       CAD (coronary artery disease)   Elevated calcium  score 2326 on cardiac CT imaging done for prewatchman evaluation in March 2023. Subsequent Lexiscan  stress test May 2023 showed no ischemia, low risk study.   No anginal symptoms. Baseline functional status somewhat limited due to mobility.  Unable to take aspirin  due to side effects. Until recently was on Plavix  and discontinued after discussing with vascular surgeon recently on October 05, 2023. I discussed indication for Plavix  use given her history of  CVA, carotid artery disease and coronary atherosclerosis. However she mention given the bruising she had with Plavix  she wants to hold off and mentions she acknowledges the potential risk for heart attacks and strokes however given the overall risk  benefits she prefers to hold off.  She has good insight into her overall health and she is making an informed decision at this time, hence I feel this is reasonable. However if she is agreeable I would recommend staying on Plavix  75 mg once daily as she did seem to tolerate it well.        Mild mitral regurgitation   Reviewed the findings from echocardiogram July 2025.  Reassuring results. Mild MR and TR are not expected to cause any significant long-term clinical effects. Will continue to monitor.      Return to clinic in 6 months or as needed.   History of Present Illness:    Marilyn Robinson is a 89 y.o. female who is being seen today for follow-up visit. PCP Marilyn Lamar LABOR, MD.  Last visit with me in the office was 06/03/2023.   history of chronic heart failure with preserved ejection fraction, elevated calcium  score 2326 March 2023 [on cardiac CT done for watchman evaluation], subsequent Lexiscan  stress test May 2023 low risk study, carotid artery disease s/p right CEA ([facility]], transthoracic echocardiogram March 2025 at [facility] low normal LVEF 50 to 55% with dilated RV and reduced function, biatrial dilatation, mild MR with severe RVSP elevation 75 mmHg, moderate to severe TR, mild to moderate pulmonary insufficiency.    Repeat echocardiogram from 07/26/2023 shows normal biventricular function with LVEF 60 to 65%, mildly dilated right ventricle, normal RVSP 27 mmHg, moderate biatrial dilatation, mild MR, mild TR.  Overall showed improved findings. Also has history of permanent atrial fibrillation s/p watchman implant 2023 not on anticoagulation, aspirin  allergy, hypertension, hyperlipidemia, diabetes mellitus, hypothyroidism, CKD stage III, CVA, GERD, breast cancer, recent unwitnessed fall at home in March 2025 during flulike illness and noted to have small intracranial hemorrhage that was stable on repeat CT imaging. Recently had left breast lump  biopsied and is waiting on pathology results [follows up with Dr. Blythe, surgeon and Dr. Ezzard, Oncologist].  Pleasant woman here for the visit by herself.  Lives at home by herself.  She has a person help her out 5 days a week with housekeeping and transportation.  Her daughter lives a couple blocks away and available to help as needed. She takes her medications by herself and manages well.  Mentions she does not routinely check her blood pressures at home. Takes hydralazine  2 times a day once in the afternoon closer to supper and once before bedtime around 11 PM. Blood pressures over the last few office visits have been elevated Recheck blood pressure here in the office today systolic 160 mmHg.  Overall she reports doing well.  Ambulates using her walker. Denies any falls or lightheadedness or syncopal episodes. Denies any chest pain, shortness of breath, orthopnea or paroxysmal nocturnal dyspnea.  Denies any blood in urine or stools.  Mentions she discontinued Plavix  after follow-up visit with vascular surgeon on 10/05/2023.  She mentions she was having significant bruising with Plavix  and wanted to hold off.  Last blood work to review is from 11/10/2023 hemoglobin 11.2, hematocrit 35, platelets 183, WBC 6.8 Patient CMP with sodium 136, potassium 4.9 BUN 36, creatinine 1.29, eGFR 40 Normal transaminases and alkaline phosphatase   Past Medical History:  Diagnosis Date  Acute lacunar infarction [facility]) 10/19/2016   Ambulatory dysfunction 06/13/2019   Anemia 07/02/2016   At high risk for injury related to fall 10/05/2016   At risk for falls 01/07/2017   Benign paroxysmal vertigo 01/07/2017   Formatting of this note might be different from the original. patient still with fluid behind the ears that may be contributing to positional changes inducing vertigo. finished last dose of antibiotics today. will add nasal sprayto see if draining inner ears will improve sinus symptoms    Bilateral carotid artery stenosis 09/22/2015   BMI 31.0-31.9,adult 05/06/2015   Breast cancer (HCC)    CAD (coronary artery disease)    per coronary CT 2023   Calculus of gallbladder and bile duct without cholecystitis 01/07/2017   Candidiasis of vulva 05/22/2021   Chronic combined systolic and diastolic heart failure (HCC) 07/17/2014   Overview:  EF 56% 09/23/10   Chronic insomnia 04/14/2018   Closed fracture of lumbar vertebra (HCC) 07/10/2008   Formatting of this note might be different from the original. Thought patient's knee pain may be referred from hip. xray of hip shows some arthritic changes, but incidentally, review of lumbar spine shows turning of the bones. believe patient's hip and knee pain may be referred from compression of nerve in back. will request mri, and orthopedic referral   Complete rupture of rotator cuff 01/07/2017   Formatting of this note might be different from the original. patient already has her appointment scheduled for ortho for follow up   Continuous leakage of urine 10/05/2016   DDD (degenerative disc disease), cervical 01/07/2017   Formatting of this note might be different from the original. w/ Pain in R. Arm due to foraminal impingement   Diabetes mellitus with complication (HCC)    Diabetes mellitus without complication (HCC)    Dislocation of right knee with lateral meniscus tear 03/15/2014   Ductal carcinoma in situ (DCIS) of left breast 05/06/2015   Environmental and seasonal allergies 10/05/2016   Flu vaccine need 10/05/2016   GERD (gastroesophageal reflux disease)    Gout 01/07/2017   Gouty arthropathy 01/07/2017   Formatting of this note might be different from the original. will check patient's uric acid level   Hospital discharge follow-up 11/09/2016   Hyperlipidemia 07/17/2014   Hypertension    Hypertensive heart disease with heart failure (HCC) 07/17/2014   Hypertensive urgency 09/22/2015   Hypo-osmolality and hyponatremia 05/29/2015    Formatting of this note might be different from the original. Will recheck patient's sodium level to see if it has returned to normal range --repeat labs today Formatting of this note might be different from the original. Formatting of this note might be different from the original. 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   Major depressive disorder with single episode, in full remission 11/01/2020   Malaise and fatigue 06/13/2019   Mass of upper outer quadrant of left breast 04/21/2021   Mild CAD 07/17/2014   Overview:  50% RCA stenosis in 2005   Mixed dyslipidemia 08/25/2017   Need for vaccination against Streptococcus pneumoniae using pneumococcal conjugate vaccine 13 10/05/2016   Non morbid obesity 05/06/2015   Occlusion  and stenosis of carotid artery 01/07/2017   Other specified acquired hypothyroidism 01/07/2017   Paroxysmal atrial fibrillation (HCC) 05/22/2021   Peripheral edema 04/30/2017   Personal history of transient ischemic attack (TIA), and cerebral infarction without residual deficits 10/23/2016   Preop cardiovascular exam 01/07/2017   Formatting of this note might be different from the original.  REviewed patient's personalized wellness plan. she is current with screenings.   Pyuria 09/22/2015   Rhinosinusitis 10/14/2018   S/P arthroscopy of knee 05/17/2014   Secondary cardiomyopathy (HCC) 01/07/2017   Formatting of this note might be different from the original. EF repeat in 10/2006 was 69%   Stage 3a chronic kidney disease (HCC) 04/29/2020   Stress due to illness of family member 10/23/2016   Stroke (HCC)  02/19/2021   Stroke-like episode 05/29/2015   Stroke-like symptoms 05/29/2015   Syncope and collapse 01/07/2017   Formatting of this note might be different from the original.  will have patient stop the hydralazine , stop the probenecide, and cut her furosemide  in 1/2. Will have her check her blood pressures at home and see if her diastolic pressures will start to come back up.   Thoracic aortic atherosclerosis 05/06/2021   TIA (transient ischemic attack)    Uterine prolapse 07/02/2016   Vitamin D deficiency 11/01/2020   Weakness 05/22/2021    Past Surgical History:  Procedure Laterality Date   BREAST BIOPSY     CATARACT EXTRACTION     CHOLECYSTECTOMY     CORONARY ANGIOPLASTY WITH STENT PLACEMENT     EYE SURGERY     TUBAL LIGATION      Current Medications: Current Meds  Medication Sig   acetaminophen  (TYLENOL ) 325 MG tablet Take 650 mg by mouth every 4 (four) hours as needed for mild pain or moderate pain.   anastrozole  (ARIMIDEX ) 1 MG tablet Take 1 tablet (1 mg total) by mouth daily.   carvedilol (COREG) 25 MG tablet Take 25 mg by mouth 2 (two) times daily.   cyanocobalamin  (,VITAMIN B-12,) 1000 MCG/ML injection Inject 1 mL into the muscle every 30 (thirty) days.   hydrALAZINE  (APRESOLINE ) 50 MG tablet Take 50 mg by mouth 3 (three) times daily.   metFORMIN (GLUCOPHAGE-XR) 500 MG 24 hr tablet Take 500 mg by mouth daily with breakfast. 500 mg in the morning and 1,000 at night   mirtazapine (REMERON) 15 MG tablet Take 15 mg by mouth daily.    pantoprazole  (PROTONIX ) 40 MG tablet Take 40 mg by mouth daily.   rosuvastatin  (CRESTOR ) 10 MG tablet Take 10 mg by mouth daily.     Allergies:   Antihistamines, diphenhydramine -type; Cetirizine; Ezetimibe; Lipitor [atorvastatin]; Loratadine; Penicillins; Allopurinol; Aspirin ; and Fenofibrate   Social History   Socioeconomic History   Marital status: Married    Spouse name: Not on file   Number of children: Not on file   Years of  education: Not on file   Highest education level: Not on file  Occupational History   Not on file  Tobacco Use   Smoking status: Former    Passive exposure: Past   Smokeless tobacco: Never  Vaping Use   Vaping status: Never Used  Substance and Sexual Activity   Alcohol use: No   Drug use: No   Sexual activity: Not on file  Other Topics Concern   Not on file  Social History Narrative   Not on file   Social Drivers of Health   Financial Resource Strain: Not on file  Food Insecurity: Low  Risk  (11/23/2023)   Received from [facility]   Hunger Vital Sign    Within the past 12 months, the food you bought just didn't last and you didn't have money to get more. : Never true    Within the past 12 months, you worried that your food would run out before you got money to buy more: Never true  Transportation Needs: No Transportation Needs (11/23/2023)   Received from [facility]    In the past 12 months, has lack of reliable transportation kept you from medical appointments, meetings, work or from getting things needed for daily living? : No  Physical Activity: Not on file  Stress: Not on file  Social Connections: Not on file     Family History: The patient's family history includes CAD in her father and mother; Diabetes in her father and mother; Stroke in her mother. ROS:   Please see the history of present illness.    All 14 point review of systems negative except as described per history of present illness.  EKGs/Labs/Other Studies Reviewed:    The following studies were reviewed today:   EKG:       Recent Labs: 11/10/2023: ALT 24; BUN 36; Creatinine 1.29; Hemoglobin 11.2; Platelet Count 183; Potassium 4.9; Sodium 136  Recent Lipid Panel    Component Value Date/Time   CHOL 119 08/19/2016 1042   TRIG 107 08/19/2016 1042   HDL 41 08/19/2016 1042   CHOLHDL 3.3 05/29/2015 1549   VLDL 22 05/29/2015 1549   LDLCALC 57 08/19/2016 1042    Physical  Exam:    VS:  BP (!) 160/90   Pulse 97   Ht 5' 6.5 (1.689 m)   Wt 138 lb (62.6 kg)   SpO2 98%   BMI 21.94 kg/m     Wt Readings from Last 3 Encounters:  11/30/23 138 lb (62.6 kg)  11/16/23 136 lb 9.6 oz (62 kg)  11/10/23 136 lb 6.4 oz (61.9 kg)     GENERAL:  Well nourished, well developed in no acute distress NECK: No JVD; No carotid bruits CARDIAC: RRR, S1 and S2 present, no murmurs, no rubs, no gallops CHEST:  Clear to auscultation without rales, wheezing or rhonchi  Extremities: No pitting pedal edema. Pulses bilaterally symmetric with radial 2+ and dorsalis pedis 2+ NEUROLOGIC:  Alert and oriented x 3  Medication Adjustments/Labs and Tests Ordered: Current medicines are reviewed at length with the patient today.  Concerns regarding medicines are outlined above.  No orders of the defined types were placed in this encounter.  No orders of the defined types were placed in this encounter.   Signed, Marilyn jess Kobus, MD, MPH, [facility]. 11/30/2023 4:03 PM    [facility]

## 2023-11-30 NOTE — Assessment & Plan Note (Signed)
 Suboptimal. Titrate up hydralazine  dose to 50 mg 3 times daily. Continue carvedilol 25 mg twice daily.

## 2023-11-30 NOTE — Patient Instructions (Signed)
 Medication Instructions:  Your physician recommends that you continue on your current medications as directed. Please refer to the Current Medication list given to you today.  *If you need a refill on your cardiac medications before your next appointment, please call your pharmacy*   Lab Work: None Ordered If you have labs (blood work) drawn today and your tests are completely normal, you will receive your results only by: MyChart Message (if you have MyChart) OR A paper copy in the mail If you have any lab test that is abnormal or we need to change your treatment, we will call you to review the results.   Testing/Procedures: None Ordered   Follow-Up: At [facility], you and your health needs are our priority.  As part of our continuing mission to provide you with exceptional heart care, we have created designated Provider Care Teams.  These Care Teams include your primary Cardiologist (physician) and Advanced Practice Providers (APPs -  Physician Assistants and Nurse Practitioners) who all work together to provide you with the care you need, when you need it.  We recommend signing up for the patient portal called "MyChart".  Sign up information is provided on this After Visit Summary.  MyChart is used to connect with patients for Virtual Visits (Telemedicine).  Patients are able to view lab/test results, encounter notes, upcoming appointments, etc.  Non-urgent messages can be sent to your provider as well.   To learn more about what you can do with MyChart, go to [email or web address].    Your next appointment:   6 month(s)  The format for your next appointment:   In Person  Provider:   Huntley Dec, MD    Other Instructions NA

## 2023-11-30 NOTE — Assessment & Plan Note (Addendum)
 Euvolemic and compensated. Follow-up echocardiogram results from July reviewed which showed normal biventricular function, mildly dilated RV.  Overall improvement compared to prior echocardiogram results from March 2025.  Continue with low-salt diet less than 2 g/day. Continue furosemide  milligrams to be used on an as needed basis for any significant weight gain over 2 to 3 pounds in a day or 4 to 5 pounds in a week. Has not required to use any. Weight has remained steady 137 pounds.  Continue carvedilol 25 mg twice daily. Continue hydralazine , titrate up to 3 times daily 50 mg dose given uncontrolled blood pressure. Unclear why olmesartan had been discontinued.  Will defer this to PCP.

## 2023-11-30 NOTE — Assessment & Plan Note (Signed)
Continue Crestor 10 mg once daily. 

## 2023-11-30 NOTE — Assessment & Plan Note (Signed)
 Rate controlled. Continue carvedilol 25 mg twice daily.  Elevated CHA2DS2-VASc score. S/p Watchman device implant in 2023. Not on anticoagulation.

## 2023-11-30 NOTE — Assessment & Plan Note (Signed)
 Elevated calcium  score 2326 on cardiac CT imaging done for prewatchman evaluation in March 2023. Subsequent Lexiscan  stress test May 2023 showed no ischemia, low risk study.   No anginal symptoms. Baseline functional status somewhat limited due to mobility.  Unable to take aspirin  due to side effects. Until recently was on Plavix  and discontinued after discussing with vascular surgeon recently on October 05, 2023. I discussed indication for Plavix  use given her history of CVA, carotid artery disease and coronary atherosclerosis. However she mention given the bruising she had with Plavix  she wants to hold off and mentions she acknowledges the potential risk for heart attacks and strokes however given the overall risk benefits she prefers to hold off.  She has good insight into her overall health and she is making an informed decision at this time, hence I feel this is reasonable. However if she is agreeable I would recommend staying on Plavix  75 mg once daily as she did seem to tolerate it well.

## 2023-12-07 NOTE — Progress Notes (Unsigned)
 [facility] at [facility] [address] [place],  [place]  [address] [phone]  Clinic Day:  11/16/2023  Referring physician: Silver Lamar LABOR, MD  HISTORY OF PRESENT ILLNESS:  The patient is an 89 y.o. female who comes in today to go over the biopsy results of the skin lesion over her left breast, which was noticed at her last visit.  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  She currently takes anastrozole  for her adjuvant endocrine therapy.    She was initially diagnosed with hormone positive DCIS, status post a lumpectomy in May 2015.  The patient elected not to undergo adjuvant breast radiation.  She briefly took tamoxifen, but discontinued it due to it causing numerous side effects, including undesirable weight gain and joint discomfort.    Another health issue she has had recently is recurrent iron  deficiency anemia secondary to GI blood loss.  She recently received IV iron  in mid September 2025 as labs showed recurrent iron  deficiency anemia.  The patient was is taking octreotide  to minimize recurrent GI blood loss.  Since this has been started, the patient denies having additional episodes of GI blood loss.  Furthermore, her hemoglobin has progressively risen over time.  Of note, the patient had both a colonoscopy and EGD for her recurrent iron  deficiency anemia in June 2025.  Her colonoscopy revealed 8 AVMs in her cecum that were ablated.    VITALS:  Blood pressure (!) 157/77, pulse 90, temperature 98 F (36.7 C), temperature source Oral, resp. rate 14, height 5' 6.5 (1.689 m), weight 137 lb 1.6 oz (62.2 kg), SpO2 99%.  Wt Readings from Last 3 Encounters:  12/08/23 137 lb 1.6 oz (62.2 kg)  11/30/23 138 lb (62.6 kg)  11/16/23 136 lb 9.6 oz (62 kg)    Body mass index is 21.8 kg/m.  Performance status (ECOG): 1 - Symptomatic but completely ambulatory  PHYSICAL EXAM:   Physical Exam Constitutional:      Appearance: Normal appearance.     Comments: She is ambulating with a walker  HENT:     Mouth/Throat:     Pharynx: Oropharynx is clear. No oropharyngeal exudate.  Cardiovascular:     Rate and Rhythm: Normal rate and regular rhythm.     Heart sounds: No murmur heard.    No friction rub. No gallop.  Pulmonary:     Breath sounds: Normal breath sounds.  Chest:  Breasts:    Right: No swelling, bleeding, inverted nipple, mass, nipple discharge or skin change.     Left: Mass (A 3 cm lesion is medial and under her left breast.  There appears to be a small subcentimeter satellite nodule just lateral to it.  There is a malodorous discharge present) present. No swelling, bleeding, inverted nipple, nipple discharge or skin change.  Abdominal:     General: Bowel sounds are normal. There is no distension.     Palpations: Abdomen is soft. There is no mass.     Tenderness: There is no abdominal tenderness.  Musculoskeletal:        General: No tenderness.     Cervical back: Normal range of motion and neck supple.     Right lower leg: No edema.     Left lower leg: No edema.  Lymphadenopathy:     Cervical: No cervical adenopathy.     Right cervical: No superficial, deep or posterior cervical  adenopathy.    Left cervical: No superficial, deep or posterior cervical adenopathy.     Upper Body:     Right upper body: No supraclavicular or axillary adenopathy.     Left upper body: No supraclavicular or axillary adenopathy.     Lower Body: No right inguinal adenopathy. No left inguinal adenopathy.  Skin:    Coloration: Skin is not jaundiced.     Findings: No lesion or rash.  Neurological:     General: No focal deficit present.     Mental Status: She is alert and oriented to person, place, and time. Mental status is at baseline.  Psychiatric:        Mood and Affect: Mood normal.        Behavior: Behavior normal.        Thought Content: Thought content normal.         Judgment: Judgment normal.   PATHOLOGY: A. SKIN, LEFT BREAST, BIOPSY:              Invasive carcinoma, favor breast primary origin.               Focal features suspicious for dermal lymphovascular invasion.              See comment.     Electronically signed by Wilkie Cristie Bobo, MD on 11/29/2023 at 1125 EST  Comment   The patient's history of breast carcinoma is noted. Sections demonstrate an infiltrative population of epithelioid cells arranged in nests and cords within the dermis. These atypical cells displays irregular nuclear contours and moderate nuclear pleomorphism consistent with carcinoma.  No definitive involvement of the overlying epidermis is identified. Focal features suspicious for dermal lymphovascular invasion are seen.    Immunohistochemical studies were performed. These neoplastic cells show positive staining for CK7 and GATA3.    Overall, these changes are consistent with an invasive carcinoma. The observed morphologic and immunohistochemical findings favor breast primary origin. Correlation with clinical and imagine findings is advised.    ASSESSMENT & PLAN:  Assessment:  An 89 y.o. female who unfortunately has recurrent left breast cancer.  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  She also has a history of hormone positive DCIS, status post a left breast lumpectomy in 2015.  For completeness, I will have the patient undergo CT scans of her chest/abdomen/pelvis to ensure her recurrence is only local.  If so, the patient knows that she will likely need a left mastectomy.  If her scans unexpectedly show disease metastasis, then a more palliative approach would be used moving forward for her disease management.  I will see her back in 1 week to go over her CT scans and their implications.  With respect to her iron  deficiency anemia that  has been related to cecal AVMs, she  will continue to receive octreotide  every 4 weeks, including today.  The patient understands all the plans discussed today and is in agreement with them.  Talin Feister DELENA Kerns, MD

## 2023-12-08 ENCOUNTER — Inpatient Hospital Stay

## 2023-12-08 ENCOUNTER — Telehealth: Payer: Self-pay | Admitting: Oncology

## 2023-12-08 ENCOUNTER — Other Ambulatory Visit: Payer: Self-pay | Admitting: Oncology

## 2023-12-08 ENCOUNTER — Ambulatory Visit

## 2023-12-08 ENCOUNTER — Inpatient Hospital Stay (HOSPITAL_BASED_OUTPATIENT_CLINIC_OR_DEPARTMENT_OTHER): Admitting: Oncology

## 2023-12-08 VITALS — BP 157/77 | HR 90 | Temp 98.0°F | Resp 14 | Ht 66.5 in | Wt 137.1 lb

## 2023-12-08 DIAGNOSIS — C50212 Malignant neoplasm of upper-inner quadrant of left female breast: Secondary | ICD-10-CM

## 2023-12-08 DIAGNOSIS — C50312 Malignant neoplasm of lower-inner quadrant of left female breast: Secondary | ICD-10-CM | POA: Diagnosis not present

## 2023-12-08 DIAGNOSIS — Q2730 Arteriovenous malformation, site unspecified: Secondary | ICD-10-CM

## 2023-12-08 DIAGNOSIS — D500 Iron deficiency anemia secondary to blood loss (chronic): Secondary | ICD-10-CM

## 2023-12-08 DIAGNOSIS — D508 Other iron deficiency anemias: Secondary | ICD-10-CM

## 2023-12-08 DIAGNOSIS — Z170 Estrogen receptor positive status [ER+]: Secondary | ICD-10-CM | POA: Diagnosis not present

## 2023-12-08 LAB — CMP (CANCER CENTER ONLY)
ALT: 16 U/L (ref 0–44)
AST: 24 U/L (ref 15–41)
Albumin: 4 g/dL (ref 3.5–5.0)
Alkaline Phosphatase: 77 U/L (ref 38–126)
Anion gap: 16 — ABNORMAL HIGH (ref 5–15)
BUN: 29 mg/dL — ABNORMAL HIGH (ref 8–23)
CO2: 22 mmol/L (ref 22–32)
Calcium: 9.4 mg/dL (ref 8.9–10.3)
Chloride: 95 mmol/L — ABNORMAL LOW (ref 98–111)
Creatinine: 1.36 mg/dL — ABNORMAL HIGH (ref 0.44–1.00)
GFR, Estimated: 37 mL/min — ABNORMAL LOW (ref >60)
Glucose, Bld: 335 mg/dL — ABNORMAL HIGH (ref 70–99)
Potassium: 4.5 mmol/L (ref 3.5–5.1)
Sodium: 133 mmol/L — ABNORMAL LOW (ref 135–145)
Total Bilirubin: 0.4 mg/dL (ref 0.0–1.2)
Total Protein: 7.2 g/dL (ref 6.5–8.1)

## 2023-12-08 LAB — CBC WITH DIFFERENTIAL (CANCER CENTER ONLY)
Abs Immature Granulocytes: 0.02 K/uL (ref 0.00–0.07)
Basophils Absolute: 0 K/uL (ref 0.0–0.1)
Basophils Relative: 1 %
Eosinophils Absolute: 0.1 K/uL (ref 0.0–0.5)
Eosinophils Relative: 2 %
HCT: 35.4 % — ABNORMAL LOW (ref 36.0–46.0)
Hemoglobin: 11.5 g/dL — ABNORMAL LOW (ref 12.0–15.0)
Immature Granulocytes: 0 %
Lymphocytes Relative: 9 %
Lymphs Abs: 0.6 K/uL — ABNORMAL LOW (ref 0.7–4.0)
MCH: 30.5 pg (ref 26.0–34.0)
MCHC: 32.5 g/dL (ref 30.0–36.0)
MCV: 93.9 fL (ref 80.0–100.0)
Monocytes Absolute: 0.5 K/uL (ref 0.1–1.0)
Monocytes Relative: 8 %
Neutro Abs: 5.4 K/uL (ref 1.7–7.7)
Neutrophils Relative %: 80 %
Platelet Count: 222 K/uL (ref 150–400)
RBC: 3.77 MIL/uL — ABNORMAL LOW (ref 3.87–5.11)
RDW: 16.7 % — ABNORMAL HIGH (ref 11.5–15.5)
WBC Count: 6.8 K/uL (ref 4.0–10.5)
nRBC: 0 % (ref 0.0–0.2)

## 2023-12-08 LAB — IRON AND TIBC
Iron: 86 ug/dL (ref 28–170)
Saturation Ratios: 26 % (ref 10.4–31.8)
TIBC: 335 ug/dL (ref 250–450)
UIBC: 249 ug/dL

## 2023-12-08 LAB — FERRITIN: Ferritin: 195 ng/mL (ref 11–307)

## 2023-12-08 MED ORDER — OCTREOTIDE ACETATE 20 MG IM KIT
20.0000 mg | PACK | Freq: Once | INTRAMUSCULAR | Status: AC
  Administered 2023-12-08: 20 mg via INTRAMUSCULAR
  Filled 2023-12-08: qty 1

## 2023-12-08 NOTE — Telephone Encounter (Signed)
 Patient has been scheduled for follow-up visit per 12/08/2023 LOS.  Pt aware of scheduled appt details.

## 2023-12-08 NOTE — Patient Instructions (Signed)
 Octreotide Injection Solution What is this medication? OCTREOTIDE (ok TREE oh tide) treats high levels of growth hormone (acromegaly). It works by reducing the amount of growth hormone your body makes. This reduces symptoms and the risk of health problems caused by too much growth hormone, such as diabetes and heart disease. It may also be used to treat diarrhea caused by neuroendocrine tumors. It works by slowing down the release of serotonin from the tumor cells. This reduces the number of bowel movements you have. This medicine may be used for other purposes; ask your health care provider or pharmacist if you have questions. COMMON BRAND NAME(S): Berline Lopes, Sandostatin What should I tell my care team before I take this medication? They need to know if you have any of these conditions: Diabetes Gallbladder disease Heart disease Kidney disease Liver disease Pancreatic disease Thyroid disease An unusual or allergic reaction to octreotide, other medications, foods, dyes, or preservatives Pregnant or trying to get pregnant Breastfeeding How should I use this medication? This medication is injected under the skin or into a vein. It is usually given by your care team in a hospital or clinic setting. If you get this medication at home, you will be taught how to prepare and give it. Use exactly as directed. Take it as directed on the prescription label at the same time every day. Keep taking it unless your care team tells you to stop. Allow the injection solution to come to room temperature before use. Do not warm it artificially. It is important that you put your used needles and syringes in a special sharps container. Do not put them in a trash can. If you do not have a sharps container, call your pharmacist or care team to get one. Talk to your care team about the use of this medication in children. Special care may be needed. Overdosage: If you think you have taken too much of this medicine  contact a poison control center or emergency room at once. NOTE: This medicine is only for you. Do not share this medicine with others. What if I miss a dose? If you miss a dose, take it as soon as you can. If it is almost time for your next dose, take only that dose. Do not take double or extra doses. What may interact with this medication? Bromocriptine Certain medications for blood pressure, heart disease, irregular heartbeat Cyclosporine Diuretics Medications for diabetes, including insulin Quinidine This list may not describe all possible interactions. Give your health care provider a list of all the medicines, herbs, non-prescription drugs, or dietary supplements you use. Also tell them if you smoke, drink alcohol, or use illegal drugs. Some items may interact with your medicine. What should I watch for while using this medication? Visit your care team for regular checks on your progress. Tell your care team if your symptoms do not start to get better or if they get worse. To help reduce irritation at the injection site, use a different site for each injection and make sure the solution is at room temperature before use. This medication may cause decreases in blood sugar. Signs of low blood sugar include chills, cool, pale skin or cold sweats, drowsiness, extreme hunger, fast heartbeat, headache, nausea, nervousness or anxiety, shakiness, trembling, unsteadiness, tiredness, or weakness. Contact your care team right away if you experience any of these symptoms. This medication may increase blood sugar. The risk may be higher in patients who already have diabetes. Ask your care team what you can  do to lower your risk of diabetes while taking this medication. You should make sure you get enough vitamin B12 while you are taking this medication. Discuss the foods you eat and the vitamins you take with your care team. What side effects may I notice from receiving this medication? Side effects that  you should report to your care team as soon as possible: Allergic reactions--skin rash, itching, hives, swelling of the face, lips, tongue, or throat Gallbladder problems--severe stomach pain, nausea, vomiting, fever Heart rhythm changes--fast or irregular heartbeat, dizziness, feeling faint or lightheaded, chest pain, trouble breathing High blood sugar (hyperglycemia)--increased thirst or amount of urine, unusual weakness or fatigue, blurry vision Low blood sugar (hypoglycemia)--tremors or shaking, anxiety, sweating, cold or clammy skin, confusion, dizziness, rapid heartbeat Low thyroid levels (hypothyroidism)--unusual weakness or fatigue, increased sensitivity to cold, constipation, hair loss, dry skin, weight gain, feelings of depression Low vitamin B12 level--pain, tingling, or numbness in the hands or feet, muscle weakness, dizziness, confusion, trouble concentrating Oily or light-colored stools, diarrhea, bloating, weight loss Pancreatitis--severe stomach pain that spreads to your back or gets worse after eating or when touched, fever, nausea, vomiting Slow heartbeat--dizziness, feeling faint or lightheaded, confusion, trouble breathing, unusual weakness or fatigue Side effects that usually do not require medical attention (report these to your care team if they continue or are bothersome): Diarrhea Dizziness Headache Nausea Pain, redness, or irritation at injection site Stomach pain This list may not describe all possible side effects. Call your doctor for medical advice about side effects. You may report side effects to FDA at 1-800-FDA-1088. Where should I keep my medication? Keep out of the reach of children and pets. Store in the refrigerator. Protect from light. Allow to come to room temperature naturally. Do not use artificial heat. If protected from light, the injection may be stored between 20 and 30 degrees C (70 and 86 degrees F) for 14 days. After the initial use, throw away  any unused portion of a multiple dose vial after 14 days. Get rid of any unused portions of the ampules after use. To get rid of medications that are no longer needed or have expired: Take the medication to a medication take-back program. Ask your pharmacy or law enforcement to find a location. If you cannot return the medication, ask your pharmacist or care team how to get rid of the medication safely. NOTE: This sheet is a summary. It may not cover all possible information. If you have questions about this medicine, talk to your doctor, pharmacist, or health care provider.  2024 Elsevier/Gold Standard (2022-12-11 00:00:00)

## 2023-12-14 ENCOUNTER — Ambulatory Visit: Admitting: Oncology

## 2023-12-14 ENCOUNTER — Other Ambulatory Visit

## 2023-12-14 ENCOUNTER — Ambulatory Visit (INDEPENDENT_AMBULATORY_CARE_PROVIDER_SITE_OTHER)
Admission: RE | Admit: 2023-12-14 | Discharge: 2023-12-14 | Disposition: A | Source: Ambulatory Visit | Attending: Oncology | Admitting: Oncology

## 2023-12-14 DIAGNOSIS — C50212 Malignant neoplasm of upper-inner quadrant of left female breast: Secondary | ICD-10-CM

## 2023-12-14 DIAGNOSIS — Z170 Estrogen receptor positive status [ER+]: Secondary | ICD-10-CM

## 2023-12-14 NOTE — Progress Notes (Unsigned)
 [facility] at [facility] [address] [place],  [place]  [address] [phone]  Clinic Day:  12/15/2023  Referring physician: Silver Lamar LABOR, MD  HISTORY OF PRESENT ILLNESS:  The patient is an 89 y.o. female who was biopsy-proven to have recurrent breast cancer in her left breast.  She comes in today to go over her CT scans to ensure there is no occult evidence of disease metastasis.  Since her last visit, the patient has been doing well.  She denies there being any significant drainage from her new focus of breast cancer.  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  She has been taking anastrozole  for her adjuvant endocrine therapy.  She was initially diagnosed with hormone positive DCIS, status post a lumpectomy in May 2015.  The patient elected not to undergo adjuvant breast radiation.  She briefly took tamoxifen, but discontinued it due to it causing numerous side effects, including undesirable weight gain and joint discomfort.    Another health issue she has had recently is recurrent iron  deficiency anemia secondary to GI blood loss.  She recently received IV iron  in mid September 2025 as labs showed recurrent iron  deficiency anemia.  The patient is taking octreotide  to minimize recurrent GI blood loss.  Since this has been started, the patient denies having additional episodes of GI blood loss.  Furthermore, her hemoglobin has progressively risen over time.  Of note, the patient had both a colonoscopy and EGD for her recurrent iron  deficiency anemia in June 2025.  Her colonoscopy revealed 8 AVMs in her cecum that were ablated.    VITALS:  Blood pressure (!) 146/121, pulse (!) 112, temperature 98.3 F (36.8 C), temperature source Oral, resp. rate 16, height 5' 6.5 (1.689 m), weight 137 lb 8 oz (62.4 kg), SpO2 98%.  Wt Readings from Last 3 Encounters:  12/15/23 137 lb 8 oz (62.4  kg)  12/08/23 137 lb 1.6 oz (62.2 kg)  11/30/23 138 lb (62.6 kg)    Body mass index is 21.86 kg/m.  Performance status (ECOG): 1 - Symptomatic but completely ambulatory  PHYSICAL EXAM:  Physical Exam Constitutional:      Appearance: Normal appearance.     Comments: She is ambulating with a walker  HENT:     Mouth/Throat:     Pharynx: Oropharynx is clear. No oropharyngeal exudate.  Cardiovascular:     Rate and Rhythm: Normal rate and regular rhythm.     Heart sounds: No murmur heard.    No friction rub. No gallop.  Pulmonary:     Breath sounds: Normal breath sounds.  Chest:  Breasts:    Right: No swelling, bleeding, inverted nipple, mass, nipple discharge or skin change.     Left: Mass (A 3 cm lesion is medial and under her left breast.  There appears to be a small subcentimeter satellite nodule just lateral to it.  There is a malodorous discharge present) present. No swelling, bleeding, inverted nipple, nipple discharge or skin change.  Abdominal:     General: Bowel sounds are normal. There is no distension.     Palpations: Abdomen is soft. There is no mass.     Tenderness: There is no abdominal tenderness.  Musculoskeletal:        General: No tenderness.     Cervical back: Normal range of motion and neck supple.     Right lower leg:  No edema.     Left lower leg: No edema.  Lymphadenopathy:     Cervical: No cervical adenopathy.     Right cervical: No superficial, deep or posterior cervical adenopathy.    Left cervical: No superficial, deep or posterior cervical adenopathy.     Upper Body:     Right upper body: No supraclavicular or axillary adenopathy.     Left upper body: No supraclavicular or axillary adenopathy.     Lower Body: No right inguinal adenopathy. No left inguinal adenopathy.  Skin:    Coloration: Skin is not jaundiced.     Findings: No lesion or rash.  Neurological:     General: No focal deficit present.     Mental Status: She is alert and oriented to  person, place, and time. Mental status is at baseline.  Psychiatric:        Mood and Affect: Mood normal.        Behavior: Behavior normal.        Thought Content: Thought content normal.        Judgment: Judgment normal.   SCANS:  CT scans of her chest/abdomen/pelvis on 12/14/2023 revealed the following: FINDINGS: CT CHEST FINDINGS   Cardiovascular: Severe atherosclerosis of the aorta, great vessels and coronary arteries again noted. There is mild central enlargement of the pulmonary arteries. Left atrial appendage occlusive device noted. Probable calcifications of the aortic valve. The heart size is normal. There is no pericardial effusion.   Mediastinum/Nodes: No enlarged mediastinal, hilar, axillary or internal mammary lymph nodes are identified. Scattered small mediastinal lymph nodes appear unchanged. Hilar assessment is limited by the lack of intravenous contrast, although the hilar contours appear unchanged. The thyroid  gland, trachea and esophagus demonstrate no significant findings.   Lungs/Pleura: No pleural effusion or pneumothorax. Moderate centrilobular and paraseptal emphysema with diffuse central airway thickening and scattered pulmonary scarring. No confluent airspace disease or suspicious nodularity.   Musculoskeletal/Chest wall: New subcutaneous nodule medially in the left breast measuring 2.2 x 1.5 cm on image 30/301, which may correspond with the palpable concern. Mild asymmetric skin thickening in the left breast. No other chest wall masses are identified. No evidence of acute fracture or aggressive osseous lesion.   CT ABDOMEN AND PELVIS FINDINGS   Hepatobiliary: The liver has a non cirrhotic morphology without suspicious focal abnormality on noncontrast imaging. No evidence of significant biliary dilatation status post cholecystectomy.   Pancreas: Diffuse atrophy and fatty replacement. No ductal dilatation or surrounding inflammation identified.    Spleen: Normal in size without focal abnormality.   Adrenals/Urinary Tract: Both adrenal glands appear normal. Renovascular calcifications bilaterally with possible nonobstructing bilateral renal calculi. No evidence of ureteral calculus, hydronephrosis or perinephric soft tissue stranding. The bladder appears normal for its degree of distention.   Stomach/Bowel: No enteric contrast administered. The stomach appears unremarkable for its degree of distension. No evidence of bowel wall thickening, distention or surrounding inflammatory change. The appendix appears normal. Diverticular changes throughout the distal colon and mildly prominent colonic stool.   Vascular/Lymphatic: There are no enlarged abdominal or pelvic lymph nodes. Diffuse aortic and branch vessel atherosclerosis without evidence of aneurysm.   Reproductive: The uterus and adnexal regions appear stable. No adnexal mass.   Other: No evidence of abdominal wall mass or hernia. No ascites or pneumoperitoneum.   Musculoskeletal: Chronic L1 and L3 compression fractures and multilevel spondylosis. No acute osseous findings or aggressive osseous lesions. There is a new subcutaneous nodule in the left buttocks measuring 2.3  cm on image 98/301. This appears new from 09/12/2023 and could relate to subcutaneous injections. There is asymmetric left gluteus muscular atrophy.   IMPRESSION: 1. New subcutaneous nodule medially in the left breast, possibly corresponding with the palpable concern. This could reflect a metastasis or primary breast malignancy. Correlate with physical examination and dedicated breast imaging. 2. New nonspecific subcutaneous nodule in the left buttocks, possibly related to subcutaneous injections. 3. No other evidence of metastatic disease in the chest, abdomen or pelvis. 4. Aortic Atherosclerosis (ICD10-I70.0) and Emphysema (ICD10-J43.9).   PATHOLOGY: A. SKIN, LEFT BREAST, BIOPSY:               Invasive carcinoma, favor breast primary origin.               Focal features suspicious for dermal lymphovascular invasion.              See comment.     Electronically signed by Wilkie Cristie Bobo, MD on 11/29/2023 at 1125 EST  Comment   The patient's history of breast carcinoma is noted. Sections demonstrate an infiltrative population of epithelioid cells arranged in nests and cords within the dermis. These atypical cells displays irregular nuclear contours and moderate nuclear pleomorphism consistent with carcinoma.  No definitive involvement of the overlying epidermis is identified. Focal features suspicious for dermal lymphovascular invasion are seen.    Immunohistochemical studies were performed. These neoplastic cells show positive staining for CK7 and GATA3.    Overall, these changes are consistent with an invasive carcinoma. The observed morphologic and immunohistochemical findings favor breast primary origin. Correlation with clinical and imagine findings is advised.    ASSESSMENT & PLAN:  Assessment:  An 89 y.o. female who unfortunately has recurrent left breast cancer.  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  She also has a history of hormone positive DCIS, status post a left breast lumpectomy in 2015.  In clinic today, I went over all of her CT scan images with her, for which she could see that she does not have any evidence of metastatic disease.  Understandably, the patient was pleased with her scan results.  I will refer her to Dr. Bert, who will perform a left breast mastectomy.  I will tentatively see her back in 1 month to go over the surgical pathology from this upcoming surgery, which will be used to formulate her next course of action.  With respect to her iron  deficiency anemia that has been related to cecal AVMs, she will continue to receive octreotide  every 4 weeks.  The  patient understands all the plans discussed today and is in agreement with them.  Lorretta Kerce DELENA Kerns, MD

## 2023-12-15 ENCOUNTER — Inpatient Hospital Stay: Attending: Oncology | Admitting: Oncology

## 2023-12-15 VITALS — BP 146/121 | HR 112 | Temp 98.3°F | Resp 16 | Ht 66.5 in | Wt 137.5 lb

## 2023-12-15 DIAGNOSIS — C50319 Malignant neoplasm of lower-inner quadrant of unspecified female breast: Secondary | ICD-10-CM | POA: Diagnosis not present

## 2023-12-15 DIAGNOSIS — K5521 Angiodysplasia of colon with hemorrhage: Secondary | ICD-10-CM | POA: Diagnosis present

## 2023-12-15 DIAGNOSIS — C50912 Malignant neoplasm of unspecified site of left female breast: Secondary | ICD-10-CM | POA: Diagnosis present

## 2023-12-15 DIAGNOSIS — Z170 Estrogen receptor positive status [ER+]: Secondary | ICD-10-CM | POA: Diagnosis not present

## 2023-12-15 DIAGNOSIS — D500 Iron deficiency anemia secondary to blood loss (chronic): Secondary | ICD-10-CM | POA: Insufficient documentation

## 2023-12-22 ENCOUNTER — Telehealth: Payer: Self-pay

## 2023-12-22 NOTE — Telephone Encounter (Signed)
 Pt has called to ask if it is ok for her to take her Sandostatin  injection on the 01/05/24, as she is having left breast mastectomy on 01/07/2024 (Dr Bert)? Message sent ot Dr Ezzard and [facility].   Devere Phy,RPH:  I don't see an issue with it. We could give it a couple of days earlier (22nd) if pt prefers more time in between.

## 2023-12-29 DIAGNOSIS — I517 Cardiomegaly: Secondary | ICD-10-CM | POA: Diagnosis not present

## 2023-12-29 DIAGNOSIS — I361 Nonrheumatic tricuspid (valve) insufficiency: Secondary | ICD-10-CM | POA: Diagnosis not present

## 2023-12-31 ENCOUNTER — Telehealth: Payer: Self-pay

## 2023-12-31 ENCOUNTER — Telehealth: Payer: Self-pay | Admitting: Hematology and Oncology

## 2023-12-31 NOTE — Telephone Encounter (Signed)
 Patients daughter called to make us  aware her mother is in [facility] , presented with stroke like symptoms stroke ruled out per family . Patient to be discharged to rehab facility Monday or Tuesday of next week. Patient also started on blood thinners in the hospital and has mastectomy scheduled next week  01-07-2024 with Dr Bert. Patients daughter Slater will reach of to Dr Pincus office and make them aware of her health changes and blood thinners . We will cancel appt here for Monday since patient will still be in the hosp and resch. Dr Ezzard aware.

## 2023-12-31 NOTE — Telephone Encounter (Signed)
 12/31/23 Mammogram on 02/22/24@1pm -RH Patient has been scheduled for follow-up visit per 12/31/2023 LOS.  Pt given an appt calendar with date and time.

## 2024-01-03 ENCOUNTER — Inpatient Hospital Stay

## 2024-01-05 ENCOUNTER — Inpatient Hospital Stay

## 2024-01-13 ENCOUNTER — Encounter: Payer: Self-pay | Admitting: Oncology

## 2024-01-19 NOTE — Progress Notes (Unsigned)
 "  [facility] [address] [place],  [place]  [address] [phone]  Clinic Day:  12/15/2023  Referring physician: Silver Lamar LABOR, MD  HISTORY OF PRESENT ILLNESS:  The patient is an 89 y.o. female who was biopsy-proven to have recurrent left breast cancer.  The plan was for her to undergo a completion mastectomy.  However, the patient was admitted for a recent stroke.  Scans showed evidence of an acute left cerebellar stroke.     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  She has been taking anastrozole  for her adjuvant endocrine therapy.  She was initially diagnosed with hormone positive DCIS, status post a lumpectomy in May 2015.  The patient elected not to undergo adjuvant breast radiation.  She briefly took tamoxifen, but discontinued it due to it causing numerous side effects, including undesirable weight gain and joint discomfort.    Another health issue she has had recently is recurrent iron  deficiency anemia secondary to GI blood loss.  She recently received IV iron  in mid September 2025 as labs showed recurrent iron  deficiency anemia.  The patient is taking octreotide  to minimize recurrent GI blood loss.  Since this has been started, the patient denies having additional episodes of GI blood loss.  Furthermore, her hemoglobin has progressively risen over time.  Of note, the patient had both a colonoscopy and EGD for her recurrent iron  deficiency anemia in June 2025.  Her colonoscopy revealed 8 AVMs in her cecum that were ablated.    VITALS:  There were no vitals taken for this visit.  Wt Readings from Last 3 Encounters:  12/15/23 137 lb 8 oz (62.4 kg)  12/08/23 137 lb 1.6 oz (62.2 kg)  11/30/23 138 lb (62.6 kg)    There is no height or weight on file to calculate BMI.  Performance status (ECOG): 1 - Symptomatic but completely ambulatory  PHYSICAL EXAM:   Physical Exam Constitutional:      Appearance: Normal appearance.     Comments: She is ambulating with a walker  HENT:     Mouth/Throat:     Pharynx: Oropharynx is clear. No oropharyngeal exudate.  Cardiovascular:     Rate and Rhythm: Normal rate and regular rhythm.     Heart sounds: No murmur heard.    No friction rub. No gallop.  Pulmonary:     Breath sounds: Normal breath sounds.  Chest:  Breasts:    Right: No swelling, bleeding, inverted nipple, mass, nipple discharge or skin change.     Left: Mass (A 3 cm lesion is medial and under her left breast.  There appears to be a small subcentimeter satellite nodule just lateral to it.  There is a malodorous discharge present) present. No swelling, bleeding, inverted nipple, nipple discharge or skin change.  Abdominal:     General: Bowel sounds are normal. There is no distension.     Palpations: Abdomen is soft. There is no mass.     Tenderness: There is no abdominal tenderness.  Musculoskeletal:        General: No tenderness.     Cervical back: Normal range of motion and neck supple.     Right lower leg: No edema.     Left lower leg: No edema.  Lymphadenopathy:     Cervical: No cervical adenopathy.     Right cervical: No superficial, deep or posterior cervical adenopathy.  Left cervical: No superficial, deep or posterior cervical adenopathy.     Upper Body:     Right upper body: No supraclavicular or axillary adenopathy.     Left upper body: No supraclavicular or axillary adenopathy.     Lower Body: No right inguinal adenopathy. No left inguinal adenopathy.  Skin:    Coloration: Skin is not jaundiced.     Findings: No lesion or rash.  Neurological:     General: No focal deficit present.     Mental Status: She is alert and oriented to person, place, and time. Mental status is at baseline.  Psychiatric:        Mood and Affect: Mood normal.        Behavior: Behavior normal.        Thought Content: Thought content normal.         Judgment: Judgment normal.   SCANS:  CT scans of her chest/abdomen/pelvis on 12/14/2023 revealed the following: FINDINGS: CT CHEST FINDINGS   Cardiovascular: Severe atherosclerosis of the aorta, great vessels and coronary arteries again noted. There is mild central enlargement of the pulmonary arteries. Left atrial appendage occlusive device noted. Probable calcifications of the aortic valve. The heart size is normal. There is no pericardial effusion.   Mediastinum/Nodes: No enlarged mediastinal, hilar, axillary or internal mammary lymph nodes are identified. Scattered small mediastinal lymph nodes appear unchanged. Hilar assessment is limited by the lack of intravenous contrast, although the hilar contours appear unchanged. The thyroid  gland, trachea and esophagus demonstrate no significant findings.   Lungs/Pleura: No pleural effusion or pneumothorax. Moderate centrilobular and paraseptal emphysema with diffuse central airway thickening and scattered pulmonary scarring. No confluent airspace disease or suspicious nodularity.   Musculoskeletal/Chest wall: New subcutaneous nodule medially in the left breast measuring 2.2 x 1.5 cm on image 30/301, which may correspond with the palpable concern. Mild asymmetric skin thickening in the left breast. No other chest wall masses are identified. No evidence of acute fracture or aggressive osseous lesion.   CT ABDOMEN AND PELVIS FINDINGS   Hepatobiliary: The liver has a non cirrhotic morphology without suspicious focal abnormality on noncontrast imaging. No evidence of significant biliary dilatation status post cholecystectomy.   Pancreas: Diffuse atrophy and fatty replacement. No ductal dilatation or surrounding inflammation identified.   Spleen: Normal in size without focal abnormality.   Adrenals/Urinary Tract: Both adrenal glands appear normal. Renovascular calcifications bilaterally with possible nonobstructing bilateral  renal calculi. No evidence of ureteral calculus, hydronephrosis or perinephric soft tissue stranding. The bladder appears normal for its degree of distention.   Stomach/Bowel: No enteric contrast administered. The stomach appears unremarkable for its degree of distension. No evidence of bowel wall thickening, distention or surrounding inflammatory change. The appendix appears normal. Diverticular changes throughout the distal colon and mildly prominent colonic stool.   Vascular/Lymphatic: There are no enlarged abdominal or pelvic lymph nodes. Diffuse aortic and branch vessel atherosclerosis without evidence of aneurysm.   Reproductive: The uterus and adnexal regions appear stable. No adnexal mass.   Other: No evidence of abdominal wall mass or hernia. No ascites or pneumoperitoneum.   Musculoskeletal: Chronic L1 and L3 compression fractures and multilevel spondylosis. No acute osseous findings or aggressive osseous lesions. There is a new subcutaneous nodule in the left buttocks measuring 2.3 cm on image 98/301. This appears new from 09/12/2023 and could relate to subcutaneous injections. There is asymmetric left gluteus muscular atrophy.   IMPRESSION: 1. New subcutaneous nodule medially in the left breast, possibly corresponding with  the palpable concern. This could reflect a metastasis or primary breast malignancy. Correlate with physical examination and dedicated breast imaging. 2. New nonspecific subcutaneous nodule in the left buttocks, possibly related to subcutaneous injections. 3. No other evidence of metastatic disease in the chest, abdomen or pelvis. 4. Aortic Atherosclerosis (ICD10-I70.0) and Emphysema (ICD10-J43.9).   PATHOLOGY: A. SKIN, LEFT BREAST, BIOPSY:              Invasive carcinoma, favor breast primary origin.               Focal features suspicious for dermal lymphovascular invasion.              See comment.     Electronically signed by Wilkie Cristie Bobo, MD on 11/29/2023 at 1125 EST  Comment   The patient's history of breast carcinoma is noted. Sections demonstrate an infiltrative population of epithelioid cells arranged in nests and cords within the dermis. These atypical cells displays irregular nuclear contours and moderate nuclear pleomorphism consistent with carcinoma.  No definitive involvement of the overlying epidermis is identified. Focal features suspicious for dermal lymphovascular invasion are seen.    Immunohistochemical studies were performed. These neoplastic cells show positive staining for CK7 and GATA3.    Overall, these changes are consistent with an invasive carcinoma. The observed morphologic and immunohistochemical findings favor breast primary origin. Correlation with clinical and imagine findings is advised.    ASSESSMENT & PLAN:  Assessment:  An 89 y.o. female who unfortunately has recurrent left breast cancer.  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  She also has a history of hormone positive DCIS, status post a left breast lumpectomy in 2015.  In clinic today, I went over all of her CT scan images with her, for which she could see that she does not have any evidence of metastatic disease.  Understandably, the patient was pleased with her scan results.  I will refer her to Dr. Bert, who will perform a left breast mastectomy.  I will tentatively see her back in 1 month to go over the surgical pathology from this upcoming surgery, which will be used to formulate her next course of action.  With respect to her iron  deficiency anemia that has been related to cecal AVMs, she will continue to receive octreotide  every 4 weeks.  The patient understands all the plans discussed today and is in agreement with them.  Dory Demont DELENA Kerns, MD     "

## 2024-01-20 ENCOUNTER — Inpatient Hospital Stay: Attending: Oncology | Admitting: Oncology

## 2024-01-20 ENCOUNTER — Telehealth: Payer: Self-pay | Admitting: Oncology

## 2024-01-20 DIAGNOSIS — D500 Iron deficiency anemia secondary to blood loss (chronic): Secondary | ICD-10-CM | POA: Insufficient documentation

## 2024-01-20 DIAGNOSIS — Z8673 Personal history of transient ischemic attack (TIA), and cerebral infarction without residual deficits: Secondary | ICD-10-CM | POA: Insufficient documentation

## 2024-01-20 DIAGNOSIS — C50912 Malignant neoplasm of unspecified site of left female breast: Secondary | ICD-10-CM | POA: Insufficient documentation

## 2024-01-20 DIAGNOSIS — K5521 Angiodysplasia of colon with hemorrhage: Secondary | ICD-10-CM | POA: Insufficient documentation

## 2024-01-20 NOTE — Telephone Encounter (Signed)
 Contacted pt to reschedule missed appt from 01/20/24. Unable to reach via phone, voicemail was left.

## 2024-01-25 ENCOUNTER — Other Ambulatory Visit: Payer: Self-pay | Admitting: Oncology

## 2024-01-25 ENCOUNTER — Telehealth: Payer: Self-pay | Admitting: Oncology

## 2024-01-25 DIAGNOSIS — D508 Other iron deficiency anemias: Secondary | ICD-10-CM

## 2024-01-25 NOTE — Progress Notes (Incomplete)
 "  [facility] [address] [place],  [place]  [address] [phone]  Clinic Day:  12/15/2023  Referring physician: Silver Lamar LABOR, MD  HISTORY OF PRESENT ILLNESS:  The patient is an 89 y.o. female who was biopsy-proven to have recurrent left breast cancer.  The plan was for her to undergo a completion mastectomy.  However, the patient was admitted for a recent stroke.  Scans showed evidence of an acute left cerebellar stroke.     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  She has been taking anastrozole  for her adjuvant endocrine therapy.  She was initially diagnosed with hormone positive DCIS, status post a lumpectomy in May 2015.  The patient elected not to undergo adjuvant breast radiation.  She briefly took tamoxifen, but discontinued it due to it causing numerous side effects, including undesirable weight gain and joint discomfort.    Another health issue she has had recently is recurrent iron  deficiency anemia secondary to GI blood loss.  She recently received IV iron  in mid September 2025 as labs showed recurrent iron  deficiency anemia.  The patient is taking octreotide  to minimize recurrent GI blood loss.  Since this has been started, the patient denies having additional episodes of GI blood loss.  Furthermore, her hemoglobin has progressively risen over time.  Of note, the patient had both a colonoscopy and EGD for her recurrent iron  deficiency anemia in June 2025.  Her colonoscopy revealed 8 AVMs in her cecum that were ablated.    VITALS:  There were no vitals taken for this visit.  Wt Readings from Last 3 Encounters:  12/15/23 137 lb 8 oz (62.4 kg)  12/08/23 137 lb 1.6 oz (62.2 kg)  11/30/23 138 lb (62.6 kg)    There is no height or weight on file to calculate BMI.  Performance status (ECOG): 1 - Symptomatic but completely ambulatory  PHYSICAL EXAM:   Physical Exam Constitutional:      Appearance: Normal appearance.     Comments: She is ambulating with a walker  HENT:     Mouth/Throat:     Pharynx: Oropharynx is clear. No oropharyngeal exudate.  Cardiovascular:     Rate and Rhythm: Normal rate and regular rhythm.     Heart sounds: No murmur heard.    No friction rub. No gallop.  Pulmonary:     Breath sounds: Normal breath sounds.  Chest:  Breasts:    Right: No swelling, bleeding, inverted nipple, mass, nipple discharge or skin change.     Left: Mass (A 3 cm lesion is medial and under her left breast.  There appears to be a small subcentimeter satellite nodule just lateral to it.  There is a malodorous discharge present) present. No swelling, bleeding, inverted nipple, nipple discharge or skin change.  Abdominal:     General: Bowel sounds are normal. There is no distension.     Palpations: Abdomen is soft. There is no mass.     Tenderness: There is no abdominal tenderness.  Musculoskeletal:        General: No tenderness.     Cervical back: Normal range of motion and neck supple.     Right lower leg: No edema.     Left lower leg: No edema.  Lymphadenopathy:     Cervical: No cervical adenopathy.     Right cervical: No superficial, deep or posterior cervical adenopathy.  Left cervical: No superficial, deep or posterior cervical adenopathy.     Upper Body:     Right upper body: No supraclavicular or axillary adenopathy.     Left upper body: No supraclavicular or axillary adenopathy.     Lower Body: No right inguinal adenopathy. No left inguinal adenopathy.  Skin:    Coloration: Skin is not jaundiced.     Findings: No lesion or rash.  Neurological:     General: No focal deficit present.     Mental Status: She is alert and oriented to person, place, and time. Mental status is at baseline.  Psychiatric:        Mood and Affect: Mood normal.        Behavior: Behavior normal.        Thought Content: Thought content normal.         Judgment: Judgment normal.   SCANS:  CT scans of her chest/abdomen/pelvis on 12/14/2023 revealed the following: FINDINGS: CT CHEST FINDINGS   Cardiovascular: Severe atherosclerosis of the aorta, great vessels and coronary arteries again noted. There is mild central enlargement of the pulmonary arteries. Left atrial appendage occlusive device noted. Probable calcifications of the aortic valve. The heart size is normal. There is no pericardial effusion.   Mediastinum/Nodes: No enlarged mediastinal, hilar, axillary or internal mammary lymph nodes are identified. Scattered small mediastinal lymph nodes appear unchanged. Hilar assessment is limited by the lack of intravenous contrast, although the hilar contours appear unchanged. The thyroid  gland, trachea and esophagus demonstrate no significant findings.   Lungs/Pleura: No pleural effusion or pneumothorax. Moderate centrilobular and paraseptal emphysema with diffuse central airway thickening and scattered pulmonary scarring. No confluent airspace disease or suspicious nodularity.   Musculoskeletal/Chest wall: New subcutaneous nodule medially in the left breast measuring 2.2 x 1.5 cm on image 30/301, which may correspond with the palpable concern. Mild asymmetric skin thickening in the left breast. No other chest wall masses are identified. No evidence of acute fracture or aggressive osseous lesion.   CT ABDOMEN AND PELVIS FINDINGS   Hepatobiliary: The liver has a non cirrhotic morphology without suspicious focal abnormality on noncontrast imaging. No evidence of significant biliary dilatation status post cholecystectomy.   Pancreas: Diffuse atrophy and fatty replacement. No ductal dilatation or surrounding inflammation identified.   Spleen: Normal in size without focal abnormality.   Adrenals/Urinary Tract: Both adrenal glands appear normal. Renovascular calcifications bilaterally with possible nonobstructing bilateral  renal calculi. No evidence of ureteral calculus, hydronephrosis or perinephric soft tissue stranding. The bladder appears normal for its degree of distention.   Stomach/Bowel: No enteric contrast administered. The stomach appears unremarkable for its degree of distension. No evidence of bowel wall thickening, distention or surrounding inflammatory change. The appendix appears normal. Diverticular changes throughout the distal colon and mildly prominent colonic stool.   Vascular/Lymphatic: There are no enlarged abdominal or pelvic lymph nodes. Diffuse aortic and branch vessel atherosclerosis without evidence of aneurysm.   Reproductive: The uterus and adnexal regions appear stable. No adnexal mass.   Other: No evidence of abdominal wall mass or hernia. No ascites or pneumoperitoneum.   Musculoskeletal: Chronic L1 and L3 compression fractures and multilevel spondylosis. No acute osseous findings or aggressive osseous lesions. There is a new subcutaneous nodule in the left buttocks measuring 2.3 cm on image 98/301. This appears new from 09/12/2023 and could relate to subcutaneous injections. There is asymmetric left gluteus muscular atrophy.   IMPRESSION: 1. New subcutaneous nodule medially in the left breast, possibly corresponding with  the palpable concern. This could reflect a metastasis or primary breast malignancy. Correlate with physical examination and dedicated breast imaging. 2. New nonspecific subcutaneous nodule in the left buttocks, possibly related to subcutaneous injections. 3. No other evidence of metastatic disease in the chest, abdomen or pelvis. 4. Aortic Atherosclerosis (ICD10-I70.0) and Emphysema (ICD10-J43.9).   PATHOLOGY: A. SKIN, LEFT BREAST, BIOPSY:              Invasive carcinoma, favor breast primary origin.               Focal features suspicious for dermal lymphovascular invasion.              See comment.     Electronically signed by Wilkie Cristie Bobo, MD on 11/29/2023 at 1125 EST  Comment   The patient's history of breast carcinoma is noted. Sections demonstrate an infiltrative population of epithelioid cells arranged in nests and cords within the dermis. These atypical cells displays irregular nuclear contours and moderate nuclear pleomorphism consistent with carcinoma.  No definitive involvement of the overlying epidermis is identified. Focal features suspicious for dermal lymphovascular invasion are seen.    Immunohistochemical studies were performed. These neoplastic cells show positive staining for CK7 and GATA3.    Overall, these changes are consistent with an invasive carcinoma. The observed morphologic and immunohistochemical findings favor breast primary origin. Correlation with clinical and imagine findings is advised.    ASSESSMENT & PLAN:  Assessment:  An 89 y.o. female who unfortunately has recurrent left breast cancer.  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  She also has a history of hormone positive DCIS, status post a left breast lumpectomy in 2015.  In clinic today, I went over all of her CT scan images with her, for which she could see that she does not have any evidence of metastatic disease.  Understandably, the patient was pleased with her scan results.  I will refer her to Dr. Bert, who will perform a left breast mastectomy.  I will tentatively see her back in 1 month to go over the surgical pathology from this upcoming surgery, which will be used to formulate her next course of action.  With respect to her iron  deficiency anemia that has been related to cecal AVMs, she will continue to receive octreotide  every 4 weeks.  The patient understands all the plans discussed today and is in agreement with them.  Marilyn Demont DELENA Kerns, MD     "

## 2024-01-25 NOTE — Telephone Encounter (Signed)
 01/25/24 Daughter called stating patient has been weak and has abn.labs-Per [facility].

## 2024-01-25 NOTE — Progress Notes (Signed)
 "  Marilyn Robinson at [facility] [address] [place],  [place]  [address] [phone]  Clinic Day:  12/15/2023  Referring physician: Silver Lamar LABOR, MD  HISTORY OF PRESENT ILLNESS:  The patient is an 89 y.o. female who was biopsy-proven to have recurrent left breast cancer.  The plan was for her to undergo a completion mastectomy.  However, the patient was admitted in late December 2025 for a recent stroke.  Scans showed evidence of an acute left cerebellar stroke.  She was gravely ill, but fortunately recuperated from her acute CNS episode.  The patient still wishes to have her left mastectomy performed, but she feels she needs additional weeks to recuperate from her recent hospitalization before  undergoing such surgery.  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  She was taking anastrozole  for her adjuvant endocrine therapy.  She was initially diagnosed with hormone positive DCIS, status post a lumpectomy in May 2015.  The patient elected not to undergo adjuvant breast radiation.  She briefly took tamoxifen, but discontinued it due to it causing numerous side effects, including undesirable weight gain and joint discomfort.    Another health issue she has had recently is recurrent iron  deficiency anemia secondary to GI blood loss.  She recently received IV iron  in mid September 2025 as labs showed recurrent iron  deficiency anemia.  The patient is also taking octreotide  to minimize recurrent GI blood loss.  Since this has been started, the patient denies having additional episodes of GI blood loss.  Furthermore, her hemoglobin has progressively risen over time.  Of note, the patient had both a colonoscopy and EGD for her recurrent iron  deficiency anemia in June 2025.  Her colonoscopy revealed 8 AVMs in her cecum that were ablated.    VITALS:  Blood pressure (!) 155/90, pulse (!) 102,  temperature 98.3 F (36.8 C), temperature source Oral, resp. rate 14, height 5' 6.5 (1.689 m), weight 140 lb 4.8 oz (63.6 kg), SpO2 95%.  Wt Readings from Last 3 Encounters:  01/26/24 140 lb 4.8 oz (63.6 kg)  12/15/23 137 lb 8 oz (62.4 kg)  12/08/23 137 lb 1.6 oz (62.2 kg)    Body mass index is 22.31 kg/m.  Performance status (ECOG): 1 - Symptomatic but completely ambulatory  PHYSICAL EXAM:  Physical Exam Constitutional:      Appearance: Normal appearance.     Comments: She is ambulating with a walker; she is weak, but is in good spirits.  HENT:     Mouth/Throat:     Pharynx: Oropharynx is clear. No oropharyngeal exudate.  Cardiovascular:     Rate and Rhythm: Normal rate and regular rhythm.     Heart sounds: No murmur heard.    No friction rub. No gallop.  Pulmonary:     Breath sounds: Normal breath sounds.  Chest:  Breasts:    Right: No swelling, bleeding, inverted nipple, mass, nipple discharge or skin change.     Left: Mass (A 3 cm lesion is medial and under her left breast.  There appears to be a small subcentimeter satellite nodule just lateral to it.  No significant change since her last visit) present. No swelling, bleeding, inverted nipple, nipple discharge or skin change.  Abdominal:     General: Bowel sounds are normal. There is no distension.     Palpations: Abdomen is soft. There is no mass.  Tenderness: There is no abdominal tenderness.  Musculoskeletal:        General: No tenderness.     Cervical back: Normal range of motion and neck supple.     Right lower leg: No edema.     Left lower leg: No edema.  Lymphadenopathy:     Cervical: No cervical adenopathy.     Right cervical: No superficial, deep or posterior cervical adenopathy.    Left cervical: No superficial, deep or posterior cervical adenopathy.     Upper Body:     Right upper body: No supraclavicular or axillary adenopathy.     Left upper body: No supraclavicular or axillary adenopathy.     Lower  Body: No right inguinal adenopathy. No left inguinal adenopathy.  Skin:    Coloration: Skin is not jaundiced.     Findings: No lesion or rash.  Neurological:     General: No focal deficit present.     Mental Status: She is alert and oriented to person, place, and time. Mental status is at baseline.  Psychiatric:        Mood and Affect: Mood normal.        Behavior: Behavior normal.        Thought Content: Thought content normal.        Judgment: Judgment normal.   LABS:   Latest Reference Range & Units 01/26/24 11:10 01/26/24 11:11  Sodium 135 - 145 mmol/L 137   Potassium 3.5 - 5.1 mmol/L 3.5   Chloride 98 - 111 mmol/L 105   CO2 22 - 32 mmol/L 19 (L)   Glucose 70 - 99 mg/dL 778 (H)   BUN 8 - 23 mg/dL 23   Creatinine 9.55 - 1.00 mg/dL 8.55 (H)   Calcium  8.9 - 10.3 mg/dL 7.9 (L)   Anion gap 5 - 15  14   Alkaline Phosphatase 38 - 126 U/L 86   Albumin 3.5 - 5.0 g/dL 3.5   AST 15 - 41 U/L 20   ALT 0 - 44 U/L 8   Total Protein 6.5 - 8.1 g/dL 7.0   Total Bilirubin 0.0 - 1.2 mg/dL 0.6   GFR, Est Non African American >60 mL/min 35 (L)   Iron  28 - 170 ug/dL  35  UIBC ug/dL  790  TIBC 749 - 549 ug/dL  755 (L)  Saturation Ratios 10.4 - 31.8 %  14  Ferritin 11 - 307 ng/mL  239  Folate >5.9 ng/mL 11.7   Vitamin B12 180 - 914 pg/mL 1,414 (H)   WBC 4.0 - 10.5 K/uL 7.6   RBC 3.87 - 5.11 MIL/uL 3.27 (L)   Hemoglobin 12.0 - 15.0 g/dL 89.9 (L)   HCT 63.9 - 53.9 % 30.8 (L)   MCV 80.0 - 100.0 fL 94.2   MCH 26.0 - 34.0 pg 30.6   MCHC 30.0 - 36.0 g/dL 67.4   RDW 88.4 - 84.4 % 16.3 (H)   Platelets 150 - 400 K/uL 245   nRBC 0.0 - 0.2 % 0.0   Neutrophils % 83   Lymphocytes % 8   Monocytes Relative % 8   Eosinophil % 1   Basophil % 0   Immature Granulocytes % 0   NEUT# 1.7 - 7.7 K/uL 6.3   Lymphs Abs 0.7 - 4.0 K/uL 0.6 (L)   Monocyte # 0.1 - 1.0 K/uL 0.6   Eosinophils Absolute 0.0 - 0.5 K/uL 0.1   Basophils Absolute 0.0 - 0.1 K/uL 0.0   Abs Immature Granulocytes 0.00 -  0.07 K/uL 0.01    (L): Data is abnormally low (H): Data is abnormally high  ASSESSMENT & PLAN:  Assessment:  An 89 y.o. female with recurrent left breast cancer.  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  She also has a history of hormone positive DCIS, status post a left breast lumpectomy in 2015.  It appears the patient still wishes to undergo a completion left mastectomy, but just needs a few more weeks to recuperate from her hospitalization before doing this.   I will make Dr. Bert aware of this.  With respect to her iron  deficiency anemia, her hemoglobin is not dangerously low today.  Furthermore, her iron  parameters today are not consistent with iron  deficiency.  For now, her iron  and hemoglobin will continue to be followed conservatively.  With respect to her  cecal AVMs, she will continue to receive octreotide  every 4 weeks, with her next dose to be given on January 21st.  I will see her back in 9 weeks.  Hopefully, by then, she will have undergone her completion left mastectomy.  The patient understands all the plans discussed today and is in agreement with them.  Kaitrin Seybold DELENA Kerns, MD     "

## 2024-01-26 ENCOUNTER — Inpatient Hospital Stay: Admitting: Oncology

## 2024-01-26 ENCOUNTER — Encounter: Payer: Self-pay | Admitting: Oncology

## 2024-01-26 ENCOUNTER — Telehealth: Payer: Self-pay

## 2024-01-26 ENCOUNTER — Inpatient Hospital Stay

## 2024-01-26 VITALS — BP 155/90 | HR 102 | Temp 98.3°F | Resp 14 | Ht 66.5 in | Wt 140.3 lb

## 2024-01-26 DIAGNOSIS — C50312 Malignant neoplasm of lower-inner quadrant of left female breast: Secondary | ICD-10-CM

## 2024-01-26 DIAGNOSIS — D508 Other iron deficiency anemias: Secondary | ICD-10-CM

## 2024-01-26 DIAGNOSIS — K5521 Angiodysplasia of colon with hemorrhage: Secondary | ICD-10-CM | POA: Diagnosis present

## 2024-01-26 DIAGNOSIS — Z170 Estrogen receptor positive status [ER+]: Secondary | ICD-10-CM

## 2024-01-26 DIAGNOSIS — D500 Iron deficiency anemia secondary to blood loss (chronic): Secondary | ICD-10-CM | POA: Diagnosis present

## 2024-01-26 DIAGNOSIS — Z8673 Personal history of transient ischemic attack (TIA), and cerebral infarction without residual deficits: Secondary | ICD-10-CM | POA: Diagnosis not present

## 2024-01-26 DIAGNOSIS — C50912 Malignant neoplasm of unspecified site of left female breast: Secondary | ICD-10-CM | POA: Diagnosis present

## 2024-01-26 LAB — FERRITIN: Ferritin: 239 ng/mL (ref 11–307)

## 2024-01-26 LAB — CMP (CANCER CENTER ONLY)
ALT: 8 U/L (ref 0–44)
AST: 20 U/L (ref 15–41)
Albumin: 3.5 g/dL (ref 3.5–5.0)
Alkaline Phosphatase: 86 U/L (ref 38–126)
Anion gap: 14 (ref 5–15)
BUN: 23 mg/dL (ref 8–23)
CO2: 19 mmol/L — ABNORMAL LOW (ref 22–32)
Calcium: 7.9 mg/dL — ABNORMAL LOW (ref 8.9–10.3)
Chloride: 105 mmol/L (ref 98–111)
Creatinine: 1.44 mg/dL — ABNORMAL HIGH (ref 0.44–1.00)
GFR, Estimated: 35 mL/min — ABNORMAL LOW
Glucose, Bld: 221 mg/dL — ABNORMAL HIGH (ref 70–99)
Potassium: 3.5 mmol/L (ref 3.5–5.1)
Sodium: 137 mmol/L (ref 135–145)
Total Bilirubin: 0.6 mg/dL (ref 0.0–1.2)
Total Protein: 7 g/dL (ref 6.5–8.1)

## 2024-01-26 LAB — CBC WITH DIFFERENTIAL (CANCER CENTER ONLY)
Abs Immature Granulocytes: 0.01 K/uL (ref 0.00–0.07)
Basophils Absolute: 0 K/uL (ref 0.0–0.1)
Basophils Relative: 0 %
Eosinophils Absolute: 0.1 K/uL (ref 0.0–0.5)
Eosinophils Relative: 1 %
HCT: 30.8 % — ABNORMAL LOW (ref 36.0–46.0)
Hemoglobin: 10 g/dL — ABNORMAL LOW (ref 12.0–15.0)
Immature Granulocytes: 0 %
Lymphocytes Relative: 8 %
Lymphs Abs: 0.6 K/uL — ABNORMAL LOW (ref 0.7–4.0)
MCH: 30.6 pg (ref 26.0–34.0)
MCHC: 32.5 g/dL (ref 30.0–36.0)
MCV: 94.2 fL (ref 80.0–100.0)
Monocytes Absolute: 0.6 K/uL (ref 0.1–1.0)
Monocytes Relative: 8 %
Neutro Abs: 6.3 K/uL (ref 1.7–7.7)
Neutrophils Relative %: 83 %
Platelet Count: 245 K/uL (ref 150–400)
RBC: 3.27 MIL/uL — ABNORMAL LOW (ref 3.87–5.11)
RDW: 16.3 % — ABNORMAL HIGH (ref 11.5–15.5)
WBC Count: 7.6 K/uL (ref 4.0–10.5)
nRBC: 0 % (ref 0.0–0.2)

## 2024-01-26 LAB — VITAMIN B12: Vitamin B-12: 1414 pg/mL — ABNORMAL HIGH (ref 180–914)

## 2024-01-26 LAB — IRON AND TIBC
Iron: 35 ug/dL (ref 28–170)
Saturation Ratios: 14 % (ref 10.4–31.8)
TIBC: 244 ug/dL — ABNORMAL LOW (ref 250–450)
UIBC: 209 ug/dL

## 2024-01-26 LAB — FOLATE: Folate: 11.7 ng/mL

## 2024-01-26 NOTE — Telephone Encounter (Signed)
 Patient called to see if she needed iron  based on her lab results today. Andrez Foy PAC reviewed and no iron  needed at this time. Patient voiced understanding.

## 2024-02-02 ENCOUNTER — Inpatient Hospital Stay

## 2024-02-02 VITALS — BP 130/58 | HR 96 | Temp 98.0°F | Resp 18 | Ht 66.5 in

## 2024-02-02 DIAGNOSIS — D500 Iron deficiency anemia secondary to blood loss (chronic): Secondary | ICD-10-CM

## 2024-02-02 DIAGNOSIS — C50912 Malignant neoplasm of unspecified site of left female breast: Secondary | ICD-10-CM | POA: Diagnosis not present

## 2024-02-02 DIAGNOSIS — Q2730 Arteriovenous malformation, site unspecified: Secondary | ICD-10-CM

## 2024-02-02 DIAGNOSIS — D508 Other iron deficiency anemias: Secondary | ICD-10-CM

## 2024-02-02 MED ORDER — OCTREOTIDE ACETATE 20 MG IM KIT
20.0000 mg | PACK | Freq: Once | INTRAMUSCULAR | Status: AC
  Administered 2024-02-02: 20 mg via INTRAMUSCULAR
  Filled 2024-02-02: qty 1

## 2024-03-01 ENCOUNTER — Inpatient Hospital Stay

## 2024-03-01 ENCOUNTER — Inpatient Hospital Stay: Admitting: Oncology

## 2024-03-29 ENCOUNTER — Inpatient Hospital Stay

## 2024-03-29 ENCOUNTER — Inpatient Hospital Stay: Admitting: Oncology
